# Patient Record
Sex: Female | Born: 1965
Health system: Southern US, Community
[De-identification: ages and names within clinical notes are randomized; demographics above are authoritative.]

## PROBLEM LIST (undated history)

## (undated) DIAGNOSIS — Q782 Osteopetrosis: Secondary | ICD-10-CM

## (undated) DIAGNOSIS — I10 Essential (primary) hypertension: Secondary | ICD-10-CM

## (undated) DIAGNOSIS — R011 Cardiac murmur, unspecified: Secondary | ICD-10-CM

## (undated) DIAGNOSIS — I209 Angina pectoris, unspecified: Secondary | ICD-10-CM

## (undated) DIAGNOSIS — Z8709 Personal history of other diseases of the respiratory system: Secondary | ICD-10-CM

## (undated) DIAGNOSIS — F329 Major depressive disorder, single episode, unspecified: Secondary | ICD-10-CM

## (undated) DIAGNOSIS — F419 Anxiety disorder, unspecified: Secondary | ICD-10-CM

## (undated) DIAGNOSIS — F32A Depression, unspecified: Secondary | ICD-10-CM

## (undated) DIAGNOSIS — K219 Gastro-esophageal reflux disease without esophagitis: Secondary | ICD-10-CM

## (undated) DIAGNOSIS — R911 Solitary pulmonary nodule: Secondary | ICD-10-CM

## (undated) HISTORY — DX: Depression, unspecified: F32.A

## (undated) HISTORY — DX: Osteopetrosis: Q78.2

## (undated) HISTORY — DX: Anxiety disorder, unspecified: F41.9

## (undated) HISTORY — DX: Solitary pulmonary nodule: R91.1

## (undated) HISTORY — DX: Major depressive disorder, single episode, unspecified: F32.9

---

## 1999-04-30 ENCOUNTER — Emergency Department (HOSPITAL_COMMUNITY): Admission: EM | Admit: 1999-04-30 | Discharge: 1999-04-30 | Payer: Self-pay | Admitting: Emergency Medicine

## 2000-01-11 ENCOUNTER — Emergency Department (HOSPITAL_COMMUNITY): Admission: EM | Admit: 2000-01-11 | Discharge: 2000-01-12 | Payer: Self-pay | Admitting: Emergency Medicine

## 2000-01-16 ENCOUNTER — Encounter: Payer: Self-pay | Admitting: Family Medicine

## 2000-01-16 ENCOUNTER — Encounter: Admission: RE | Admit: 2000-01-16 | Discharge: 2000-01-16 | Payer: Self-pay | Admitting: Family Medicine

## 2005-11-26 HISTORY — PX: ABDOMINAL HYSTERECTOMY: SHX81

## 2006-11-06 ENCOUNTER — Ambulatory Visit (HOSPITAL_COMMUNITY): Admission: RE | Admit: 2006-11-06 | Discharge: 2006-11-07 | Payer: Self-pay | Admitting: Obstetrics and Gynecology

## 2006-11-06 ENCOUNTER — Encounter (INDEPENDENT_AMBULATORY_CARE_PROVIDER_SITE_OTHER): Payer: Self-pay | Admitting: *Deleted

## 2009-01-09 ENCOUNTER — Emergency Department (HOSPITAL_BASED_OUTPATIENT_CLINIC_OR_DEPARTMENT_OTHER): Admission: EM | Admit: 2009-01-09 | Discharge: 2009-01-10 | Payer: Self-pay | Admitting: Emergency Medicine

## 2009-01-10 ENCOUNTER — Ambulatory Visit: Payer: Self-pay | Admitting: Diagnostic Radiology

## 2009-01-19 ENCOUNTER — Ambulatory Visit: Payer: Self-pay | Admitting: Diagnostic Radiology

## 2009-01-19 ENCOUNTER — Ambulatory Visit (HOSPITAL_BASED_OUTPATIENT_CLINIC_OR_DEPARTMENT_OTHER): Admission: RE | Admit: 2009-01-19 | Discharge: 2009-01-19 | Payer: Self-pay | Admitting: Family Medicine

## 2009-11-26 HISTORY — PX: LAPAROSCOPIC GASTRIC BANDING: SHX1100

## 2011-03-13 LAB — CBC
HCT: 39.8 % (ref 36.0–46.0)
Hemoglobin: 13.3 g/dL (ref 12.0–15.0)
MCHC: 33.3 g/dL (ref 30.0–36.0)
MCV: 87.1 fL (ref 78.0–100.0)
Platelets: 296 10*3/uL (ref 150–400)
RBC: 4.57 MIL/uL (ref 3.87–5.11)
RDW: 12.2 % (ref 11.5–15.5)
WBC: 5.3 10*3/uL (ref 4.0–10.5)

## 2011-03-13 LAB — BASIC METABOLIC PANEL
BUN: 12 mg/dL (ref 6–23)
CO2: 29 mEq/L (ref 19–32)
Calcium: 9 mg/dL (ref 8.4–10.5)
Chloride: 104 mEq/L (ref 96–112)
Creatinine, Ser: 0.6 mg/dL (ref 0.4–1.2)
GFR calc Af Amer: >60 mL/min (ref >60)
GFR calc non Af Amer: >60 mL/min (ref >60)
Glucose, Bld: 98 mg/dL (ref 70–99)
Potassium: 4 mEq/L (ref 3.5–5.1)
Sodium: 140 mEq/L (ref 135–145)

## 2011-03-13 LAB — DIFFERENTIAL
Basophils Absolute: 0.1 K/uL (ref 0.0–0.1)
Basophils Relative: 1 % (ref 0–1)
Eosinophils Absolute: 0.1 10*3/uL (ref 0.0–0.7)
Eosinophils Relative: 3 % (ref 0–5)
Lymphocytes Relative: 32 % (ref 12–46)
Lymphs Abs: 1.7 10*3/uL (ref 0.7–4.0)
Monocytes Absolute: 0.8 K/uL (ref 0.1–1.0)
Monocytes Relative: 15 % — ABNORMAL HIGH (ref 3–12)
Neutro Abs: 2.6 K/uL (ref 1.7–7.7)
Neutrophils Relative %: 49 % (ref 43–77)

## 2011-03-13 LAB — POCT CARDIAC MARKERS
CKMB, poc: 2.1 ng/mL (ref 1.0–8.0)
Myoglobin, poc: 56.2 ng/mL (ref 12–200)
Troponin i, poc: <0.05 ng/mL (ref 0.00–0.09)

## 2011-03-13 LAB — D-DIMER, QUANTITATIVE: D-Dimer, Quant: 0.29 ug{FEU}/mL (ref 0.00–0.48)

## 2011-04-13 NOTE — Op Note (Signed)
NAME:  Debra Carpenter, Debra Carpenter NO.:  [id number]   MEDICAL RECORD NO.:  [id number]          PATIENT TYPE:  AMB   LOCATION:  SDC                           FACILITY:  [facility]   PHYSICIAN:  Lenoard Aden, M.D.DATE OF BIRTH:  05-06-1966   DATE OF PROCEDURE:  11/06/2006  DATE OF DISCHARGE:                               OPERATIVE REPORT   PREOPERATIVE DIAGNOSIS:  Dysmenorrhea and menorrhagia, symptomatic  uterine fibroids.   POSTOPERATIVE DIAGNOSIS:  Dysmenorrhea and menorrhagia, symptomatic  uterine fibroids, enterocele, pelvic endometriosis.   OPERATION PERFORMED:  Diagnostic laparoscopy, ablation of cul-de-sac  endometriosis, laparoscopically assisted vaginal hysterectomy, McCall  culdoplasty.   SURGEON:  Lenoard Aden, M.D.   ANESTHESIA:  General by Raul Del, M.D.   ASSESSMENT:  Debra Carpenter. [name], M.D.   ESTIMATED BLOOD LOSS:  300 mL.   COMPLICATIONS:  None.   SPECIMENS:  Uterus and cervix to pathology.   DESCRIPTION OF PROCEDURE:  After being apprised of risks of anesthesia  to include bleeding, injury to abdominal organs, need for repair,  delayed versus immediate complications to include bowel or bladder  injury, inability to perform single procedure laparoscopically with  possible need for abdominal hysterectomy, she was brought to the  operating room where she was administered general anesthetic without  complications.  Feet were placed in the yellow fin stirrups without  difficulty.  Prepped and draped in the usual sterile fashion.  The RUMI  retractor was placed without difficulty and the occlusion balloon is  inflated.  At this time infraumbilical incision made with a scalpel.  Veress needle placed.  Opening pressure __________ 4 L CO2 insufflated  without difficulty.  Trocar placed atraumatically.  Atraumatic trocar  entry.  Visualized normal liver, gallbladder, normal appendiceal area.  Fibroid laden uterus noted.  Normal tubes and normal  ovaries.  At this  time two 5 mm ports were made in the midaxillary line suprapubically,  the right and left mid lower quadrant areas under direct visualization.  At this time the Gyrus was entered and the tubo-ovarian round ligaments  were bilaterally ligated using the Gyrus.  The bladder flap was  developed using sharp dissection and with the Gyrus, the posterior cul-  de-sac is laden with some pelvic endometriosis which was ablated using  bipolar cautery.  At this time the uterine vessels were bilaterally  skeletonized and ligated using the Gyrus and the anterior colpotomy  incision was made using a Gyrus spatula without difficulty in the  midline.  Posterior colpotomy incision was made as well.  Attention was  turned to the vaginal portion of the procedure where a weighted speculum  was placed.  Anterior and posterior lip of the cervix was grasped using  a Jacobs tenaculum.  The posterior colpotomy was extended and  uterosacral ligaments bilaterally grasped and suture ligated, transfixed  to the vaginal cuff.  The Ligasure was used to ligate the cardinal and  broad ligament complexes.  The specimen was then removed and weight was  greater than 250 gm.  At this time irrigation was accomplished and the  enterocele was  identified.  Internal and external McCall's culdoplasty  suture was placed and tied.  Uterosacral ligaments were plicated in the  midline.  Vaginal cuff was closed side-to-side using 0 Vicryl pop-off  sutures.  Good hemostasis was noted.  Attention was turned to the  abdominal portion of the procedure whereby visualization reveals well  approximated vaginal cuff.  No evidence of any bleeding.  The ureters  were seen to be peristalsing bilaterally.  Two normal ovaries were  identified.  At this time the procedure was terminated.  CO2 was  released.  All instruments were removed under direct visualization.  Incisions were closed using 0 Vicryl and Dermabond.  The patient   tolerated the procedure well and was transferred to recovery in good  condition.      Lenoard Aden, M.D.  Electronically Signed     RJT/MEDQ  D:  11/06/2006  T:  11/06/2006  Job:  [id number]

## 2011-04-13 NOTE — H&P (Signed)
NAME:  Debra Carpenter, Debra Carpenter NO.:  [id number]   MEDICAL RECORD NO.:  [id number]          PATIENT TYPE:  AMB   LOCATION:  SDC                           [facility]ILITY[facility]   PHYSICIAN:  Lenoard Aden, M.D.DATE OF BIRTH:  04-23-1966   DATE OF ADMISSION:  11/06/2006  DATE OF DISCHARGE:                              HISTORY & PHYSICAL   CHIEF COMPLAINT:  Dysmenorrhea/menorrhagia refractory to medication.   She is a 45 year old African American female G3, P3 who presents with  symptomatic fibroids, menorrhagia and dysmenorrhea for definitive  therapy.  She has no known drug allergies.  Nonsmoker, nondrinker.  Denies domestic physical violence.  Family history of diabetes, heart  disease, hypertension, ovarian cancer and colon cancer.  She denies  domestic physical violence.   PHYSICAL EXAM:  She is a well-developed, well-nourished [place] American  female in no acute distress.  HEENT:  Normal.  LUNGS:  Clear.  HEART:  Regular rate and rhythm.  ABDOMEN:  Soft and nontender.  PELVIC EXAM:  Reveals a bulky midposition uterus.  No adnexal masses.  EXTREMITIES:  No cords.  NEUROLOGICAL:  Nonfocal.   IMPRESSION:  Symptomatic uterine fibroids with menorrhagia,  dysmenorrhea.  To proceed with definitive therapy.   PLAN:  Proceed with LAVH versus TAH.  Ovarian conservation discussed.  Risks of anesthesia, infection, bleeding, injury to abdominal organs,  need for repair noted.  Delayed versus immediate complications to  include bowel and bladder injury discussed.  The patient acknowledges  and wishes to proceed.      Lenoard Aden, M.D.  Electronically Signed     RJT/MEDQ  D:  11/05/2006  T:  11/05/2006  Job:  [id number]   cc:   Ma Hillock

## 2011-07-28 DIAGNOSIS — I209 Angina pectoris, unspecified: Secondary | ICD-10-CM

## 2011-07-28 HISTORY — DX: Angina pectoris, unspecified: I20.9

## 2011-08-14 ENCOUNTER — Emergency Department (INDEPENDENT_AMBULATORY_CARE_PROVIDER_SITE_OTHER): Payer: BC Managed Care – PPO

## 2011-08-14 ENCOUNTER — Inpatient Hospital Stay (HOSPITAL_COMMUNITY)
Admission: AD | Admit: 2011-08-14 | Discharge: 2011-08-16 | DRG: 102 | Disposition: A | Discharge status: Home / Self Care | Payer: BC Managed Care – PPO | Source: Other Acute Inpatient Hospital | Attending: Family Medicine | Admitting: Family Medicine

## 2011-08-14 ENCOUNTER — Emergency Department (HOSPITAL_BASED_OUTPATIENT_CLINIC_OR_DEPARTMENT_OTHER)
Admission: EM | Admit: 2011-08-14 | Discharge: 2011-08-14 | Disposition: A | Discharge status: Other Acute Inpatient Hospital | Payer: BC Managed Care – PPO | Source: Home / Self Care | Attending: Emergency Medicine | Admitting: Emergency Medicine

## 2011-08-14 ENCOUNTER — Other Ambulatory Visit: Payer: Self-pay

## 2011-08-14 ENCOUNTER — Encounter: Payer: Self-pay | Admitting: *Deleted

## 2011-08-14 DIAGNOSIS — K219 Gastro-esophageal reflux disease without esophagitis: Secondary | ICD-10-CM | POA: Insufficient documentation

## 2011-08-14 DIAGNOSIS — R791 Abnormal coagulation profile: Secondary | ICD-10-CM

## 2011-08-14 DIAGNOSIS — R079 Chest pain, unspecified: Secondary | ICD-10-CM

## 2011-08-14 DIAGNOSIS — R0789 Other chest pain: Secondary | ICD-10-CM | POA: Diagnosis present

## 2011-08-14 DIAGNOSIS — M19019 Primary osteoarthritis, unspecified shoulder: Secondary | ICD-10-CM | POA: Diagnosis present

## 2011-08-14 DIAGNOSIS — M25819 Other specified joint disorders, unspecified shoulder: Secondary | ICD-10-CM | POA: Diagnosis present

## 2011-08-14 DIAGNOSIS — R599 Enlarged lymph nodes, unspecified: Secondary | ICD-10-CM | POA: Insufficient documentation

## 2011-08-14 DIAGNOSIS — E669 Obesity, unspecified: Secondary | ICD-10-CM | POA: Diagnosis present

## 2011-08-14 DIAGNOSIS — Z9884 Bariatric surgery status: Secondary | ICD-10-CM

## 2011-08-14 DIAGNOSIS — I498 Other specified cardiac arrhythmias: Secondary | ICD-10-CM | POA: Diagnosis present

## 2011-08-14 DIAGNOSIS — R030 Elevated blood-pressure reading, without diagnosis of hypertension: Secondary | ICD-10-CM | POA: Diagnosis present

## 2011-08-14 DIAGNOSIS — M67919 Unspecified disorder of synovium and tendon, unspecified shoulder: Secondary | ICD-10-CM | POA: Diagnosis present

## 2011-08-14 DIAGNOSIS — M719 Bursopathy, unspecified: Secondary | ICD-10-CM | POA: Diagnosis present

## 2011-08-14 DIAGNOSIS — R591 Generalized enlarged lymph nodes: Secondary | ICD-10-CM

## 2011-08-14 DIAGNOSIS — R0602 Shortness of breath: Secondary | ICD-10-CM

## 2011-08-14 DIAGNOSIS — Z79899 Other long term (current) drug therapy: Secondary | ICD-10-CM

## 2011-08-14 DIAGNOSIS — M940 Chondrocostal junction syndrome [Tietze]: Principal | ICD-10-CM | POA: Diagnosis present

## 2011-08-14 DIAGNOSIS — M899 Disorder of bone, unspecified: Secondary | ICD-10-CM | POA: Insufficient documentation

## 2011-08-14 HISTORY — DX: Gastro-esophageal reflux disease without esophagitis: K21.9

## 2011-08-14 HISTORY — DX: Essential (primary) hypertension: I10

## 2011-08-14 LAB — CARDIAC PANEL(CRET KIN+CKTOT+MB+TROPI)
CK, MB: 3 ng/mL (ref 0.3–4.0)
CK, MB: 2.3 ng/mL (ref 0.3–4.0)
Relative Index: 2.4 (ref 0.0–2.5)
Relative Index: 2.1 (ref 0.0–2.5)
Total CK: 117 U/L (ref 7–177)
Total CK: 111 U/L (ref 7–177)
Troponin I: <0.3 ng/mL (ref <0.30)

## 2011-08-14 LAB — BASIC METABOLIC PANEL
BUN: 14 mg/dL (ref 6–23)
CO2: 27 mEq/L (ref 19–32)
Chloride: 102 mEq/L (ref 96–112)
Creatinine, Ser: 0.5 mg/dL (ref 0.50–1.10)
Glucose, Bld: 104 mg/dL — ABNORMAL HIGH (ref 70–99)
Potassium: 3.8 mEq/L (ref 3.5–5.1)

## 2011-08-14 LAB — CBC
HCT: 37.4 % (ref 36.0–46.0)
Hemoglobin: 12.8 g/dL (ref 12.0–15.0)
MCV: 84.8 fL (ref 78.0–100.0)
WBC: 5.9 10*3/uL (ref 4.0–10.5)

## 2011-08-14 LAB — D-DIMER, QUANTITATIVE: D-Dimer, Quant: 0.89 ug/mL-FEU — ABNORMAL HIGH (ref 0.00–0.48)

## 2011-08-14 LAB — CEA: CEA: 0.5 ng/mL (ref 0.0–5.0)

## 2011-08-14 LAB — HEMOGLOBIN A1C: Mean Plasma Glucose: 114 mg/dL (ref <117)

## 2011-08-14 MED ORDER — NITROGLYCERIN 2 % TD OINT
0.5000 [in_us] | TOPICAL_OINTMENT | Freq: Once | TRANSDERMAL | Status: AC
  Administered 2011-08-14: 0.5 [in_us] via TOPICAL
  Filled 2011-08-14: qty 1

## 2011-08-14 MED ORDER — KETOROLAC TROMETHAMINE 30 MG/ML IJ SOLN
30.0000 mg | Freq: Once | INTRAMUSCULAR | Status: AC
  Administered 2011-08-14: 30 mg via INTRAVENOUS
  Filled 2011-08-14: qty 1

## 2011-08-14 MED ORDER — SODIUM CHLORIDE 0.9 % IV BOLUS (SEPSIS)
500.0000 mL | Freq: Once | INTRAVENOUS | Status: AC
  Administered 2011-08-14: 500 mL via INTRAVENOUS

## 2011-08-14 MED ORDER — NITROGLYCERIN 0.4 MG SL SUBL
0.4000 mg | SUBLINGUAL_TABLET | SUBLINGUAL | Status: DC | PRN
  Administered 2011-08-14 (×2): 0.4 mg via SUBLINGUAL
  Filled 2011-08-14: qty 25

## 2011-08-14 MED ORDER — GI COCKTAIL ~~LOC~~
30.0000 mL | Freq: Once | ORAL | Status: AC
  Administered 2011-08-14: 30 mL via ORAL
  Filled 2011-08-14: qty 30

## 2011-08-14 MED ORDER — IOHEXOL 350 MG/ML SOLN
80.0000 mL | Freq: Once | INTRAVENOUS | Status: AC | PRN
  Administered 2011-08-14: 80 mL via INTRAVENOUS

## 2011-08-14 MED ORDER — ASPIRIN 81 MG PO CHEW
324.0000 mg | CHEWABLE_TABLET | Freq: Once | ORAL | Status: AC
  Administered 2011-08-14: 324 mg via ORAL
  Filled 2011-08-14: qty 4

## 2011-08-14 MED FILL — Nitroglycerin SL Tab 0.4 MG: SUBLINGUAL | Qty: 25 | Status: AC

## 2011-08-14 NOTE — ED Provider Notes (Addendum)
History     CSN: [id number] Arrival date & time: 08/14/2011  2:48 AM   Chief Complaint  Patient presents with  . Chest Pain     (Include location/radiation/quality/duration/timing/severity/associated sxs/prior treatment) Patient is a 45 y.o. Carpenter presenting with chest pain. The history is provided by the patient. No language interpreter was used.  Chest Pain The chest pain began 3 - 5 hours ago. Duration of episode(s) is 3 hours. Chest pain occurs constantly. The chest pain is improving. Associated with: none, patient was watching TV when it started. At its most intense, the pain is at 10/10. The pain is currently at 8/10. The severity of the pain is severe. The quality of the pain is described as tightness. The pain radiates to the left neck and right neck. Exacerbated by: nothing. Primary symptoms include shortness of breath, cough, nausea and dizziness. Pertinent negatives for primary symptoms include no fever, no fatigue, no syncope, no wheezing, no palpitations, no abdominal pain and no altered mental status.  Dizziness also occurs with nausea and diaphoresis. Dizziness does not occur with weakness.   Associated symptoms include diaphoresis.  Pertinent negatives for associated symptoms include no claudication, no lower extremity edema, no near-syncope, no numbness, no orthopnea, no paroxysmal nocturnal dyspnea and no weakness. She tried nothing for the symptoms. Risk factors include obesity and post-menopausal (drove to [facility] for a 10 k this past weekend).  Pertinent negatives for past medical history include no aneurysm, no anxiety/panic attacks, no aortic aneurysm, no aortic dissection, no arrhythmia, no bicuspid aortic valve, no CAD, no cancer, no congenital heart disease, no connective tissue disease, no COPD, no CHF, no diabetes, no DVT, no hyperhomocysteinemia, no hyperlipidemia, no hypertension, no Kawasaki disease, no Marfan's syndrome, no MI, no mitral valve prolapse, no  pacemaker, no PE, no PVD, no recent injury, no rheumatic fever, no seizures, no sickle cell disease, no sleep apnea, no spontaneous pneumothorax, no stimulant use, no strokes, no thyroid problem, no TIA, Turner syndrome and no valve disorder.  Her family medical history is significant for CAD in family.  Pertinent negatives for family medical history include: family history of aortic dissection, no connective tissue disease in family, no early MI in family, no PE in family and no PVD in family.  Procedure history is negative for cardiac catheterization, echocardiogram, persantine thallium, stress echo, stress thallium and exercise treadmill test.      Past Medical History  Diagnosis Date  . GERD (gastroesophageal reflux disease)   . Hypertension      Past Surgical History  Procedure Date  . Laparoscopic gastric banding   . Abdominal hysterectomy     No family history on file.  History  Substance Use Topics  . Smoking status: Never Smoker   . Smokeless tobacco: Not on file  . Alcohol Use: No    OB History    Grav Para Term Preterm Abortions TAB SAB Ect Mult Living                  Review of Systems  Constitutional: Positive for diaphoresis. Negative for fever and fatigue.  HENT: Negative for facial swelling.   Eyes: Negative for discharge.  Respiratory: Positive for cough, chest tightness and shortness of breath. Negative for choking, wheezing and stridor.   Cardiovascular: Positive for chest pain. Negative for palpitations, orthopnea, claudication, syncope and near-syncope.  Gastrointestinal: Positive for nausea. Negative for abdominal pain.  Genitourinary: Negative for difficulty urinating.  Musculoskeletal: Negative for arthralgias.  Skin: Negative.  Neurological: Positive for dizziness. Negative for seizures, weakness and numbness.  Hematological: Negative for adenopathy.  Psychiatric/Behavioral: Negative for agitation and altered mental status.    Allergies    Review of patient's allergies indicates no known allergies.  Home Medications  No current outpatient prescriptions on file.  Physical Exam    BP 155/107  Pulse 58  Temp(Src) 97.9 F (36.6 C) (Oral)  Resp 18  Wt 265 lb (120.203 kg)  SpO2 100%  Physical Exam  Constitutional: She is oriented to person, place, and time. She appears well-developed and well-nourished. No distress.  HENT:  Head: Normocephalic and atraumatic.  Eyes: EOM are normal. Pupils are equal, round, and reactive to light. Right eye exhibits no discharge. Left eye exhibits no discharge.  Neck: Normal range of motion. Neck supple. No JVD present.  Cardiovascular: Normal rate and regular rhythm.  Exam reveals no gallop and no friction rub.   No murmur heard. Pulmonary/Chest: Effort normal and breath sounds normal. No stridor. No respiratory distress.  Abdominal: Soft. Bowel sounds are normal. There is no tenderness. There is no rebound and no guarding.  Musculoskeletal: Normal range of motion. She exhibits no edema and no tenderness.  Neurological: She is alert and oriented to person, place, and time.  Skin: Skin is warm and dry. She is not diaphoretic.  Psychiatric: She has a normal mood and affect.    ED Course  Procedures  Results for orders placed during the hospital encounter of 08/14/11  CBC      Component Value Range   WBC 5.9  4.0 - 10.5 (K/uL)   RBC 4.41  3.87 - 5.11 (MIL/uL)   Hemoglobin 12.8  12.0 - 15.0 (g/dL)   HCT 16.1  09.6 - 04.5 (%)   MCV Debra.8  78.0 - 100.0 (fL)   MCH 29.0  26.0 - 34.0 (pg)   MCHC 34.2  30.0 - 36.0 (g/dL)   RDW 40.9  81.1 - 91.4 (%)   Platelets 275  150 - 400 (K/uL)  BASIC METABOLIC PANEL      Component Value Range   Sodium 137  135 - 145 (mEq/L)   Potassium 3.8  3.5 - 5.1 (mEq/L)   Chloride 102  96 - 112 (mEq/L)   CO2 27  19 - 32 (mEq/L)   Glucose, Bld 104 (*) 70 - 99 (mg/dL)   BUN 14  6 - 23 (mg/dL)   Creatinine, Ser 7.82  0.50 - 1.10 (mg/dL)   Calcium 8.8  8.4  - 10.5 (mg/dL)   GFR calc non Af Amer >60  >60 (mL/min)   GFR calc Af Amer >60  >60 (mL/min)  CARDIAC PANEL(CRET KIN+CKTOT+MB+TROPI)      Component Value Range   Total CK 143  7 - 177 (U/L)   CK, MB 3.4  0.3 - 4.0 (ng/mL)   Troponin I <0.30  <0.30 (ng/mL)   Relative Index 2.4  0.0 - 2.5   D-DIMER, QUANTITATIVE      Component Value Range   D-Dimer, Quant 0.89 (*) 0.00 - 0.48 (ug/mL-FEU)   No results found.   No diagnosis found.   MDM  Date: 08/14/2011  Rate:58  Rhythm: sinus bradycardia  QRS Axis: normal  Intervals: normal  ST/T Wave abnormalities: normal  Conduction Disutrbances:none  Narrative Interpretation: sinus brady  Old EKG Reviewed: unchanged     Patient and family informed of CT chest finding of lymph nodes and sclerotic lesion in the C7 vertebrae and need for PET scan per radiology.  Patient and family verbalize understanding and agree to have follow up  Gisela Lea K Mitsue Peery-Rasch, MD 08/14/11 0509  Case d/w Dr. Angus Palms, CT results reviewed patient has lymphadenopathy and sclerotic C7 lesion and will need follow up PET scan. Expressed understanding of same  Kayle Correa K Shay Bartoli-Rasch, MD 08/14/11 [phone]

## 2011-08-14 NOTE — ED Notes (Signed)
[facility] arrived for transport. Consent signed.

## 2011-08-14 NOTE — ED Notes (Signed)
IV charted "removed" for documentation purposes only. SL remains intact to LAC.

## 2011-08-14 NOTE — ED Notes (Signed)
Report given to Peace RN on 4700.

## 2011-08-14 NOTE — ED Notes (Signed)
C/o midsternal chest tightness that began while sitting down at the computer. Associated with SOB/nausea/dizziness/diaphoresis. The pain the radiated up into her throat. Denies CP at this time but states the tightness is still in her throat. Denies any recent injury but states she did run a 10k 2days ago.

## 2011-08-14 NOTE — ED Notes (Signed)
Pt has hx of GERD. States she ate a meal that was fatty and fried

## 2011-08-15 ENCOUNTER — Inpatient Hospital Stay (HOSPITAL_COMMUNITY): Payer: BC Managed Care – PPO

## 2011-08-15 ENCOUNTER — Telehealth: Payer: Self-pay | Admitting: Cardiology

## 2011-08-15 DIAGNOSIS — I517 Cardiomegaly: Secondary | ICD-10-CM

## 2011-08-15 LAB — BASIC METABOLIC PANEL
BUN: 7 mg/dL (ref 6–23)
CO2: 27 mEq/L (ref 19–32)
GFR calc non Af Amer: >60 mL/min (ref >60)
Glucose, Bld: 97 mg/dL (ref 70–99)
Potassium: 3.4 mEq/L — ABNORMAL LOW (ref 3.5–5.1)
Sodium: 137 mEq/L (ref 135–145)

## 2011-08-15 LAB — CBC
HCT: 35 % — ABNORMAL LOW (ref 36.0–46.0)
MCHC: 34.3 g/dL (ref 30.0–36.0)
Platelets: 239 10*3/uL (ref 150–400)
RDW: 12.8 % (ref 11.5–15.5)

## 2011-08-15 LAB — LIPID PANEL
HDL: 57 mg/dL (ref >39)
LDL Cholesterol: 81 mg/dL (ref 0–99)
Triglycerides: 63 mg/dL (ref <150)
VLDL: 13 mg/dL (ref 0–40)

## 2011-08-15 LAB — DRUGS OF ABUSE SCREEN W/O ALC, ROUTINE URINE
Amphetamine Screen, Ur: NEGATIVE
Opiate Screen, Urine: NEGATIVE

## 2011-08-15 LAB — CARDIAC PANEL(CRET KIN+CKTOT+MB+TROPI): Total CK: 88 U/L (ref 7–177)

## 2011-08-15 NOTE — Telephone Encounter (Signed)
Spoke w/pt. States she was admitted by hospital doctor and a cardiologist is not seeing her. Wanted to know if Dr. Swaziland was aware that she was in hospital. Advised that Dr. [place]and has never seen her because she was a patient of Dr. Reyes Ivan in 2010. Also advised her that if the admitting physician feels that a cardiologist is needed will call [facility] since we are part of them. She verbalizes understanding.

## 2011-08-15 NOTE — Telephone Encounter (Signed)
Pt's sister states pt was seen at [facility] in [facility] and then transferred to [facility].  Pt's sister is concerned that the nurses/physicians are not consulting with Dr. Sw[place]and and don't know what's going on.  The sister is with pt at the hospital and has questions.  Pt's sister states they keep giving pt Vicodin.  Please call back to field medical questions if possible in room # [phone]  or at cell # [phone]   Chart in box.

## 2011-08-17 ENCOUNTER — Ambulatory Visit: Payer: BC Managed Care – PPO | Admitting: Family

## 2011-08-22 ENCOUNTER — Encounter: Payer: Self-pay | Admitting: Family

## 2011-08-22 ENCOUNTER — Ambulatory Visit (HOSPITAL_BASED_OUTPATIENT_CLINIC_OR_DEPARTMENT_OTHER)
Admission: RE | Admit: 2011-08-22 | Discharge: 2011-08-22 | Disposition: A | Discharge status: Home / Self Care | Payer: BC Managed Care – PPO | Source: Ambulatory Visit | Attending: Family | Admitting: Family

## 2011-08-22 ENCOUNTER — Ambulatory Visit (INDEPENDENT_AMBULATORY_CARE_PROVIDER_SITE_OTHER): Payer: BC Managed Care – PPO | Admitting: Family

## 2011-08-22 DIAGNOSIS — Z0000 Encounter for general adult medical examination without abnormal findings: Secondary | ICD-10-CM

## 2011-08-22 DIAGNOSIS — F329 Major depressive disorder, single episode, unspecified: Secondary | ICD-10-CM

## 2011-08-22 DIAGNOSIS — Q782 Osteopetrosis: Secondary | ICD-10-CM

## 2011-08-22 DIAGNOSIS — F32A Depression, unspecified: Secondary | ICD-10-CM | POA: Insufficient documentation

## 2011-08-22 DIAGNOSIS — Z1231 Encounter for screening mammogram for malignant neoplasm of breast: Secondary | ICD-10-CM | POA: Insufficient documentation

## 2011-08-22 DIAGNOSIS — R0789 Other chest pain: Secondary | ICD-10-CM

## 2011-08-22 HISTORY — DX: Osteopetrosis: Q78.2

## 2011-08-22 MED ORDER — CALCIUM CARBONATE-VITAMIN D 600-400 MG-UNIT PO TABS: 1.0000 | ORAL_TABLET | Freq: Two times a day (BID) | ORAL | Status: DC

## 2011-08-22 NOTE — Assessment & Plan Note (Signed)
Pt was counseled on diet, exercise and weight loss.   She is up to date on her GYN exam, will order mammogram.  Fasting lab work reviewed from the hospital and is stable.  Offered flu shot, pt declines.

## 2011-08-22 NOTE — Progress Notes (Signed)
Subjective:    Patient ID: Debra Carpenter, female    DOB: [birth year], 45 y.o.   MRN: [id number]  HPI  Debra Carpenter is a 45 yr old female new to establish care. Was hospitalized at [facility] 9/19-9/21 for atypical chest pain.  Work up included a CTA chest which was negative for PE.  She was incidentally noted to have a sclerotic lesion on C7 for which a PET scan was scheduled.    She was also noted to have supraspinatus and infraspinatus tendinopathy and was evaluated by Dr. Shon Baton while hospitalized.    Depression- She reports that this is well controlled, she has been taking for the last 2 months.   Mom is [employer].   Preventative- runs 4-5 miles several days a week. Diet "ok". Notes that she eats infrequently throughout the day.  She reports that she is up to date on her tetanus.  Declines flu shot.   Review of Systems  Constitutional: Negative for fever and unexpected weight change.  HENT: Negative for hearing loss.   Eyes: Negative for visual disturbance.  Respiratory: Negative for shortness of breath.   Cardiovascular: Negative for chest pain and palpitations.  Gastrointestinal: Negative for nausea, vomiting and diarrhea.  Genitourinary: Negative for dysuria.  Musculoskeletal: Positive for back pain. Negative for arthralgias.  Skin: Negative for rash.  Neurological: Negative for headaches.  Hematological: Negative for adenopathy.  Psychiatric/Behavioral:       See HPI   Past Medical History  Diagnosis Date  . GERD (gastroesophageal reflux disease)   . Hypertension   . Anxiety and depression     History   Social History  . Marital Status: Married    Spouse Name: N/A    Number of Children: 3  . Years of Education: N/A   Occupational History  .  [employer]   Social History Main Topics  . Smoking status: Never Smoker   . Smokeless tobacco: Not on file  . Alcohol Use: No  . Drug Use: No  . Sexually Active: Not Currently   Other Topics Concern  . 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  3 children- 33 daughter, 83 son, 4 daughter    Past Surgical History  Procedure Date  . Laparoscopic gastric banding   . Abdominal hysterectomy 2007    Family History  Problem Relation Age of Onset  . Hypertension Mother   . Hypertension Father   . Cancer Father     ? colon cancer  . Cancer Maternal Grandmother     ovarian cancer  . Hypertension Maternal Grandfather   . Hypertension Paternal Grandfather     No Known Allergies  No current outpatient prescriptions on file prior to visit.    BP 112/88  Pulse 72  Temp(Src) 97.8 F (36.6 C) (Oral)  Resp 16  Ht 5\' 9"  (1.753 m)  Wt 268 lb 0.6 oz (121.582 kg)  BMI 39.58 kg/m2       Objective:   Physical Exam  Constitutional: She is oriented to person, place, and time. She appears well-developed and well-nourished.  HENT:  Head: Normocephalic and atraumatic.  Eyes: Conjunctivae are normal. Pupils are equal, round, and reactive to light.  Neck: Normal range of motion. Neck supple. No thyromegaly present.  Cardiovascular: Normal rate and regular rhythm.   Pulmonary/Chest: Effort normal and breath sounds normal. No respiratory distress. She  has no wheezes. She has no rales. She exhibits no tenderness.  Abdominal: Soft. Bowel sounds are normal. She exhibits no distension and no mass. There is no tenderness. There is no rebound and no guarding.  Musculoskeletal: Normal range of motion. She exhibits no edema.  Neurological: She is alert and oriented to person, place, and time. She displays normal reflexes. No cranial nerve deficit. Coordination normal.  Skin: Skin is warm and dry. No rash noted. No erythema. No pallor.  Psychiatric: She has a normal mood and affect. Her behavior is normal. Judgment and thought content normal.          Assessment & Plan:

## 2011-08-22 NOTE — Patient Instructions (Addendum)
Keep your calories to 1200 a day. Continue your regular exercise. Schedule your mammogram on the first floor. Welcome to [facility]!

## 2011-08-22 NOTE — Assessment & Plan Note (Signed)
She reports that this was left anterior chest wall tenderness which has resolved with NSAIDS. Also, she denies current left shoulder pain.  Hospitalization record was reviewed and cardiac enzymes were negative.  Work up was consistent with costrochondritis.

## 2011-08-22 NOTE — Assessment & Plan Note (Signed)
This is stable, continue prozac.

## 2011-08-22 NOTE — Assessment & Plan Note (Signed)
Incidental finding on CTA chest of C7 sclerosis and mildly prominent hilar lymph nodes.  She is scheduled for a PET scan tomorrow and I have advised her to keep this appointment.

## 2011-08-23 ENCOUNTER — Encounter (HOSPITAL_COMMUNITY)
Admit: 2011-08-23 | Discharge: 2011-08-23 | Disposition: A | Discharge status: Home / Self Care | Payer: BC Managed Care – PPO | Attending: Internal Medicine | Admitting: Internal Medicine

## 2011-08-23 ENCOUNTER — Encounter (HOSPITAL_COMMUNITY): Payer: Self-pay

## 2011-08-23 DIAGNOSIS — M5030 Other cervical disc degeneration, unspecified cervical region: Secondary | ICD-10-CM | POA: Insufficient documentation

## 2011-08-23 DIAGNOSIS — J984 Other disorders of lung: Secondary | ICD-10-CM | POA: Insufficient documentation

## 2011-08-23 DIAGNOSIS — M5137 Other intervertebral disc degeneration, lumbosacral region: Secondary | ICD-10-CM | POA: Insufficient documentation

## 2011-08-23 DIAGNOSIS — N9489 Other specified conditions associated with female genital organs and menstrual cycle: Secondary | ICD-10-CM | POA: Insufficient documentation

## 2011-08-23 DIAGNOSIS — M51379 Other intervertebral disc degeneration, lumbosacral region without mention of lumbar back pain or lower extremity pain: Secondary | ICD-10-CM | POA: Insufficient documentation

## 2011-08-23 DIAGNOSIS — Z90710 Acquired absence of both cervix and uterus: Secondary | ICD-10-CM | POA: Insufficient documentation

## 2011-08-23 DIAGNOSIS — R599 Enlarged lymph nodes, unspecified: Secondary | ICD-10-CM | POA: Insufficient documentation

## 2011-08-23 LAB — GLUCOSE, CAPILLARY: Glucose-Capillary: 98 mg/dL (ref 70–99)

## 2011-08-23 MED ORDER — FLUDEOXYGLUCOSE F - 18 (FDG) INJECTION
15.6000 | Freq: Once | INTRAVENOUS | Status: AC | PRN
  Administered 2011-08-23: 15.6 via INTRAVENOUS

## 2011-08-23 NOTE — H&P (Signed)
Debra Carpenter, Debra Carpenter NO.:  [id number]  MEDICAL RECORD NO.:  [id number]  LOCATION:  4733                         FACILITY:  [facility]  PHYSICIAN:  Bernadette Hoit, MD     DATE OF BIRTH:  12/02/1965  DATE OF ADMISSION:  08/14/2011 DATE OF DISCHARGE:                             HISTORY & PHYSICAL   PRIMARY CARE PHYSICIAN:  Bernadette Hoit, MD  CHIEF COMPLAINT:  Chest pain.  HISTORY OF PRESENT ILLNESS:  This is a 45 year old with no significant past medical history who presented to the emergency department complaining of chest pain.  The pain has started the morning of admission around 1 a.m.  She described the pain as a tightness and pressure on her chair, squeezing in quality.  She was given GI cocktail that did not improved significantly her pain.  Subsequently she was given nitroglycerin x2 which relieved her chest pain.  She denies any chest pain or shortness of breath on exertion.  She had a prior episode of chest pain but not as severe that is resolved by its own.  She was sitting in her bed when she started to have the chest pain.  No fevers, chills, night sweats, weight loss.  She related that on Sunday she ran a 10k, she did not have any symptoms.  ALLERGIES:  No known drug allergies.  PAST MEDICAL HISTORY:  None.  PAST SURGICAL HISTORY:  Hysterectomy, lap band.  SOCIAL HISTORY:  Denies smoking alcohol or recreational drugs.  She is a Office manager.  She is married.  She has 3 children, 24, 37, and 92 years old.  Her mother has history of coronary artery disease and had 3 stent, first stent was when she was 73 years old.  Her father died of colon cancer.  He was diagnosed at 67 years old.  Her grandmother has history of ovarian cancer.  REVIEW OF SYSTEMS:  No cough.  No fever.  No chills.  No melena.  No hematochezia.  PHYSICAL EXAMINATION:  VITAL SIGNS:  Blood pressure 143/75, respirations 16, pulse 59, temperature 98.6, sat  98 on room air. GENERAL:  Obese in no acute distress. HEENT:  Head atraumatic, normocephalic.  Eyes anicteric.  Pupils equal and reactive to light.  Extraocular muscles intact. CARDIOVASCULAR:  S1 and S2, regular rhythm and rate.  No rubs, murmurs or gallops. LUNGS:  Bilateral good air movement.  No wheezing, crackles or rhonchi. ABDOMEN:  Bowel sounds present, soft, nontender, nondistended.  No rigidity. EXTREMITIES:  No edema. NEURO:  Nonfocal. BREAST:  No mass palpated.  ADMISSION LABORATORY FINDINGS:  Hemoglobin 12.8, white blood cell 5.9, platelets 275.  Sodium 137, potassium 3.8, chloride 102, bicarb 27, glucose 104, BUN 14, creatinine 0.50.  First set of cardiac enzymes, troponin 0.30, CK-MB 3.4.  EKG, sinus bradycardia.  CT show no pulmonary embolism or acute intrathoracic process.  She has a mildly prominent right hilar and subcarinal lymph node.  A sclerotic appearance  of C7 vertebral bodies.  This findings are nonspecific with benign and aggressive condition including the differential.  The diagnosis at this time must be excluded are metastatic disease and lymphoma.  Therefore a followup chest  CT is recommended.  ASSESSMENT AND PLAN: 1. Chest pain.  The patient was admitted to rule out acute coronary     syndrome.  We will cycle cardiac enzymes, EKG.  We will check 2-D     echo, rate is 35, fasting lipid panel, hemoglobin A1c.     Nitroglycerin p.r.n., aspirin.  I will start Protonix incase that     is GI source of her chest pain. 2. Incidental finding of CT with mildly prominent subcarinal node and     hilar lymph node and a sclerotic appearance of C7 vertebral body.     The patient will need a workup to rule out malignancy.  We will     order a CEA and an ACE level.  I will guaiac stool.  She will need     a screening appropriate for age.  She will need mammogram,     colonoscopy.  She has history of colon cancer in her family.  She     will need also a PET scan.   The patient is aware of CT results and     she understood that she needs to have a workup to rule out     malignancy. 3. Hypertension.  We will order p.r.n. hydralazine.  Monitor blood     pressure and consider medication if blood pressure continued to be     elevated.  She did not have any prior history of hypertension.     Hartley Barefoot, MD   ______________________________ Bernadette Hoit, MD    BR/MEDQ  D:  08/14/2011  T:  08/15/2011  Job:  [id number]  cc:   Bernadette Hoit, MD  Electronically Signed by Hartley Barefoot MD on 08/23/2011 08:12:18 PM

## 2011-08-24 ENCOUNTER — Telehealth: Payer: Self-pay | Admitting: Family

## 2011-08-24 ENCOUNTER — Encounter: Payer: Self-pay | Admitting: Family

## 2011-08-24 DIAGNOSIS — R911 Solitary pulmonary nodule: Secondary | ICD-10-CM

## 2011-08-24 DIAGNOSIS — Q782 Osteopetrosis: Secondary | ICD-10-CM

## 2011-08-24 HISTORY — DX: Solitary pulmonary nodule: R91.1

## 2011-08-24 NOTE — Assessment & Plan Note (Signed)
Had PET scan 9/12- neg for hypermetabolic activity.

## 2011-08-24 NOTE — Telephone Encounter (Signed)
Reviewed PET scan, neg for hypermetabolic activity.  Called pt, reviewed PET scan results. Discussed lung nodule. She will need follow up CT chest in 3-6 mos.

## 2011-08-27 NOTE — Discharge Summary (Signed)
Debra Carpenter, Debra Carpenter NO.:  [id number]  MEDICAL RECORD NO.:  [id number]  LOCATION:                                 FACILITY:  PHYSICIAN:  Pleas Koch, MD        DATE OF BIRTH:  1966-08-01  DATE OF ADMISSION:  08/15/2011 DATE OF DISCHARGE:  08/17/2011                              DISCHARGE SUMMARY   CURRENT FACILITY:  [facility].  DISCHARGE DIAGNOSES: 1. Noncardiogenic chest pain. 2. Likely costochondritis. 3. Rotator cuff impingement. 4. Acromioclavicular joint arthritis and bursitis. 5. Supraspinatus and infraspinatus tendinopathy, worse in the aspect     of the supraspinatus, no focal tear seen. 6. Elevated blood pressure without a diagnosis of hypertension.     Actually, the patient now has stage II hypertension. 7. CT abnormality noted. 8. Mildly prominent right hilar and subcarinal lymph nodes - scheduled     for follow-up CT scan August 23, 2011.  DISCHARGE MEDICATIONS: 1. Zofran 4 mg one tablet q.6 hourly p.r.n., 30 tablets. 2. MiraLax 17 grams daily. 3. Robaxin 500 mg one tablet t.i.d. 4. Percocet one tablet q.6 hourly, quantity sufficient for one tablet     q.6 hourly p.r.n. for pain 5/325, 30 tablets prescribed. 5. Aleve has been discontinued from [facility]. 6. Ibuprofen 600 mg one tablet t.i.d., 24 tablets prescribed. 7. Zyrtec 5 mg one tablet daily.  PERTINENT IMAGING STUDIES: 1. Chest x-ray, two-view, August 14, 2011 showed no acute     cardiopulmonary process. 2. CT angiogram of chest showed;     a.     No acute pulmonary embolism or acute intrathoracic process.     b.     Mildly prominent right hilar subcarinal lymph nodes with      sclerotic appearance of C7.     c.     These findings are nonspecific with benign aggressive      conditions include in differential, think they must be excluded      are metastatic disease and lymphoma.  Therefore, PET scan has been      scheduled. 3. Left shoulder x-ray, August 15, 2011  showed negative. 4. MRI shoulder showed;     a.     Supraspinatus and infraspinatus tendinopathy, changes      appeared to be worsened, anterior aspect of supraspinatus, but no      focal tear seen.     b.     Glenohumeral degenerative disease.  Paralabral cyst along      the posterior marginal glenoid indicative of labral tear, although      no tears seen on the study.     c.     Acromioclavicular osteoarthritis creating some mass effect      on supraspinatus.  PERTINENT CONSULTS:  Dr. Venita Lick.  Briefly, this is a very pleasant 45 year old female, presented to emergency room August 15, 2011 with chest pain, starting around 1 a.m.  She described as tightness in the chest, squeezing quality.  She was given GI cocktail which did not help.  Took two nitroglycerin which helped.  She has had prior episodes, but these resolved on their own. She was sitting in  bed and started to have the chest pain.  She ran 10K on Sunday and experienced no other issues subsequent to that other than this new onset chest pain.  VITALS ON ADMISSION:  Blood pressure 143/75, respirations 16, pulse 59, temperature 98.6.  PERTINENT ADMISSION LABS:  Hemoglobin 12.8, white cell count 5.9, platelets 275.  Cardiac enzymes were negative.  EKG showed sinus bradycardia.  HOSPITAL COURSE:  According to issue: 1. Chest pain.  It was thought that the pain was eventually     noncardiogenic.  On exam on day #2 of her admission, she had some     tender left AC joint tenderness and tenderness in the left     bicipital groove and it was not possible to actually do the Neer's     or Hawkins test on her, however, Dr. Gary Fleet PA did not see the     patient and I did speak personally with Dr. Shon Baton from orthopedics     regarding the same.  The patient should follow up with Dr. Penni Bombard     with [facility] in 7-10 days for further issues.  The     patient may benefit from physical therapy, occupational  therapy for     rehab exercises. 2. CT abnormality.  The patient was found to have a CT abnormality     with possible reactive lymph nodes versus malignancy and she has     had a workup in the hospital inclusive of a negative CEA and ACE     level.  She will get the PET scan done and probably proceed from     there to determine what further course would be.  She is aware that     this might in fact the surrogate for cancer and we will follow     along and see how she does. 3. Elevated blood pressure without a diagnosis of hypertension.  The     patient had blood pressures in the high 130s to mid 140s systolic     over 90s.  She may benefit from dietary modification and weight     loss as well as low-salt diet and I have encouraged her to contact     her primary care physician Dr. Peggyann Juba for further workup which     is not urgent at this time. 4. Bradycardia.  This patient was asymptomatic.  She had TSH which     essentially ruled this out as TSH was normal.  The patient was seen on day of discharge, was doing very well.  Her pain in the shoulder had decreased from 10/10 to 7/10.  She is tolerating a full diet, had no nausea, no vomiting, no other issues.  Temperature 97.6, pulse 49, respirations 18, blood pressure 130-143 over 87-89, sats 97% on room air.  I have discussed fully with the patient the plan of care and she understands the need for followup.  It was pleasure taking care of this patient.          ______________________________ Pleas Koch, MD     JS/MEDQ  D:  08/16/2011  T:  08/16/2011  Job:  [id number]  cc:   Sandford Craze, NP Alvy Beal, MD Dr. Penni Bombard  Electronically Signed by Pleas Koch MD on 08/27/2011 03:46:35 PM

## 2011-08-28 ENCOUNTER — Ambulatory Visit: Payer: BC Managed Care – PPO | Admitting: Family

## 2011-08-29 NOTE — H&P (Signed)
  NAMEKENNY, STERN NO.:  [id number]  MEDICAL RECORD NO.:  [id number]  LOCATION:  4741                         FACILITY:  [facility]  PHYSICIAN:  Alvy Beal, MD    DATE OF BIRTH:  08-13-1966  DATE OF ADMISSION:  08/14/2011 DATE OF DISCHARGE:                             HISTORY & PHYSICAL   PRIMARY CARE PHYSICIAN:  Bernadette Hoit, MD  CHIEF COMPLAINT:  Left shoulder pain.  HISTORY OF PRESENT ILLNESS:  This is a 45 year old African American female otherwise healthy who presented to the emergency department with chest pain on August 14, 2011.  She reports running a 10K this past Sunday.  After appropriate workup, a GI cocktail was administered which failed to alleviate her pain.  Subsequently, she was given nitro type 2 which relieved the pain.  During the hospital stay, she complained of left shoulder pain.  Dr. Shon Baton, orthopedic surgeon on call was consulted to evaluate left shoulder pain.  MRI and x-rays of the left shoulder were ordered.  The patient also states that she has anterior left-sided neck and throat pain.  She denies difficulty swallowing.  ALLERGIES:  No known drug allergies.  PAST MEDICAL HISTORY:  Unremarkable.  SURGICAL HISTORY:  Status post hysterectomy, status post lap band.  SOCIAL HISTORY:  Denies alcohol, denies tobacco, denies illicit drug use.  REVIEW OF SYSTEMS:  No chest pain, no shortness of breath, no cough, no fever, no chills.  MUSCULOSKELETAL:  Tenderness with palpation over the left AC joint.  Positive pain with passive range of motion of the left shoulder.  Tenderness to palpation over the sternoclavicular joint on the left.  No pain with cervical range of motion.  Neurovascular status intact in bilateral upper extremities.  Upper extremity pulses are equal and symmetric.  IMAGING:  MRI of the left shoulder demonstrates: 1. Supraspinatus and infraspinatus tendinopathy.  Changes appear worse     in the anterior  aspect of the supraspinatus with no focal tears or     pain. 2. Glenohumeral degenerative disease.  Paralabral cyst along the     posterior margin of the glenoid is indicative of a labral tear     although no tear is seen on this study. 3. Acromioclavicular osteoarthritis creates some mass effect on the     supraspinatus.  X-RAYS:  X-rays taken today at the left shoulder by report are negative.  ASSESSMENT AND PLAN: 1. Left shoulder pain.  Follow up with Dr. Melrose Nakayama at [facility] for outpatient management of the     left shoulder in 7-10 days after discharge. 2. Left-sided neck pain, recommend ENT evaluation.  Also recommend     neck x-rays.  We will be happy to reevaluate the patient once these     are done.    ______________________________ Norval Gable, PA   ______________________________ Alvy Beal, MD    DK/MEDQ  D:  08/15/2011  T:  08/16/2011  Job:  [id number]  Electronically Signed by Norval Gable PA on 08/22/2011 07:31:19 AM Electronically Signed by Venita Lick MD on 08/29/2011 08:18:13 AM

## 2011-09-13 ENCOUNTER — Telehealth: Payer: Self-pay | Admitting: *Deleted

## 2011-09-13 NOTE — Telephone Encounter (Signed)
Records received from Dr Riley Nearing and forwarded to Provider for review.

## 2011-09-18 ENCOUNTER — Encounter: Payer: Self-pay | Admitting: Family

## 2011-10-08 ENCOUNTER — Other Ambulatory Visit: Payer: Self-pay | Admitting: Internal Medicine

## 2011-11-26 ENCOUNTER — Other Ambulatory Visit (HOSPITAL_BASED_OUTPATIENT_CLINIC_OR_DEPARTMENT_OTHER): Payer: BC Managed Care – PPO

## 2011-11-26 ENCOUNTER — Telehealth: Payer: Self-pay | Admitting: Family

## 2011-11-26 DIAGNOSIS — R911 Solitary pulmonary nodule: Secondary | ICD-10-CM

## 2011-11-26 NOTE — Telephone Encounter (Signed)
Spoke with pt. She is agreeable to proceed with follow up CT.  Myriam Jacobson, could you please arrange CT? thanks

## 2011-12-21 ENCOUNTER — Telehealth: Payer: Self-pay | Admitting: Family

## 2011-12-21 NOTE — Telephone Encounter (Signed)
Patient is scheduled for CT  Chest   Jan  28th    Seeing you for result  Tuesday  Jan  29  @ 1:15pm

## 2011-12-24 ENCOUNTER — Ambulatory Visit (HOSPITAL_BASED_OUTPATIENT_CLINIC_OR_DEPARTMENT_OTHER)
Admission: RE | Admit: 2011-12-24 | Discharge: 2011-12-24 | Disposition: A | Discharge status: Home / Self Care | Payer: BC Managed Care – PPO | Source: Ambulatory Visit | Attending: Family | Admitting: Family

## 2011-12-24 DIAGNOSIS — R911 Solitary pulmonary nodule: Secondary | ICD-10-CM

## 2011-12-24 DIAGNOSIS — I517 Cardiomegaly: Secondary | ICD-10-CM

## 2011-12-24 DIAGNOSIS — J984 Other disorders of lung: Secondary | ICD-10-CM | POA: Insufficient documentation

## 2011-12-25 ENCOUNTER — Ambulatory Visit (INDEPENDENT_AMBULATORY_CARE_PROVIDER_SITE_OTHER): Payer: BC Managed Care – PPO | Admitting: Family

## 2011-12-25 ENCOUNTER — Encounter: Payer: Self-pay | Admitting: Family

## 2011-12-25 VITALS — BP 140/88 | HR 52 | Temp 98.2°F | Resp 16 | Ht 69.0 in | Wt 268.0 lb

## 2011-12-25 DIAGNOSIS — J309 Allergic rhinitis, unspecified: Secondary | ICD-10-CM | POA: Insufficient documentation

## 2011-12-25 DIAGNOSIS — B9689 Other specified bacterial agents as the cause of diseases classified elsewhere: Secondary | ICD-10-CM

## 2011-12-25 DIAGNOSIS — J984 Other disorders of lung: Secondary | ICD-10-CM

## 2011-12-25 DIAGNOSIS — N760 Acute vaginitis: Secondary | ICD-10-CM

## 2011-12-25 DIAGNOSIS — R911 Solitary pulmonary nodule: Secondary | ICD-10-CM

## 2011-12-25 DIAGNOSIS — A499 Bacterial infection, unspecified: Secondary | ICD-10-CM

## 2011-12-25 MED ORDER — CETIRIZINE HCL 10 MG PO TABS: 10.0000 mg | ORAL_TABLET | Freq: Every day | ORAL | Status: DC

## 2011-12-25 NOTE — Assessment & Plan Note (Signed)
She is on Tindamax per GYN.

## 2011-12-25 NOTE — Assessment & Plan Note (Addendum)
Stable. Pt is requesting a prescription for her zyrtec so that she can use her benefit card. [facility]

## 2011-12-25 NOTE — Progress Notes (Signed)
Subjective:    Patient ID: Debra Carpenter, female    DOB: [birth year], 46 y.o.   MRN: [id number]  HPI  Ms.  Adrian Blackwater is a 46 yr old female who presents today for follow up of her CT.  1) Lung nodule- initially seen on PET scan in September 2012.  She had a follow up CT which showed mild enlargement of this ground glass opacity.   2) Sinus congestion-  She has been using zyrtec, but notes that if she misses a dose she breaks out in hives,   3) Bacterial Vaginosis- Reports that she saw Dr. Billy Coast and started a prescription for tindamax.      Review of Systems See HPI  Past Medical History  Diagnosis Date  . GERD (gastroesophageal reflux disease)   . Hypertension   . Anxiety and depression   . Lung nodule 08/24/2011  . Bony sclerosis 08/22/2011    History   Social History  . Marital Status: Married    Spouse Name: N/A    Number of Children: 3  . Years of Education: N/A   Occupational History  .  [employer]   Social History Main Topics  . Smoking status: Never Smoker   . Smokeless tobacco: Not on file  . Alcohol Use: No  . Drug Use: No  . Sexually Active: Not Currently   Other Topics Concern  . 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  3 children- 72 daughter, 92 son, 35 daughter    Past Surgical History  Procedure Date  . Laparoscopic gastric banding   . Abdominal hysterectomy 2007    Family History  Problem Relation Age of Onset  . Hypertension Mother   . Hypertension Father   . Cancer Father     ? colon cancer  . Cancer Maternal Grandmother     ovarian cancer  . Hypertension Maternal Grandfather   . Hypertension Paternal Grandfather     No Known Allergies  Current Outpatient Prescriptions on File Prior to Visit  Medication Sig Dispense Refill  . Calcium Carbonate-Vitamin D (CALTRATE 600+D) 600-400 MG-UNIT per tablet Take 1  tablet by mouth 2 (two) times daily.      Marland Kitchen FLUoxetine (PROZAC) 20 MG capsule Take 30 mg by mouth Daily.        BP 140/88  Pulse 52  Temp(Src) 98.2 F (36.8 C) (Oral)  Resp 16  Ht 5\' 9"  (1.753 m)  Wt 268 lb 0.6 oz (121.582 kg)  BMI 39.58 kg/m2  SpO2 99%       Objective:   Physical Exam  Constitutional: She is oriented to person, place, and time. She appears well-developed and well-nourished. No distress.  HENT:  Head: Normocephalic and atraumatic.  Right Ear: Tympanic membrane and ear canal normal.  Left Ear: Tympanic membrane normal. Decreased hearing is noted.  Mouth/Throat: No posterior oropharyngeal edema or posterior oropharyngeal erythema.  Cardiovascular: Normal rate and regular rhythm.   No murmur heard. Pulmonary/Chest: Effort normal and breath sounds normal. No respiratory distress. She has no wheezes. She has no rales. She exhibits no tenderness.  Musculoskeletal: She exhibits no edema.  Neurological: She is alert and oriented to person, place, and time. No cranial nerve deficit.  Skin: Skin is warm and dry. No rash noted. No erythema. No pallor.  Psychiatric: She has a normal mood and affect. Her behavior is normal. Judgment  and thought content normal.          Assessment & Plan:

## 2011-12-25 NOTE — Assessment & Plan Note (Signed)
Slightly enlarged on CT. Will refer to pulmonary for further evaluation.

## 2011-12-25 NOTE — Patient Instructions (Signed)
Please follow up in 6 months, sooner if needed.  

## 2012-01-03 ENCOUNTER — Ambulatory Visit (INDEPENDENT_AMBULATORY_CARE_PROVIDER_SITE_OTHER): Payer: BC Managed Care – PPO | Admitting: Critical Care Medicine

## 2012-01-03 ENCOUNTER — Encounter: Payer: Self-pay | Admitting: Critical Care Medicine

## 2012-01-03 VITALS — BP 130/76 | HR 65 | Temp 98.2°F | Ht 68.0 in | Wt 269.0 lb

## 2012-01-03 DIAGNOSIS — R911 Solitary pulmonary nodule: Secondary | ICD-10-CM

## 2012-01-03 NOTE — Patient Instructions (Signed)
Repeat CT Chest in 6months, I will call with results No other changes for now

## 2012-01-03 NOTE — Assessment & Plan Note (Signed)
Small .7mm ground glass nodule in RLL without attendant lymphadenopathy.  Doubt is malignant. Neg PET scan. No active pulmonary complaints Plan Repeat CT Chest in 6 months. No need for any biopsy at this time No change in medications at this time

## 2012-01-03 NOTE — Progress Notes (Signed)
Subjective:    Patient ID: Debra Carpenter, female    DOB: 1966/05/25, 46 y.o.   MRN: [id number]  HPI  46 y.o.AAF  RLL ground glass nodule  This pt had a sudden onset of chest pain ,  Came to ED CXR ? Abn>>CT scan showed the nodule in RLL. F/u scan 12/12: sl larger.  Never smoker.  Pain went away. Admitted to hosp and w/u for heart was normal.  No cough.  No dyspnea.  No wheezing.  No edema in feet. No exposure hx.  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    No TB exposure.   Past Medical History  Diagnosis Date  . GERD (gastroesophageal reflux disease)   . Hypertension   . Anxiety and depression   . Lung nodule 08/24/2011  . Bony sclerosis 08/22/2011     Family History  Problem Relation Age of Onset  . Hypertension Mother   . Hypertension Father   . Cancer Father     ? colon cancer  . Cancer Maternal Grandmother     ovarian cancer  . Hypertension Maternal Grandfather   . Hypertension Paternal Grandfather      History   Social History  . Marital Status: Married    Spouse Name: N/A    Number of Children: 3  . Years of Education: N/A   Occupational History  .  [employer]   Social History Main Topics  . Smoking status: Never Smoker   . Smokeless tobacco: Never Used  . Alcohol Use: No  . Drug Use: No  . Sexually Active: Not Currently   Other Topics Concern  . 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  3 children- 19 daughter, 12 son, 49 daughter     No Known Allergies   Outpatient Prescriptions Prior to Visit  Medication Sig Dispense Refill  . Calcium Carbonate-Vitamin D (CALTRATE 600+D) 600-400 MG-UNIT per tablet Take 1 tablet by mouth 2 (two) times daily.      . cetirizine (ZYRTEC) 10 MG tablet Take 1 tablet (10 mg total) by mouth daily.  30 tablet  11  . FLUoxetine (PROZAC) 20 MG capsule Take 30 mg by mouth Daily.      Marland Kitchen tinidazole (TINDAMAX) 500 MG tablet  Take 1,000 mg by mouth daily with breakfast.          Review of Systems  Constitutional: Negative for fever, chills, diaphoresis, activity change, appetite change, fatigue and unexpected weight change.  HENT: Positive for sinus pressure. Negative for hearing loss, ear pain, nosebleeds, congestion, sore throat, facial swelling, rhinorrhea, sneezing, mouth sores, trouble swallowing, neck pain, neck stiffness, dental problem, voice change, postnasal drip, tinnitus and ear discharge.   Eyes: Negative for photophobia, discharge, itching and visual disturbance.  Respiratory: Negative for apnea, cough, choking, chest tightness, shortness of breath, wheezing and stridor.   Cardiovascular: Negative for chest pain, palpitations and leg swelling.  Gastrointestinal: Negative for nausea, vomiting, abdominal pain, constipation, blood in stool and abdominal distention.  Genitourinary: Negative for dysuria, urgency, frequency, hematuria, flank pain, decreased urine volume and difficulty urinating.  Musculoskeletal: Negative for myalgias, back pain, joint swelling, arthralgias and gait problem.  Skin: Negative for color change, pallor and rash.  Neurological: Positive for headaches. Negative for dizziness, tremors, seizures, syncope, speech difficulty, weakness, light-headedness and numbness.  Hematological: Negative for adenopathy. Bruises/bleeds easily.  Psychiatric/Behavioral: Negative for confusion, sleep disturbance  and agitation. The patient is not nervous/anxious.        Objective:   Physical Exam Filed Vitals:   01/03/12 1540  BP: 130/76  Pulse: 65  Temp: 98.2 F (36.8 C)  TempSrc: Oral  Height: 5\' 8"  (1.727 m)  Weight: 269 lb (122.018 kg)  SpO2: 99%    Gen: Pleasant, well-nourished, in no distress,  normal affect  ENT: No lesions,  mouth clear,  oropharynx clear, no postnasal drip  Neck: No JVD, no TMG, no carotid bruits  Lungs: No use of accessory muscles, no dullness to percussion,  clear without rales or rhonchi  Cardiovascular: RRR, heart sounds normal, no murmur or gallops, no peripheral edema  Abdomen: soft and NT, no HSM,  BS normal  Musculoskeletal: No deformities, no cyanosis or clubbing  Neuro: alert, non focal  Skin: Warm, no lesions or rashes     CT Chest 9/12: 7mm ground glass nodule RLL  PET Scan 9/12 1. Examination is negative for hypermetabolic adenopathy. 2. 7 mm ground-glass nodule in the right lower lobe. follow-up CT at 3-6 months.  CT Chest 12/24/11: No real change in RLL ground glass nodule No LAN     Assessment & Plan:   Lung nodule Small .7mm ground glass nodule in RLL without attendant lymphadenopathy.  Doubt is malignant. Neg PET scan. No active pulmonary complaints Plan Repeat CT Chest in 6 months. No need for any biopsy at this time No change in medications at this time    Updated Medication List Outpatient Encounter Prescriptions as of 01/03/2012  Medication Sig Dispense Refill  . Calcium Carbonate-Vitamin D (CALTRATE 600+D) 600-400 MG-UNIT per tablet Take 1 tablet by mouth 2 (two) times daily.      . cetirizine (ZYRTEC) 10 MG tablet Take 1 tablet (10 mg total) by mouth daily.  30 tablet  11  . FLUoxetine (PROZAC) 20 MG capsule Take 30 mg by mouth Daily.      Marland Kitchen DISCONTD: tinidazole (TINDAMAX) 500 MG tablet Take 1,000 mg by mouth daily with breakfast.

## 2012-01-09 ENCOUNTER — Institutional Professional Consult (permissible substitution): Payer: BC Managed Care – PPO | Admitting: Critical Care Medicine

## 2012-02-27 ENCOUNTER — Telehealth: Payer: Self-pay | Admitting: *Deleted

## 2012-02-27 NOTE — Telephone Encounter (Signed)
Patient has scheduled follow up with Dr Rodena Medin for 02/28/2012 @ 8:15 am. Patient is wanting to know if there is something that she could take over the counter for acid reflux. She state she has felt like she has had a knot in her throat since  yesterday evening. She stated  last night she has had shortness of breath and gagging sensation, burning sensation in her left arm, and today she states around 11 am she had cramping in her back. She states that she has never experienced these symptoms before. She states she can eat and drink with no problem.   She was informed recommendation of over the counter could not be advised due to her current symptoms.Patient was advised due to her back, and arm pain with shortness of breath she should be evaluated in the emergency room. Patient has verbalized understanding.

## 2012-02-28 ENCOUNTER — Ambulatory Visit: Payer: BC Managed Care – PPO | Admitting: Internal Medicine

## 2012-02-28 DIAGNOSIS — Z0289 Encounter for other administrative examinations: Secondary | ICD-10-CM

## 2012-03-10 ENCOUNTER — Telehealth: Payer: Self-pay | Admitting: Family

## 2012-03-10 MED ORDER — CETIRIZINE HCL 10 MG PO TABS: 10.0000 mg | ORAL_TABLET | Freq: Every day | ORAL | Status: DC

## 2012-03-10 MED ORDER — FLUOXETINE HCL 20 MG PO TABS: 30.0000 mg | ORAL_TABLET | Freq: Every day | ORAL | Status: DC

## 2012-03-10 NOTE — Telephone Encounter (Signed)
Patient states that she was seen about a month ago and was told that two [facility] were [facility] [address] The pharmacy told her that they never received those refills.  Zyrtec Fluoxetine

## 2012-03-10 NOTE — Telephone Encounter (Signed)
Spoke with pt, she states she never filled the written rx of zyrtec and thought we were going to call the refills in at her last appt in January. Advised pt I will send them in today. Pt was recommended f/u in July and has not scheduled appt yet, she states she will call us back to schedule appt.

## 2012-05-23 ENCOUNTER — Other Ambulatory Visit (HOSPITAL_BASED_OUTPATIENT_CLINIC_OR_DEPARTMENT_OTHER): Payer: BC Managed Care – PPO

## 2012-06-23 ENCOUNTER — Ambulatory Visit (HOSPITAL_BASED_OUTPATIENT_CLINIC_OR_DEPARTMENT_OTHER)
Admission: RE | Admit: 2012-06-23 | Discharge: 2012-06-23 | Disposition: A | Discharge status: Home / Self Care | Payer: BC Managed Care – PPO | Source: Ambulatory Visit | Attending: Critical Care Medicine | Admitting: Critical Care Medicine

## 2012-06-23 ENCOUNTER — Encounter: Payer: Self-pay | Admitting: Critical Care Medicine

## 2012-06-23 DIAGNOSIS — R911 Solitary pulmonary nodule: Secondary | ICD-10-CM

## 2012-06-23 DIAGNOSIS — Z09 Encounter for follow-up examination after completed treatment for conditions other than malignant neoplasm: Secondary | ICD-10-CM | POA: Insufficient documentation

## 2012-06-23 DIAGNOSIS — J984 Other disorders of lung: Secondary | ICD-10-CM | POA: Insufficient documentation

## 2012-06-23 DIAGNOSIS — M899 Disorder of bone, unspecified: Secondary | ICD-10-CM | POA: Insufficient documentation

## 2012-06-24 NOTE — Progress Notes (Signed)
Quick Note:  Called, spoke with pt. She did receive Dr. Lynelle Doctor msg. Advised CT shows no change in nodule. She will need one more CT Scan of chest without contrast in one year per Dr. Delford Field. She verbalized understanding of these results and recs and voiced no further questions/concerns at this time.   Note: Reminder is in Dr. Lynelle Doctor reminder list to f/u on in 1 yr. ______

## 2013-01-14 ENCOUNTER — Encounter (INDEPENDENT_AMBULATORY_CARE_PROVIDER_SITE_OTHER): Payer: Self-pay | Admitting: General Surgery

## 2013-01-14 ENCOUNTER — Ambulatory Visit (INDEPENDENT_AMBULATORY_CARE_PROVIDER_SITE_OTHER): Payer: BC Managed Care – PPO | Admitting: General Surgery

## 2013-01-14 DIAGNOSIS — I10 Essential (primary) hypertension: Secondary | ICD-10-CM

## 2013-01-14 NOTE — Progress Notes (Signed)
Patient ID: Debra Carpenter, female   DOB: [birth year], 47 y.o.   MRN: [id number]  Chief Complaint  Patient presents with  . Weight Loss Surgery    HPI Debra Carpenter is a 47 y.o. female. This patientpresents for evaluation for possible revisional weight loss surgery. She had the placement of a realize band in 2011 in [place] by Dr. Adolphus Carpenter. She says that she initially lost about 25 pounds but has since regained the weight and has been on additional pounds. She says that she feels no restrictions and to need a full meal without any limitation. She denies any heartburn or cough or vomiting. Her last adjustment was in September of this last year. She says that she doesn't require fluoroscopy for her band adjustment. She says that she runs 3 times per week about 3-4 miles per day of and is actually scheduled to run a half marathon in a few weeks. However despite her working out, she does not seem to be able to lose weight. She says that she has never been too tight and has never had an upper GI following her surgery. She is interested in conversion to a sleeve gastrectomy. HPI  Past Medical History  Diagnosis Date  . GERD (gastroesophageal reflux disease)   . Hypertension   . Anxiety and depression   . Lung nodule 08/24/2011  . Bony sclerosis 08/22/2011    Past Surgical History  Procedure Laterality Date  . Laparoscopic gastric banding    . Abdominal hysterectomy  2007    Family History  Problem Relation Age of Onset  . Hypertension Mother   . Hypertension Father   . Cancer Father     ? colon cancer  . Cancer Maternal Grandmother     ovarian cancer  . Hypertension Maternal Grandfather   . Hypertension Paternal Grandfather     Social History History  Substance Use Topics  . Smoking status: Never Smoker   . Smokeless tobacco: Never Used  . Alcohol Use: No    No Known Allergies  Current Outpatient Prescriptions  Medication Sig Dispense Refill  . cetirizine (ZYRTEC) 10 MG  tablet Take 1 tablet (10 mg total) by mouth daily.  30 tablet  3  . FLUoxetine (PROZAC) 20 MG tablet Take 1.5 tablets (30 mg total) by mouth daily.  45 tablet  3  . losartan (COZAAR) 50 MG tablet       . Calcium Carbonate-Vitamin D (CALTRATE 600+D) 600-400 MG-UNIT per tablet Take 1 tablet by mouth 2 (two) times daily.       No current facility-administered medications for this visit.    Review of Systems Review of Systems All other review of systems negative or noncontributory except as stated in the HPI  Pulse 68, temperature 98.9 F (37.2 C), temperature source Temporal, resp. rate 16, height 5\' 9"  (1.753 m), weight 288 lb 12.8 oz (130.999 kg).  Physical Exam Physical Exam Physical Exam  Nursing note and vitals reviewed. Constitutional: She is oriented to person, place, and time. She appears well-developed and well-nourished. No distress.  HENT:  Head: Normocephalic and atraumatic.  Mouth/Throat: No oropharyngeal exudate.  Eyes: Conjunctivae and EOM are normal. Pupils are equal, round, and reactive to light. Right eye exhibits no discharge. Left eye exhibits no discharge. No scleral icterus.  Neck: Normal range of motion. Neck supple. No tracheal deviation present.  Cardiovascular: Normal rate, regular rhythm, normal heart sounds and intact distal pulses.   Pulmonary/Chest: Effort normal and breath sounds normal. No  stridor. No respiratory distress. She has no wheezes.  Abdominal: Soft. Bowel sounds are normal. She exhibits no distension and no mass. There is mild tenderness around her port in the RUQ. There is no rebound and no guarding. Incisions otherwise well healed. Musculoskeletal: Normal range of motion. She exhibits no edema and no tenderness.  Neurological: She is alert and oriented to person, place, and time.  Skin: Skin is warm and dry. No rash noted. She is not diaphoretic. No erythema. No pallor.  Psychiatric: She has a normal mood and affect. Her behavior is normal.  Judgment and thought content normal.    Data Reviewed  outside records   Assessment    Morbid obesity status post lap band placement We had a long discussion about all of these alternatives for continued maintenance of her band versus conversion to sleeve gastrectomy or Roux-en-Y gastric bypass. I explained that this is certainly a much riskier surgery of in the revisional phase.  I am not certain that she has really fully maximize her band and so I have recommended that we get a baseline upper GI to evaluate for any possible band complications and then try to manage her band appropriately.  If we have fully maximize her band, and she still cannot lose the weight, then I would consider conversion to an alternative procedure.    Plan    We will get an upper GI for baseline studies and I will see her back after this to start her band adjustments. She is tricky because of her port is not early palpable except in the standing position, and we may need to perform fluoroscopic adjustments.       Lodema Pilot Debra Carpenter 01/14/2013, 12:35 PM

## 2013-01-23 ENCOUNTER — Ambulatory Visit (HOSPITAL_COMMUNITY)
Admission: RE | Admit: 2013-01-23 | Discharge: 2013-01-23 | Disposition: A | Discharge status: Home / Self Care | Payer: BC Managed Care – PPO | Source: Ambulatory Visit | Attending: General Surgery | Admitting: General Surgery

## 2013-01-23 DIAGNOSIS — K219 Gastro-esophageal reflux disease without esophagitis: Secondary | ICD-10-CM | POA: Insufficient documentation

## 2013-01-23 DIAGNOSIS — Z9884 Bariatric surgery status: Secondary | ICD-10-CM | POA: Insufficient documentation

## 2013-01-23 DIAGNOSIS — I10 Essential (primary) hypertension: Secondary | ICD-10-CM

## 2013-01-23 DIAGNOSIS — F411 Generalized anxiety disorder: Secondary | ICD-10-CM | POA: Insufficient documentation

## 2013-01-28 ENCOUNTER — Ambulatory Visit (INDEPENDENT_AMBULATORY_CARE_PROVIDER_SITE_OTHER): Payer: BC Managed Care – PPO | Admitting: General Surgery

## 2013-01-28 ENCOUNTER — Encounter (INDEPENDENT_AMBULATORY_CARE_PROVIDER_SITE_OTHER): Payer: Self-pay | Admitting: General Surgery

## 2013-01-28 NOTE — Progress Notes (Signed)
Subjective:     Patient ID: Debra Carpenter, female   DOB: 12-Oct-1966, 47 y.o.   MRN: [id number]  HPI This patient follows up today status post upper GI to evaluate her lap band positioning in the esophagus. She had been placed in 2011 and [facility] but is really never lost any weight since her procedure. She came to me initially desiring conversion to sleeve gastrectomy but we decided to check an upper GI to evaluate the band prior to manipulation. Her upper GI shows a dilated esophagus with a small amount reflux but no obvious distal traction and the bed does not appear to be too tight. There doesn't appear to be any esophageal dysmotility. She has some occasional reflux and some infrequent vomiting. She has been running in preparation for her half marathon but still has not been able to lose any weight.  Review of Systems     Objective:   Physical Exam     Assessment:     Obesity status post lap band placement Esophageal dilation This is somewhat difficult problem. She appears to have some esophageal dilation but her band does not appear to be too tight on her upper GI or clinically. I plan to continue to manipulate and fill her bed but I'm concerned that if I continued to tighten the band that her esophagus we'll continue to dilate and start to cause some dysmotility. We again discussed the possibility of conversion to a sleeve gastrectomy which the patient initially desired. We will discuss this with some of my partners end of, but the planned as to whether we will continue to adjust her band or go ahead and schedule her for conversion to sleeve gastrectomy     Plan:     She will call back later this week to discuss any final disposition

## 2013-02-09 ENCOUNTER — Telehealth (INDEPENDENT_AMBULATORY_CARE_PROVIDER_SITE_OTHER): Payer: Self-pay | Admitting: General Surgery

## 2013-02-09 NOTE — Telephone Encounter (Signed)
I called the patient to discuss further recommendations for the management of her band.  I left a message to call me back.

## 2013-02-10 ENCOUNTER — Telehealth (INDEPENDENT_AMBULATORY_CARE_PROVIDER_SITE_OTHER): Payer: Self-pay | Admitting: General Surgery

## 2013-02-10 NOTE — Telephone Encounter (Signed)
I called the patient and recommended that we let out the fluid from her band. We will then reimage her with UGI about 4 weeks to see if improvement in esophageal dilation.  Recommendations will be based on that whether to continue adjustments, obtain manometry, or remove band and convert to sleeve as she desires. We will set her up for lap band adjustment under fluoro.

## 2013-02-11 ENCOUNTER — Other Ambulatory Visit (INDEPENDENT_AMBULATORY_CARE_PROVIDER_SITE_OTHER): Payer: Self-pay

## 2013-02-11 ENCOUNTER — Telehealth (INDEPENDENT_AMBULATORY_CARE_PROVIDER_SITE_OTHER): Payer: Self-pay

## 2013-02-11 DIAGNOSIS — K2289 Other specified disease of esophagus: Secondary | ICD-10-CM

## 2013-02-11 DIAGNOSIS — Z9884 Bariatric surgery status: Secondary | ICD-10-CM

## 2013-02-11 NOTE — Telephone Encounter (Signed)
Patient notified of appointment on Thursday 02/12/13 @ 7:00 am @ [facility] Fluoro room.

## 2013-02-12 ENCOUNTER — Ambulatory Visit (INDEPENDENT_AMBULATORY_CARE_PROVIDER_SITE_OTHER): Payer: BC Managed Care – PPO | Admitting: General Surgery

## 2013-02-12 ENCOUNTER — Encounter (INDEPENDENT_AMBULATORY_CARE_PROVIDER_SITE_OTHER): Payer: Self-pay

## 2013-02-12 ENCOUNTER — Ambulatory Visit (HOSPITAL_COMMUNITY)
Admission: RE | Admit: 2013-02-12 | Discharge: 2013-02-12 | Disposition: A | Discharge status: Home / Self Care | Payer: BC Managed Care – PPO | Source: Ambulatory Visit | Attending: General Surgery | Admitting: General Surgery

## 2013-02-12 ENCOUNTER — Encounter (INDEPENDENT_AMBULATORY_CARE_PROVIDER_SITE_OTHER): Payer: Self-pay | Admitting: General Surgery

## 2013-02-12 ENCOUNTER — Other Ambulatory Visit (INDEPENDENT_AMBULATORY_CARE_PROVIDER_SITE_OTHER): Payer: Self-pay

## 2013-02-12 DIAGNOSIS — Z9884 Bariatric surgery status: Secondary | ICD-10-CM | POA: Insufficient documentation

## 2013-02-12 DIAGNOSIS — K219 Gastro-esophageal reflux disease without esophagitis: Secondary | ICD-10-CM | POA: Insufficient documentation

## 2013-02-12 DIAGNOSIS — Z4651 Encounter for fitting and adjustment of gastric lap band: Secondary | ICD-10-CM

## 2013-02-12 DIAGNOSIS — K2289 Other specified disease of esophagus: Secondary | ICD-10-CM

## 2013-02-12 DIAGNOSIS — R1110 Vomiting, unspecified: Secondary | ICD-10-CM | POA: Insufficient documentation

## 2013-02-12 NOTE — Progress Notes (Signed)
Subjective:     Patient ID: Debra Carpenter, female   DOB: Apr 01, 1966, 47 y.o.   MRN: [id number]  HPI Here for lap band adjustment given her esophageal dilation  Review of Systems     Objective:   Physical Exam I aspirated 5.23ml of clear fluid under fluorscopic guidance.  The port was deep and difficult to access.    Assessment:     Esophageal dilation Fluid removed from her band today.      Plan:     Will deflate band and repeat UGI in 1 month.  If esophagus is unchanged then we will plan for manometry.  If the esophagus normalizes, then we will either plan for conversion to sleeve as she desires or consider moving the port to more shallow position and start adjusting from scratch.  She will follow up in 1 month after her UGI.

## 2013-02-13 ENCOUNTER — Telehealth (INDEPENDENT_AMBULATORY_CARE_PROVIDER_SITE_OTHER): Payer: Self-pay | Admitting: General Surgery

## 2013-02-13 NOTE — Telephone Encounter (Signed)
Spoke with patient she is aware of appt on 02/23/13 at 945 NPO midnight

## 2013-02-13 NOTE — Telephone Encounter (Signed)
Left a message for a patient to call office for appt date at [address]  02/23/13 @945  NPO Midnight

## 2013-02-16 ENCOUNTER — Telehealth: Payer: Self-pay | Admitting: Family

## 2013-02-16 DIAGNOSIS — R911 Solitary pulmonary nodule: Secondary | ICD-10-CM

## 2013-02-16 NOTE — Telephone Encounter (Signed)
Pls call pt and let her know that my records show that she is due for follow up CT chest to monitor lung nodule.  Pended below.

## 2013-02-17 NOTE — Telephone Encounter (Signed)
Notified pt and she states that she has been having a lot of upper GI testing lately and wants to hold off for a while. Pt declines CT at this time.

## 2013-02-17 NOTE — Telephone Encounter (Signed)
Noted  

## 2013-02-23 ENCOUNTER — Ambulatory Visit (INDEPENDENT_AMBULATORY_CARE_PROVIDER_SITE_OTHER): Payer: BC Managed Care – PPO | Admitting: Podiatry

## 2013-02-23 ENCOUNTER — Encounter: Payer: Self-pay | Admitting: Podiatry

## 2013-02-23 ENCOUNTER — Ambulatory Visit
Admission: RE | Admit: 2013-02-23 | Discharge: 2013-02-23 | Disposition: A | Discharge status: Home / Self Care | Payer: BC Managed Care – PPO | Source: Ambulatory Visit | Attending: General Surgery | Admitting: General Surgery

## 2013-02-23 ENCOUNTER — Telehealth (INDEPENDENT_AMBULATORY_CARE_PROVIDER_SITE_OTHER): Payer: Self-pay | Admitting: General Surgery

## 2013-02-23 VITALS — BP 152/89 | HR 66 | Ht 69.0 in | Wt 293.0 lb

## 2013-02-23 DIAGNOSIS — M216X9 Other acquired deformities of unspecified foot: Secondary | ICD-10-CM

## 2013-02-23 DIAGNOSIS — M79672 Pain in left foot: Secondary | ICD-10-CM

## 2013-02-23 DIAGNOSIS — K2289 Other specified disease of esophagus: Secondary | ICD-10-CM

## 2013-02-23 DIAGNOSIS — Z9884 Bariatric surgery status: Secondary | ICD-10-CM

## 2013-02-23 DIAGNOSIS — K228 Other specified diseases of esophagus: Secondary | ICD-10-CM

## 2013-02-23 DIAGNOSIS — M722 Plantar fascial fibromatosis: Secondary | ICD-10-CM

## 2013-02-23 DIAGNOSIS — M79609 Pain in unspecified limb: Secondary | ICD-10-CM

## 2013-02-23 MED ORDER — DEXAMETHASONE SODIUM PHOS SUBCONJUNCTIVAL INJECTION 4 MG/ML: 4.0000 mg | SUBCONJUNCTIVAL | Status: DC

## 2013-02-23 MED ORDER — TRIAMCINOLONE ACETONIDE INTRAVITREAL INJECTION 4 MG/0.1 ML: 4.0000 mg | INTRAOCULAR | Status: DC

## 2013-02-23 NOTE — Telephone Encounter (Signed)
Pt called to ask if Dr. Biagio Quint had seen the UGI results and could we ask him to call her to discuss.  Pt has appt on 03/19/13 with him, but she will be out of town that week.  Did not yet cancel that appt, pending discussion with Dr. Biagio Quint and his clinical assistant.  She can be reached on her cell phone:  [phone].

## 2013-02-23 NOTE — Patient Instructions (Addendum)
Seen for pain on left heel. Also need to have Orthotics prepared as per previous discussion. Casted for Orthotics. Left heel injection with Cortisone done. Rx. Physical Therapy to stretch tight Achilles Tendon bilateral. Return when Orthotics are ready.

## 2013-02-23 NOTE — Progress Notes (Signed)
SUBJECTIVE: 47 y.o. year old female presents complaining of pain on left heel. She was here for the same problem a year ago and was treated with Cortisone injection. Stated that injection helped her for over a year. She was also ready to have Orthotics prepared.  Patient continues with her running exercise. Soon she will be running 1/2 Marathon at [facility] in May. Patient points plantar medial and posterior area of left heel being area of the pain. Pain is associated with her running activity. She does desk work.    REVIEW OF SYSTEMS: Pertinent items are noted in HPI.  OBJECTIVE: DERMATOLOGIC EXAMINATION: Skin Integrity: no secondary findings  VASCULAR EXAMINATION OF LOWER LIMBS: Pedal pulses: All pedal pulses are palpable with normal pulsation.  Capillary Filling times within 3 seconds in all digits.  Edema: negative noted in the lower limbs bilateral.  Ischemic changes negative on bilateral. Temperature gradient from tibial crest to dorsum of foot is within normal bilateral.  NEUROLOGIC EXAMINATION OF THE LOWER LIMBS: All epicritic and tactile sensations grossly intact.   MUSCULOSKELETAL EXAMINATION: Inducible pain at plantar medial heel with light pressure left without erythema or visible edema Lt.  Positive for normal DP and PT pulses, no trophic changes or ulcerative lesions and normal sensory exam  BIOMECHANICAL EXAMINATION:  Limited ankle dorsiflexion with knee extended or flexed bilateral. Hypermobile first ray bilateral. Positive Bunion deformity bilateral.   ASSESSMENT: 1. Plantar fasciitis left, recurrent. 2. Ankle Equinus bilateral. 3. Hypermobile first ray bilateral with bunion deformity. 4. Faulty Biomechanics and compensatory hyperpronation of STJ left.   PLAN: 1. Injection left heel with 4mg  Triamcinolone and 4mg  Dexamethasone mixed with 1cc 0.5% Marcaine plain. 2. Casted for Orthotics bilateral. 3. Advised to initiate Physical therapy to stretch Achilles  tendon.

## 2013-02-25 ENCOUNTER — Telehealth (INDEPENDENT_AMBULATORY_CARE_PROVIDER_SITE_OTHER): Payer: Self-pay | Admitting: *Deleted

## 2013-02-25 NOTE — Telephone Encounter (Signed)
Patient called to speak with Biagio Quint MD regarding the results of her UGI and to find out the plan forward.  Patient aware Biagio Quint MD unavailable until next week.

## 2013-03-02 ENCOUNTER — Telehealth (INDEPENDENT_AMBULATORY_CARE_PROVIDER_SITE_OTHER): Payer: Self-pay

## 2013-03-02 NOTE — Telephone Encounter (Signed)
The pt called to request UGI results and to know the next step.

## 2013-03-04 ENCOUNTER — Ambulatory Visit: Payer: BC Managed Care – PPO | Attending: Podiatry | Admitting: Physical Therapy

## 2013-03-04 DIAGNOSIS — M25673 Stiffness of unspecified ankle, not elsewhere classified: Secondary | ICD-10-CM | POA: Insufficient documentation

## 2013-03-04 DIAGNOSIS — M6281 Muscle weakness (generalized): Secondary | ICD-10-CM | POA: Insufficient documentation

## 2013-03-04 DIAGNOSIS — IMO0001 Reserved for inherently not codable concepts without codable children: Secondary | ICD-10-CM | POA: Insufficient documentation

## 2013-03-04 DIAGNOSIS — M25579 Pain in unspecified ankle and joints of unspecified foot: Secondary | ICD-10-CM | POA: Insufficient documentation

## 2013-03-04 DIAGNOSIS — M25676 Stiffness of unspecified foot, not elsewhere classified: Secondary | ICD-10-CM | POA: Insufficient documentation

## 2013-03-06 ENCOUNTER — Ambulatory Visit: Payer: BC Managed Care – PPO | Admitting: Rehabilitation

## 2013-03-09 ENCOUNTER — Ambulatory Visit: Payer: BC Managed Care – PPO | Admitting: Physical Therapy

## 2013-03-09 ENCOUNTER — Telehealth (INDEPENDENT_AMBULATORY_CARE_PROVIDER_SITE_OTHER): Payer: Self-pay

## 2013-03-09 NOTE — Telephone Encounter (Signed)
Patient reports no improvement since fluid has been removed from her lap band.  Patient would like to know what's the next step for her.  Please call Ms. Debra Carpenter @ [phone] to discuss fin further detail.

## 2013-03-10 ENCOUNTER — Telehealth (INDEPENDENT_AMBULATORY_CARE_PROVIDER_SITE_OTHER): Payer: Self-pay

## 2013-03-10 NOTE — Telephone Encounter (Signed)
Called patient with follow up appointment scheduled for Thursday 03/19/13 @ 9:00 am w/Dr. Biagio Quint.

## 2013-03-12 ENCOUNTER — Ambulatory Visit: Payer: BC Managed Care – PPO | Admitting: Rehabilitation

## 2013-03-17 ENCOUNTER — Ambulatory Visit: Payer: BC Managed Care – PPO | Admitting: Rehabilitation

## 2013-03-19 ENCOUNTER — Ambulatory Visit (INDEPENDENT_AMBULATORY_CARE_PROVIDER_SITE_OTHER): Payer: BC Managed Care – PPO | Admitting: General Surgery

## 2013-03-19 ENCOUNTER — Encounter (INDEPENDENT_AMBULATORY_CARE_PROVIDER_SITE_OTHER): Payer: Self-pay | Admitting: General Surgery

## 2013-03-19 DIAGNOSIS — Z6841 Body Mass Index (BMI) 40.0 and over, adult: Secondary | ICD-10-CM

## 2013-03-19 NOTE — Progress Notes (Signed)
Subjective:     Patient ID: Debra Carpenter, female   DOB: 07/15/1966, 47 y.o.   MRN: [id number]  HPI This patient follows up to discuss possible removal of lap bandand conversion to a sleeve gastrectomy. We have removed all the fluid from her band and she says she actually has lost a little bit of weight since then but remains at a BMI of 42. She is running almost daily and is scheduled to run a half marathon this weekend and it sounds as though she is eating right and exercising without any significant weight loss. She is interested in removal of her band and conversion to a sleeve gastrectomy, however, since I have removed the fluid from her band she says that she has developed some reflux where she has some feeling of saliva and choking when lying down at night. Prior to her lap band, she had similar symptoms and was treated with Nexium with good relief. She has not been taking any antireflux medications currently for this and states that her symptoms are fairly infrequent but she has noticed symptoms. She is not interested in gastric bypass due to the thought of rearranging her intestines.  Review of Systems     Objective:   Physical Exam Nad, nontoxic     Assessment:     Obesity  with a long discussion with her regarding the options for #1 trying to make her current LAP-BAND work for her to achieve weight loss by moving her port more superficial and gradually filling her. #2 removal of her LAP-BAND and two-stage procedure with subsequent either sleeve gastrectomy or Roux-en-Y gastric bypass depending on how significant her reflux is. #3  A 1 stage procedure of pin removal and conversion to sleeve or gastric bypass. I explained the pros and cons of each option and explained the increased risk with conversion in either one or 2 stage procedure but I did recommend that if she does want to have a revisional procedures that we do this in 2 stages.  This would also allow [place] to see how significant her  refluxes without the band and placed and that her reflux is significant then I explained that sleeve gastrectomy would not really been a great option for her.  She really is not interested in a gastric bypass so she is going to take some time to think about her options and will let me know what she would like to do.    Plan:     I will await to hear from her with regard to her interest in been removed or continuing with making the band work

## 2013-03-20 ENCOUNTER — Telehealth (INDEPENDENT_AMBULATORY_CARE_PROVIDER_SITE_OTHER): Payer: Self-pay | Admitting: General Surgery

## 2013-03-20 NOTE — Telephone Encounter (Signed)
I called her back to discuss the options again and she would like to go ahead and schedule removal with either 1 or 2 stage conversion to sleeve.  I again expressed my concern for her reflux and worsening reflux and it would be nice to have some time between removal to see if reflux will be a problem for her since the only option for treatment after would be conversion to RYGB.  She is okay with either option and we will check on insurance coverage for this.

## 2013-03-26 ENCOUNTER — Encounter: Payer: BC Managed Care – PPO | Attending: General Surgery | Admitting: *Deleted

## 2013-03-26 ENCOUNTER — Ambulatory Visit: Payer: BC Managed Care – PPO | Attending: Podiatry | Admitting: Physical Therapy

## 2013-03-26 DIAGNOSIS — E669 Obesity, unspecified: Secondary | ICD-10-CM | POA: Insufficient documentation

## 2013-03-26 DIAGNOSIS — M25673 Stiffness of unspecified ankle, not elsewhere classified: Secondary | ICD-10-CM | POA: Insufficient documentation

## 2013-03-26 DIAGNOSIS — M25676 Stiffness of unspecified foot, not elsewhere classified: Secondary | ICD-10-CM | POA: Insufficient documentation

## 2013-03-26 DIAGNOSIS — IMO0001 Reserved for inherently not codable concepts without codable children: Secondary | ICD-10-CM | POA: Insufficient documentation

## 2013-03-26 DIAGNOSIS — Z713 Dietary counseling and surveillance: Secondary | ICD-10-CM | POA: Insufficient documentation

## 2013-03-26 DIAGNOSIS — M25579 Pain in unspecified ankle and joints of unspecified foot: Secondary | ICD-10-CM | POA: Insufficient documentation

## 2013-03-26 DIAGNOSIS — Z01818 Encounter for other preprocedural examination: Secondary | ICD-10-CM | POA: Insufficient documentation

## 2013-03-26 DIAGNOSIS — M6281 Muscle weakness (generalized): Secondary | ICD-10-CM | POA: Insufficient documentation

## 2013-04-01 ENCOUNTER — Other Ambulatory Visit (INDEPENDENT_AMBULATORY_CARE_PROVIDER_SITE_OTHER): Payer: Self-pay | Admitting: General Surgery

## 2013-04-10 ENCOUNTER — Telehealth (INDEPENDENT_AMBULATORY_CARE_PROVIDER_SITE_OTHER): Payer: Self-pay

## 2013-04-10 NOTE — Telephone Encounter (Signed)
Pt called to inquire how long she would be out of work after her upcoming surgery.  Was told anywhere from 3-4 weeks.

## 2013-04-12 NOTE — Patient Instructions (Signed)
Follow:   Pre-Op Diet per MD 2 weeks prior to surgery  Phase 2- Liquids (clear/full) 2 weeks after surgery  Vitamin/Mineral/Calcium guidelines for purchasing bariatric supplements  Exercise guidelines pre and post-op per MD  Follow-up at [facility] in 2 weeks post-op for diet advancement. Contact Noelle Sease as needed with questions/concerns. 

## 2013-04-12 NOTE — Progress Notes (Signed)
Bariatric Class:  Appt start time: 1730 end time:  1830.  Pre-Operative Nutrition Class  Patient was seen on 03/26/12 for Pre-Operative Bariatric Surgery Education at [facility].   Surgery date: 05/05/13 Surgery type: LAGB to Sleeve Surgeon: Biagio Quint Start weight: 287.2 (@ surgeon 03/19/13)   Weight today: 291.0 lbs Weight change: 3.8 lbs Total weight lost: 0  TANITA  BODY COMP RESULTS  03/26/13   BMI (kg/m^2) 43.0   Fat Mass (lbs) 162.0   Fat Free Mass (lbs) 129.0   Total Body Water (lbs) 94.5   Samples given per MNT protocol: Bariatric Advantage Multivitamin Lot # [id number] Exp: 10/15  Bariatric Advantage Calcium Citrate Lot # [id number] Exp: 10/15  Celebrate Vitamins Multivitamin Lot # [id number] Exp: 07/15  Premier Protein Shake Lot # [id number] Exp: 12/08/13  Corliss Marcus Protein Powder Lot # [id number] Exp: 06/15  The following the learning objective met by the patient during this course:  Identifies Pre-Op Dietary Goals and will begin 2 weeks pre-operatively  Identifies appropriate sources of fluids and proteins   States protein recommendations and appropriate sources pre and post-operatively  Identifies Post-Operative Dietary Goals and will follow for 2 weeks post-operatively  Identifies appropriate multivitamin and calcium sources  Describes the need for physical activity post-operatively and will follow MD recommendations  States when to call healthcare provider regarding medication questions or post-operative complications  Handouts given during class include:  Pre-Op Bariatric Surgery Diet Handout  Protein Shake Handout  Post-Op Bariatric Surgery Nutrition Handout  BELT Program Information Flyer  Support Group Information Flyer  [facility] Bariatric Supplements Price List  Follow-Up Plan: Patient will follow-up at [facility] 2 weeks post operatively for diet advancement per MD.

## 2013-04-17 ENCOUNTER — Ambulatory Visit: Payer: BC Managed Care – PPO | Admitting: Podiatry

## 2013-04-21 ENCOUNTER — Encounter (HOSPITAL_COMMUNITY): Payer: Self-pay | Admitting: Pharmacy Technician

## 2013-04-23 ENCOUNTER — Telehealth (INDEPENDENT_AMBULATORY_CARE_PROVIDER_SITE_OTHER): Payer: Self-pay | Admitting: General Surgery

## 2013-04-23 NOTE — Telephone Encounter (Signed)
She called asking if I thought that changing the Realize band to a Lap Band would be beneficial.  I explained that there is no data saying that one is superior to the other and so if she has failed with one, I would not recommend changing out for another unless there was an actual problem with the band itself.  I would recommend conversion to a sleeve or RYGB.

## 2013-04-24 ENCOUNTER — Ambulatory Visit (INDEPENDENT_AMBULATORY_CARE_PROVIDER_SITE_OTHER): Payer: BC Managed Care – PPO | Admitting: General Surgery

## 2013-04-24 ENCOUNTER — Encounter (INDEPENDENT_AMBULATORY_CARE_PROVIDER_SITE_OTHER): Payer: Self-pay | Admitting: General Surgery

## 2013-04-24 DIAGNOSIS — Z6841 Body Mass Index (BMI) 40.0 and over, adult: Secondary | ICD-10-CM

## 2013-04-24 NOTE — Progress Notes (Signed)
Patient ID: Debra Carpenter, female   DOB: 12/16/1965, 46 y.o.   MRN: [id number]  Chief Complaint  Patient presents with  . Bariatric Pre-op    lap band removal and gastric sleeve placement    HPI Debra Carpenter is a 46 y.o. female.  This patient is known to me for prior evaluation for possible sleeve gastrectomy after removal of an adjustable gastric band. She had a realize band placed in [place] and really has not been able to make this work. We have tried to adjust her in the been unsuccessful with this. She has an esophageal dilation as well which has made this difficult to adjust. She remains interested in vertical sleeve gastrectomy and says that since our last visit she has not had any reflux symptoms. HPI  Past Medical History  Diagnosis Date  . GERD (gastroesophageal reflux disease)   . Hypertension   . Anxiety and depression   . Lung nodule 08/24/2011  . Bony sclerosis 08/22/2011    Past Surgical History  Procedure Laterality Date  . Laparoscopic gastric banding    . Abdominal hysterectomy  2007    Family History  Problem Relation Age of Onset  . Hypertension Mother   . Hypertension Father   . Cancer Father     ? colon cancer  . Cancer Maternal Grandmother     ovarian cancer  . Hypertension Maternal Grandfather   . Hypertension Paternal Grandfather     Social History History  Substance Use Topics  . Smoking status: Never Smoker   . Smokeless tobacco: Never Used  . Alcohol Use: No    No Known Allergies  Current Outpatient Prescriptions  Medication Sig Dispense Refill  . buPROPion (WELLBUTRIN XL) 150 MG 24 hr tablet Take 150 mg by mouth every morning.       . cetirizine (ZYRTEC) 10 MG tablet Take 10 mg by mouth daily.      . FLUoxetine (PROZAC) 10 MG capsule Take 10 mg by mouth every morning.      . metoprolol succinate (TOPROL-XL) 25 MG 24 hr tablet Take 25 mg by mouth every morning.        No current facility-administered medications for this visit.     Review of Systems Review of Systems All other review of systems negative or noncontributory except as stated in the HPI  Blood pressure 126/92, pulse 55, temperature 96 F (35.6 C), temperature source Temporal, resp. rate 16, height 5' 9" (1.753 m), weight 288 lb 12.8 oz (130.999 kg), SpO2 98.00%.  Physical Exam Physical Exam Physical Exam  Nursing note and vitals reviewed. Constitutional: She is oriented to person, place, and time. She appears well-developed and well-nourished. No distress.  HENT:  Head: Normocephalic and atraumatic.  Mouth/Throat: No oropharyngeal exudate.  Eyes: Conjunctivae and EOM are normal. Pupils are equal, round, and reactive to light. Right eye exhibits no discharge. Left eye exhibits no discharge. No scleral icterus.  Neck: Normal range of motion. Neck supple. No tracheal deviation present.  Cardiovascular: Normal rate, regular rhythm, normal heart sounds and intact distal pulses.   Pulmonary/Chest: Effort normal and breath sounds normal. No stridor. No respiratory distress. She has no wheezes.  Abdominal: Soft. Bowel sounds are normal. She exhibits no distension and no mass. There is no tenderness. There is no rebound and no guarding.  Musculoskeletal: Normal range of motion. She exhibits no edema and no tenderness.  Neurological: She is alert and oriented to person, place, and time.  Skin: Skin   is warm and dry. No rash noted. She is not diaphoretic. No erythema. No pallor.  Psychiatric: She has a normal mood and affect. Her behavior is normal. Judgment and thought content normal.    Data Reviewed   Assessment    Morbid obesity She remains interested in removal of her adjustable gastric band and conversion to sleeve gastrectomy. We again discussed the possible scenarios of simply removing her band of versus band removal and conversion to sleeve gastrectomy. She understands that this will be intraoperative decision. I again explained that it is higher  risk to perform this in one stage procedure and she expressed understanding of this and still would like to proceed with a one stage procedure if possible despite additional risk. She also understands there risk of possibly creating reflux and worsening reflux necessitating lifelong medication or possible conversion to gastric bypass. We discussed the other risks of the procedure as well and she desires to proceed with removal of her adjustable gastric band with possible conversion to sleeve gastrectomy     Plan     we'll proceed with the surgery as planned on June 10        Mason Dibiasio DAVID 04/24/2013, 9:30 AM    

## 2013-04-28 ENCOUNTER — Other Ambulatory Visit (HOSPITAL_COMMUNITY): Payer: Self-pay | Admitting: General Surgery

## 2013-04-28 NOTE — Patient Instructions (Addendum)
Debra Carpenter  04/28/2013   Your procedure is scheduled on: 05-05-2013  Report to [facility] at 845 AM.  Call this number if you have problems the morning of surgery [phone]   Remember:   Do not eat food:After Midnight.  Sunday NIGHT.   DRINK LIQUIDS ALL DAY Monday AS DIRECTED BY DR Biagio Quint.  NOTHING BY MOUTH AFTER MIDNIGHT Monday NIGHT     Take these medicines the morning of surgery with A SIP OF WATER:  METOPROLOL,  ZYRTEC(allergy med)                                SEE [facility] PREPARING FOR SURGERY SHEET   Do not wear jewelry, make-up or nail polish.  Do not wear lotions, powders, or perfumes. You may wear deodorant.   Men may shave face and neck.  Do not bring valuables to the hospital.  Contacts, dentures or bridgework may not be worn into surgery.  Leave suitcase in the car. After surgery it may be brought to your room.  For patients admitted to the hospital, checkout time is 11:00 AM the day of discharge.   Patients discharged the day of surgery will not be allowed to drive home.  Name and phone number of your driver:daughter  Special Instructions: N/A   Please read over the following fact sheets that you were given: MRSA Information.  Call Theodis Aguas RN pre op nurse if needed [phone]    FAILURE TO FOLLOW THESE INSTRUCTIONS MAY RESULT IN THE CANCELLATION OF YOUR SURGERY. PATIENT SIGNATURE___________________________________________

## 2013-04-28 NOTE — Progress Notes (Signed)
Chest ct 06-24-2012 epic 01-23-2013 ekg epic lov pulmonary dr Delford Field 01-03-2012 epic

## 2013-04-29 ENCOUNTER — Encounter (HOSPITAL_COMMUNITY): Payer: Self-pay

## 2013-04-29 ENCOUNTER — Encounter (HOSPITAL_COMMUNITY)
Admission: RE | Admit: 2013-04-29 | Discharge: 2013-04-29 | Disposition: A | Discharge status: Home / Self Care | Payer: BC Managed Care – PPO | Source: Ambulatory Visit | Attending: General Surgery | Admitting: General Surgery

## 2013-04-29 DIAGNOSIS — Z01812 Encounter for preprocedural laboratory examination: Secondary | ICD-10-CM | POA: Insufficient documentation

## 2013-04-29 HISTORY — DX: Angina pectoris, unspecified: I20.9

## 2013-04-29 HISTORY — DX: Cardiac murmur, unspecified: R01.1

## 2013-04-29 LAB — COMPREHENSIVE METABOLIC PANEL
Albumin: 3.3 g/dL — ABNORMAL LOW (ref 3.5–5.2)
Alkaline Phosphatase: 119 U/L — ABNORMAL HIGH (ref 39–117)
BUN: 11 mg/dL (ref 6–23)
Calcium: 9.2 mg/dL (ref 8.4–10.5)
Creatinine, Ser: 0.53 mg/dL (ref 0.50–1.10)
GFR calc Af Amer: >90 mL/min (ref >90)
Potassium: 3.8 mEq/L (ref 3.5–5.1)
Total Protein: 7.8 g/dL (ref 6.0–8.3)

## 2013-04-29 LAB — CBC WITH DIFFERENTIAL/PLATELET
Basophils Relative: 1 % (ref 0–1)
Eosinophils Absolute: 0.2 10*3/uL (ref 0.0–0.7)
Eosinophils Relative: 3 % (ref 0–5)
Hemoglobin: 13.6 g/dL (ref 12.0–15.0)
MCH: 29.1 pg (ref 26.0–34.0)
MCHC: 34.3 g/dL (ref 30.0–36.0)
MCV: 84.8 fL (ref 78.0–100.0)
Monocytes Relative: 13 % — ABNORMAL HIGH (ref 3–12)
Neutrophils Relative %: 54 % (ref 43–77)

## 2013-04-29 LAB — SURGICAL PCR SCREEN
MRSA, PCR: NEGATIVE
Staphylococcus aureus: NEGATIVE

## 2013-04-29 NOTE — Progress Notes (Signed)
eccho 9/12 epic

## 2013-04-29 NOTE — Progress Notes (Signed)
PST VISIT-    States has run out of METOPROLOL- last dose was 04/22/13.  Instructed to contact PCP and inform them she has been off x 1 week and for refill if they want her to continue.  Informed her she needs to call TODAY- as is extremely important medication- verbalized understanding

## 2013-04-30 ENCOUNTER — Encounter: Payer: BC Managed Care – PPO | Attending: General Surgery

## 2013-04-30 DIAGNOSIS — E669 Obesity, unspecified: Secondary | ICD-10-CM | POA: Insufficient documentation

## 2013-04-30 DIAGNOSIS — Z713 Dietary counseling and surveillance: Secondary | ICD-10-CM | POA: Insufficient documentation

## 2013-04-30 DIAGNOSIS — Z01818 Encounter for other preprocedural examination: Secondary | ICD-10-CM | POA: Insufficient documentation

## 2013-05-05 ENCOUNTER — Encounter (HOSPITAL_COMMUNITY)
Admission: RE | Disposition: A | Discharge status: Home / Self Care | Payer: Self-pay | Source: Ambulatory Visit | Attending: General Surgery

## 2013-05-05 ENCOUNTER — Encounter (HOSPITAL_COMMUNITY): Payer: Self-pay | Admitting: Certified Registered Nurse Anesthetist

## 2013-05-05 ENCOUNTER — Observation Stay (HOSPITAL_COMMUNITY)
Admission: RE | Admit: 2013-05-05 | Discharge: 2013-05-06 | Disposition: A | Discharge status: Home / Self Care | Payer: BC Managed Care – PPO | Source: Ambulatory Visit | Attending: General Surgery | Admitting: General Surgery

## 2013-05-05 ENCOUNTER — Inpatient Hospital Stay (HOSPITAL_COMMUNITY): Payer: BC Managed Care – PPO | Admitting: Certified Registered Nurse Anesthetist

## 2013-05-05 ENCOUNTER — Encounter (HOSPITAL_COMMUNITY): Payer: Self-pay | Admitting: *Deleted

## 2013-05-05 DIAGNOSIS — I10 Essential (primary) hypertension: Secondary | ICD-10-CM | POA: Insufficient documentation

## 2013-05-05 DIAGNOSIS — Z4651 Encounter for fitting and adjustment of gastric lap band: Principal | ICD-10-CM | POA: Insufficient documentation

## 2013-05-05 DIAGNOSIS — K228 Other specified diseases of esophagus: Secondary | ICD-10-CM | POA: Insufficient documentation

## 2013-05-05 DIAGNOSIS — Z79899 Other long term (current) drug therapy: Secondary | ICD-10-CM | POA: Insufficient documentation

## 2013-05-05 DIAGNOSIS — K2289 Other specified disease of esophagus: Secondary | ICD-10-CM | POA: Insufficient documentation

## 2013-05-05 DIAGNOSIS — K9509 Other complications of gastric band procedure: Secondary | ICD-10-CM

## 2013-05-05 DIAGNOSIS — K219 Gastro-esophageal reflux disease without esophagitis: Secondary | ICD-10-CM | POA: Insufficient documentation

## 2013-05-05 HISTORY — PX: LAPAROSCOPIC REPAIR AND REMOVAL OF GASTRIC BAND: SHX5919

## 2013-05-05 SURGERY — LAPAROSCOPIC REPAIR AND REMOVAL OF GASTRIC BAND
Anesthesia: General | Site: Abdomen | Wound class: Clean

## 2013-05-05 MED ORDER — KCL IN DEXTROSE-NACL 20-5-0.45 MEQ/L-%-% IV SOLN
INTRAVENOUS | Status: DC
  Administered 2013-05-05 – 2013-05-06 (×2): via INTRAVENOUS
  Filled 2013-05-05 (×5): qty 1000

## 2013-05-05 MED ORDER — HEPARIN SODIUM (PORCINE) 5000 UNIT/ML IJ SOLN
5000.0000 [IU] | Freq: Once | INTRAMUSCULAR | Status: AC
  Administered 2013-05-05: 5000 [IU] via SUBCUTANEOUS
  Filled 2013-05-05: qty 1

## 2013-05-05 MED ORDER — ONDANSETRON HCL 4 MG PO TABS: 4.0000 mg | ORAL_TABLET | Freq: Four times a day (QID) | ORAL | Status: DC | PRN

## 2013-05-05 MED ORDER — BUPROPION HCL ER (XL) 150 MG PO TB24
150.0000 mg | ORAL_TABLET | Freq: Every morning | ORAL | Status: DC
  Filled 2013-05-05: qty 1

## 2013-05-05 MED ORDER — PROPOFOL 10 MG/ML IV BOLUS
INTRAVENOUS | Status: DC | PRN
  Administered 2013-05-05: 200 mg via INTRAVENOUS

## 2013-05-05 MED ORDER — MIDAZOLAM HCL 5 MG/5ML IJ SOLN
INTRAMUSCULAR | Status: DC | PRN
  Administered 2013-05-05: 2 mg via INTRAVENOUS

## 2013-05-05 MED ORDER — LACTATED RINGERS IV SOLN: INTRAVENOUS | Status: DC

## 2013-05-05 MED ORDER — NEOSTIGMINE METHYLSULFATE 1 MG/ML IJ SOLN
INTRAMUSCULAR | Status: DC | PRN
  Administered 2013-05-05: 4 mg via INTRAVENOUS

## 2013-05-05 MED ORDER — ENOXAPARIN SODIUM 40 MG/0.4ML ~~LOC~~ SOLN
40.0000 mg | SUBCUTANEOUS | Status: DC
  Administered 2013-05-06: 40 mg via SUBCUTANEOUS
  Filled 2013-05-05 (×2): qty 0.4

## 2013-05-05 MED ORDER — ACETAMINOPHEN 10 MG/ML IV SOLN
INTRAVENOUS | Status: DC | PRN
  Administered 2013-05-05: 1000 mg via INTRAVENOUS

## 2013-05-05 MED ORDER — GLYCOPYRROLATE 0.2 MG/ML IJ SOLN
INTRAMUSCULAR | Status: DC | PRN
  Administered 2013-05-05: 0.6 mg via INTRAVENOUS

## 2013-05-05 MED ORDER — HYDROMORPHONE HCL PF 1 MG/ML IJ SOLN
INTRAMUSCULAR | Status: AC
  Filled 2013-05-05: qty 1

## 2013-05-05 MED ORDER — BUPIVACAINE HCL (PF) 0.25 % IJ SOLN
INTRAMUSCULAR | Status: AC
  Filled 2013-05-05: qty 30

## 2013-05-05 MED ORDER — HYDROMORPHONE HCL PF 1 MG/ML IJ SOLN
0.2500 mg | INTRAMUSCULAR | Status: DC | PRN
  Administered 2013-05-05 (×3): 0.25 mg via INTRAVENOUS

## 2013-05-05 MED ORDER — BUPIVACAINE HCL 0.25 % IJ SOLN
INTRAMUSCULAR | Status: DC | PRN
  Administered 2013-05-05: 20 mL

## 2013-05-05 MED ORDER — FLUOXETINE HCL 10 MG PO CAPS
10.0000 mg | ORAL_CAPSULE | Freq: Every morning | ORAL | Status: DC
  Filled 2013-05-05: qty 1

## 2013-05-05 MED ORDER — LACTATED RINGERS IR SOLN
Status: DC | PRN
  Administered 2013-05-05: 1000 mL

## 2013-05-05 MED ORDER — LACTATED RINGERS IV SOLN
INTRAVENOUS | Status: DC
  Administered 2013-05-05: 1000 mL via INTRAVENOUS
  Administered 2013-05-05: 12:00:00 via INTRAVENOUS

## 2013-05-05 MED ORDER — ONDANSETRON HCL 4 MG/2ML IJ SOLN: 4.0000 mg | Freq: Four times a day (QID) | INTRAMUSCULAR | Status: DC | PRN

## 2013-05-05 MED ORDER — HYDROCODONE-ACETAMINOPHEN 5-325 MG PO TABS
1.0000 | ORAL_TABLET | ORAL | Status: DC | PRN
  Administered 2013-05-06 (×2): 2 via ORAL
  Filled 2013-05-05 (×2): qty 2

## 2013-05-05 MED ORDER — LIDOCAINE HCL (CARDIAC) 20 MG/ML IV SOLN
INTRAVENOUS | Status: DC | PRN
  Administered 2013-05-05: 80 mg via INTRAVENOUS

## 2013-05-05 MED ORDER — MENTHOL 3 MG MT LOZG
1.0000 | LOZENGE | OROMUCOSAL | Status: DC | PRN
  Administered 2013-05-05: 3 mg via ORAL
  Filled 2013-05-05 (×3): qty 9

## 2013-05-05 MED ORDER — METOPROLOL SUCCINATE ER 25 MG PO TB24
25.0000 mg | ORAL_TABLET | Freq: Every morning | ORAL | Status: DC
  Administered 2013-05-06: 25 mg via ORAL
  Filled 2013-05-05: qty 1

## 2013-05-05 MED ORDER — LIDOCAINE-EPINEPHRINE 1 %-1:100000 IJ SOLN
INTRAMUSCULAR | Status: DC | PRN
  Administered 2013-05-05: 20 mL

## 2013-05-05 MED ORDER — SUFENTANIL CITRATE 50 MCG/ML IV SOLN
INTRAVENOUS | Status: DC | PRN
  Administered 2013-05-05 (×2): 5 ug via INTRAVENOUS
  Administered 2013-05-05: 10 ug via INTRAVENOUS
  Administered 2013-05-05: 20 ug via INTRAVENOUS
  Administered 2013-05-05: 10 ug via INTRAVENOUS

## 2013-05-05 MED ORDER — ONDANSETRON HCL 4 MG/2ML IJ SOLN
INTRAMUSCULAR | Status: DC | PRN
  Administered 2013-05-05: 4 mg via INTRAVENOUS

## 2013-05-05 MED ORDER — DEXAMETHASONE SODIUM PHOSPHATE 10 MG/ML IJ SOLN
INTRAMUSCULAR | Status: DC | PRN
  Administered 2013-05-05: 10 mg via INTRAVENOUS

## 2013-05-05 MED ORDER — ACETAMINOPHEN 10 MG/ML IV SOLN
INTRAVENOUS | Status: AC
  Filled 2013-05-05: qty 100

## 2013-05-05 MED ORDER — ERTAPENEM SODIUM 1 G IJ SOLR
1.0000 g | INTRAMUSCULAR | Status: AC
  Administered 2013-05-05: 1 g via INTRAVENOUS
  Filled 2013-05-05: qty 1

## 2013-05-05 MED ORDER — MORPHINE SULFATE 2 MG/ML IJ SOLN
1.0000 mg | INTRAMUSCULAR | Status: DC | PRN
  Administered 2013-05-05: 2 mg via INTRAVENOUS
  Administered 2013-05-05 – 2013-05-06 (×3): 4 mg via INTRAVENOUS
  Filled 2013-05-05: qty 2
  Filled 2013-05-05: qty 1
  Filled 2013-05-05 (×2): qty 2

## 2013-05-05 MED ORDER — ATROPINE SULFATE 0.4 MG/ML IJ SOLN
INTRAMUSCULAR | Status: DC | PRN
  Administered 2013-05-05: 0.4 mg via INTRAVENOUS

## 2013-05-05 MED ORDER — SUCCINYLCHOLINE CHLORIDE 20 MG/ML IJ SOLN
INTRAMUSCULAR | Status: DC | PRN
  Administered 2013-05-05: 100 mg via INTRAVENOUS

## 2013-05-05 MED ORDER — CISATRACURIUM BESYLATE (PF) 10 MG/5ML IV SOLN
INTRAVENOUS | Status: DC | PRN
  Administered 2013-05-05 (×3): 2 mg via INTRAVENOUS
  Administered 2013-05-05: 6 mg via INTRAVENOUS

## 2013-05-05 MED ORDER — LIDOCAINE-EPINEPHRINE 1 %-1:100000 IJ SOLN
INTRAMUSCULAR | Status: AC
  Filled 2013-05-05: qty 1

## 2013-05-05 SURGICAL SUPPLY — 50 items
APPLICATOR COTTON TIP 6IN STRL (MISCELLANEOUS) IMPLANT
APPLIER CLIP ROT 10 11.4 M/L (STAPLE)
CABLE HIGH FREQUENCY MONO STRZ (ELECTRODE) IMPLANT
CANISTER SUCTION 2500CC (MISCELLANEOUS) ×3 IMPLANT
CHLORAPREP W/TINT 26ML (MISCELLANEOUS) ×6 IMPLANT
CLIP APPLIE ROT 10 11.4 M/L (STAPLE) IMPLANT
CLOTH BEACON ORANGE TIMEOUT ST (SAFETY) ×3 IMPLANT
DERMABOND ADVANCED (GAUZE/BANDAGES/DRESSINGS)
DERMABOND ADVANCED .7 DNX12 (GAUZE/BANDAGES/DRESSINGS) IMPLANT
DEVICE SUTURE ENDOST 10MM (ENDOMECHANICALS) IMPLANT
DRAIN CHANNEL 19F RND (DRAIN) ×3 IMPLANT
DRAPE LAPAROSCOPIC ABDOMINAL (DRAPES) ×3 IMPLANT
DRAPE UTILITY 15X26 (DRAPE) ×6 IMPLANT
ELECT REM PT RETURN 9FT ADLT (ELECTROSURGICAL) ×3
ELECTRODE REM PT RTRN 9FT ADLT (ELECTROSURGICAL) ×2 IMPLANT
EVACUATOR DRAINAGE 10X20 100CC (DRAIN) ×2 IMPLANT
EVACUATOR SILICONE 100CC (DRAIN) ×4 IMPLANT
GLOVE SURG SS PI 7.5 STRL IVOR (GLOVE) ×6 IMPLANT
GOWN STRL NON-REIN LRG LVL3 (GOWN DISPOSABLE) ×3 IMPLANT
GOWN STRL REIN XL XLG (GOWN DISPOSABLE) ×9 IMPLANT
HANDLE STAPLE EGIA 4 XL (STAPLE) IMPLANT
HOVERMATT SINGLE USE (MISCELLANEOUS) ×3 IMPLANT
KIT BASIN OR (CUSTOM PROCEDURE TRAY) ×3 IMPLANT
MARKER SKIN DUAL TIP RULER LAB (MISCELLANEOUS) ×3 IMPLANT
NEEDLE SPNL 22GX3.5 QUINCKE BK (NEEDLE) ×3 IMPLANT
NS IRRIG 1000ML POUR BTL (IV SOLUTION) ×3 IMPLANT
PENCIL BUTTON HOLSTER BLD 10FT (ELECTRODE) ×3 IMPLANT
POUCH SPECIMEN RETRIEVAL 10MM (ENDOMECHANICALS) IMPLANT
RELOAD EGIA 60 MED/THCK PURPLE (STAPLE) IMPLANT
RELOAD TRI 2.0 60 XTHK VAS SUL (STAPLE) IMPLANT
SCISSORS LAP 5X35 DISP (ENDOMECHANICALS) IMPLANT
SEALANT SURGICAL APPL DUAL CAN (MISCELLANEOUS) ×3 IMPLANT
SET IRRIG TUBING LAPAROSCOPIC (IRRIGATION / IRRIGATOR) ×3 IMPLANT
SHEARS CURVED HARMONIC AC 45CM (MISCELLANEOUS) ×3 IMPLANT
SLEEVE ENDOPATH XCEL 5M (ENDOMECHANICALS) ×9 IMPLANT
SOLUTION ANTI FOG 6CC (MISCELLANEOUS) ×3 IMPLANT
SPONGE GAUZE 4X4 12PLY (GAUZE/BANDAGES/DRESSINGS) IMPLANT
SPONGE LAP 18X18 X RAY DECT (DISPOSABLE) ×3 IMPLANT
SUT ETHILON 2 0 PS N (SUTURE) ×3 IMPLANT
SUT MNCRL AB 4-0 PS2 18 (SUTURE) ×6 IMPLANT
SUT PDS AB 0 CT1 36 (SUTURE) ×3 IMPLANT
SUT VICRYL 0 UR6 27IN ABS (SUTURE) ×3 IMPLANT
SYR 50ML LL SCALE MARK (SYRINGE) ×3 IMPLANT
TRAY FOLEY CATH 14FRSI W/METER (CATHETERS) ×3 IMPLANT
TRAY LAP CHOLE (CUSTOM PROCEDURE TRAY) ×3 IMPLANT
TROCAR BLADELESS 15MM (ENDOMECHANICALS) ×3 IMPLANT
TROCAR BLADELESS OPT 5 100 (ENDOMECHANICALS) ×3 IMPLANT
TUBING CONNECTING 10 (TUBING) ×3 IMPLANT
TUBING ENDO SMARTCAP (MISCELLANEOUS) ×3 IMPLANT
TUBING FILTER THERMOFLATOR (ELECTROSURGICAL) ×3 IMPLANT

## 2013-05-05 NOTE — Op Note (Signed)
PROCEDURE NOTE  Procedure: Intraoperative upper endoscopy  Description: Intraoperative upper endoscopy is performed at the completion of gastric band removal by Dr. Darylene Price. The video endoscope was introduced into the upper esophagus and then passed under direct vision to the EG junction. The esophagus appeared normal. The stomach was entered and there was no evidence of stricture or restriction at the site of the previous band. The stomach was distended and the mucosa appeared normal. The scope was not advanced beyond the pylorus for the purposes of this study. Retroflexed view showed normal fundus and no evidence of any further plication. The scope was then carefully withdrawn through the previous band site and again noted no evidence of persistent restriction for plication. The stomach was then desufflated and the scope withdrawn.  Mariella Saa MD, FACS  05/05/2013, 1:57 PM

## 2013-05-05 NOTE — Interval H&P Note (Signed)
History and Physical Interval Note:  05/05/2013 10:49 AM  Debra Carpenter  has presented today for surgery, with the diagnosis of morbid obesity  The various methods of treatment have been discussed with the patient and family. After consideration of risks, benefits and other options for treatment, the patient has consented to  Procedure(s) with comments: LAPAROSCOPIC REMOVAL OF ADJUSTABLE GASTRIC BAND (N/A) - laparoscopic removal of adjustable gastric band with possible sleeve gastrectomy POSSIBLE SLEEVE GASTRECTOMY  (N/A) as a surgical intervention .  The patient's history has been reviewed, patient examined, no change in status, stable for surgery.  I have reviewed the patient's chart and labs.  Questions were answered to the patient's satisfaction.  Pt seen and evaluated in the preop area.  She denies any further reflux.  She remains interested in removal of her band and conversion to sleeve gastrectomy.  I again explained to her the increased risk of complications such as leaks associated with revisional surgery and immediate conversion to sleeve gastrectomy  And she expressed understanding of this.  I explained that we may or may not perform the sleeve gastrectomy today and I am leaning toward not doing it and she knows this.  We discussed the risks of the procedure.  The risks of infection, bleeding, pain, scarring, weight regain, too little or too much weight loss, vitamin deficiencies and need for lifelong vitamin supplementation, hair loss, need for protein supplementation, leaks, stricture, reflux, food intolerance, need for reoperation and conversion to roux Y gastric bypass, need for open surgery, injury to spleen or surrounding structures, DVT's, PE, and death again discussed with the patient and the patient expressed understanding and desires to proceed with removal of adjustable gastric banding and laparoscopic vertical sleeve gastrectomy, possible open, intraoperative endoscopy.  Lodema Pilot DAVID

## 2013-05-05 NOTE — Brief Op Note (Signed)
05/05/2013  2:03 PM  PATIENT:  Sheran Spine  47 y.o. female  PRE-OPERATIVE DIAGNOSIS:  morbid obesity  POST-OPERATIVE DIAGNOSIS:  MORBID OBESITY  PROCEDURE:  Procedure(s) with comments: LAPAROSCOPIC REMOVAL OF ADJUSTABLE GASTRIC BAND AND PORT, ENDOSCOPY  (N/A) - laparoscopic removal of adjustable gastric band AND PORT   SURGEON:  Surgeon(s) and Role:    * Lodema Pilot, DO - Primary    * Mariella Saa, MD - Assisting  PHYSICIAN ASSISTANT:   ASSISTANTS: Hoxworth   ANESTHESIA:   general  EBL:  Total I/O In: 1750 [I.V.:1750] Out: 275 [Urine:225; Blood:50]  BLOOD ADMINISTERED:none  DRAINS: none   LOCAL MEDICATIONS USED:  MARCAINE    and LIDOCAINE   SPECIMEN:  Source of Specimen:  gastric band (not sent to pathology)  DISPOSITION OF SPECIMEN:  N/A  COUNTS:  YES  TOURNIQUET:  * No tourniquets in log *  DICTATION: .Other Dictation: Dictation Number [id number]  PLAN OF CARE: Admit for overnight observation  PATIENT DISPOSITION:  PACU - hemodynamically stable.   Delay start of Pharmacological VTE agent (>24hrs) due to surgical blood loss or risk of bleeding: no

## 2013-05-05 NOTE — Transfer of Care (Signed)
Immediate Anesthesia Transfer of Care Note  Patient: Debra Carpenter  Procedure(s) Performed: Procedure(s) with comments: LAPAROSCOPIC REMOVAL OF ADJUSTABLE GASTRIC BAND AND PORT, ENDOSCOPY  (N/A) - laparoscopic removal of adjustable gastric band AND PORT   Patient Location: PACU  Anesthesia Type:General  Level of Consciousness: awake, alert  and oriented  Airway & Oxygen Therapy: Patient Spontanous Breathing and Patient connected to face mask oxygen  Post-op Assessment: Report given to PACU RN and Post -op Vital signs reviewed and stable  Post vital signs: Reviewed and stable  Complications: No apparent anesthesia complications

## 2013-05-05 NOTE — Progress Notes (Signed)
Doing okay.  Most pain at the port removal site but appropriate.  Vitals stable

## 2013-05-05 NOTE — Anesthesia Preprocedure Evaluation (Signed)
Anesthesia Evaluation  Patient identified by MRN, date of birth, ID band Patient awake    Reviewed: Allergy & Precautions, H&P , NPO status , Patient's Chart, lab work & pertinent test results  Airway Mallampati: II TM Distance: >3 FB Neck ROM: full    Dental no notable dental hx. (+) Teeth Intact and Dental Advisory Given   Pulmonary neg pulmonary ROS,  breath sounds clear to auscultation  Pulmonary exam normal       Cardiovascular Exercise Tolerance: Good hypertension, + angina Rhythm:regular Rate:Normal  Atypical CP   Neuro/Psych negative neurological ROS  negative psych ROS   GI/Hepatic negative GI ROS, Neg liver ROS, GERD-  Medicated and Controlled,  Endo/Other  negative endocrine ROS  Renal/GU negative Renal ROS  negative genitourinary   Musculoskeletal   Abdominal   Peds  Hematology negative hematology ROS (+)   Anesthesia Other Findings   Reproductive/Obstetrics negative OB ROS                           Anesthesia Physical Anesthesia Plan  ASA: II  Anesthesia Plan: General   Post-op Pain Management:    Induction: Intravenous  Airway Management Planned: Oral ETT  Additional Equipment:   Intra-op Plan:   Post-operative Plan: Extubation in OR  Informed Consent: I have reviewed the patients History and Physical, chart, labs and discussed the procedure including the risks, benefits and alternatives for the proposed anesthesia with the patient or authorized representative who has indicated his/her understanding and acceptance.   Dental Advisory Given  Plan Discussed with: CRNA and Surgeon  Anesthesia Plan Comments:         Anesthesia Quick Evaluation

## 2013-05-05 NOTE — Anesthesia Postprocedure Evaluation (Signed)
  Anesthesia Post-op Note  Patient: Debra Carpenter  Procedure(s) Performed: Procedure(s) (LRB): LAPAROSCOPIC REMOVAL OF ADJUSTABLE GASTRIC BAND AND PORT, ENDOSCOPY  (N/A)  Patient Location: PACU  Anesthesia Type: General  Level of Consciousness: awake and alert   Airway and Oxygen Therapy: Patient Spontanous Breathing  Post-op Pain: mild  Post-op Assessment: Post-op Vital signs reviewed, Patient's Cardiovascular Status Stable, Respiratory Function Stable, Patent Airway and No signs of Nausea or vomiting  Last Vitals:  Filed Vitals:   05/05/13 1545  BP: 139/82  Pulse: 51  Temp: 36.5 C  Resp: 18    Post-op Vital Signs: stable   Complications: No apparent anesthesia complications

## 2013-05-05 NOTE — Anesthesia Procedure Notes (Addendum)
Procedure Name: Intubation Performed by: Florene Route Pre-anesthesia Checklist: Patient identified, Emergency Drugs available, Suction available, Patient being monitored and Timeout performed Patient Re-evaluated:Patient Re-evaluated prior to inductionOxygen Delivery Method: Circle system utilized Preoxygenation: Pre-oxygenation with 100% oxygen Intubation Type: IV induction, Rapid sequence and Cricoid Pressure applied Laryngoscope Size: Miller and 2 Grade View: Grade I Tube type: Oral Tube size: 7.5 mm Number of attempts: 1 Airway Equipment and Method: Stylet Placement Confirmation: ETT inserted through vocal cords under direct vision,  positive ETCO2 and breath sounds checked- equal and bilateral Secured at: 22 cm Tube secured with: Tape Dental Injury: Teeth and Oropharynx as per pre-operative assessment  Comments: Induction and Intubation performed by [facility]

## 2013-05-05 NOTE — H&P (View-Only) (Signed)
Patient ID: Debra Carpenter, female   DOB: [birth year], 47 y.o.   MRN: [id number]  Chief Complaint  Patient presents with  . Bariatric Pre-op    lap band removal and gastric sleeve placement    HPI Debra Carpenter is a 47 y.o. female.  This patient is known to me for prior evaluation for possible sleeve gastrectomy after removal of an adjustable gastric band. She had a realize band placed in [place] and really has not been able to make this work. We have tried to adjust her in the been unsuccessful with this. She has an esophageal dilation as well which has made this difficult to adjust. She remains interested in vertical sleeve gastrectomy and says that since our last visit she has not had any reflux symptoms. HPI  Past Medical History  Diagnosis Date  . GERD (gastroesophageal reflux disease)   . Hypertension   . Anxiety and depression   . Lung nodule 08/24/2011  . Bony sclerosis 08/22/2011    Past Surgical History  Procedure Laterality Date  . Laparoscopic gastric banding    . Abdominal hysterectomy  2007    Family History  Problem Relation Age of Onset  . Hypertension Mother   . Hypertension Father   . Cancer Father     ? colon cancer  . Cancer Maternal Grandmother     ovarian cancer  . Hypertension Maternal Grandfather   . Hypertension Paternal Grandfather     Social History History  Substance Use Topics  . Smoking status: Never Smoker   . Smokeless tobacco: Never Used  . Alcohol Use: No    No Known Allergies  Current Outpatient Prescriptions  Medication Sig Dispense Refill  . buPROPion (WELLBUTRIN XL) 150 MG 24 hr tablet Take 150 mg by mouth every morning.       . cetirizine (ZYRTEC) 10 MG tablet Take 10 mg by mouth daily.      Marland Kitchen FLUoxetine (PROZAC) 10 MG capsule Take 10 mg by mouth every morning.      . metoprolol succinate (TOPROL-XL) 25 MG 24 hr tablet Take 25 mg by mouth every morning.        No current facility-administered medications for this visit.     Review of Systems Review of Systems All other review of systems negative or noncontributory except as stated in the HPI  Blood pressure 126/92, pulse 55, temperature 96 F (35.6 C), temperature source Temporal, resp. rate 16, height 5\' 9"  (1.753 m), weight 288 lb 12.8 oz (130.999 kg), SpO2 98.00%.  Physical Exam Physical Exam Physical Exam  Nursing note and vitals reviewed. Constitutional: She is oriented to person, place, and time. She appears well-developed and well-nourished. No distress.  HENT:  Head: Normocephalic and atraumatic.  Mouth/Throat: No oropharyngeal exudate.  Eyes: Conjunctivae and EOM are normal. Pupils are equal, round, and reactive to light. Right eye exhibits no discharge. Left eye exhibits no discharge. No scleral icterus.  Neck: Normal range of motion. Neck supple. No tracheal deviation present.  Cardiovascular: Normal rate, regular rhythm, normal heart sounds and intact distal pulses.   Pulmonary/Chest: Effort normal and breath sounds normal. No stridor. No respiratory distress. She has no wheezes.  Abdominal: Soft. Bowel sounds are normal. She exhibits no distension and no mass. There is no tenderness. There is no rebound and no guarding.  Musculoskeletal: Normal range of motion. She exhibits no edema and no tenderness.  Neurological: She is alert and oriented to person, place, and time.  Skin: Skin  is warm and dry. No rash noted. She is not diaphoretic. No erythema. No pallor.  Psychiatric: She has a normal mood and affect. Her behavior is normal. Judgment and thought content normal.    Data Reviewed   Assessment    Morbid obesity She remains interested in removal of her adjustable gastric band and conversion to sleeve gastrectomy. We again discussed the possible scenarios of simply removing her band of versus band removal and conversion to sleeve gastrectomy. She understands that this will be intraoperative decision. I again explained that it is higher  risk to perform this in one stage procedure and she expressed understanding of this and still would like to proceed with a one stage procedure if possible despite additional risk. She also understands there risk of possibly creating reflux and worsening reflux necessitating lifelong medication or possible conversion to gastric bypass. We discussed the other risks of the procedure as well and she desires to proceed with removal of her adjustable gastric band with possible conversion to sleeve gastrectomy     Plan     we'll proceed with the surgery as planned on June 10        Lodema Pilot DAVID 04/24/2013, 9:30 AM

## 2013-05-06 ENCOUNTER — Encounter (HOSPITAL_COMMUNITY): Payer: Self-pay | Admitting: General Surgery

## 2013-05-06 LAB — BASIC METABOLIC PANEL
BUN: 6 mg/dL (ref 6–23)
CO2: 26 mEq/L (ref 19–32)
Calcium: 8.6 mg/dL (ref 8.4–10.5)
Chloride: 102 mEq/L (ref 96–112)
Creatinine, Ser: 0.53 mg/dL (ref 0.50–1.10)
GFR calc non Af Amer: >90 mL/min (ref >90)

## 2013-05-06 LAB — CBC
HCT: 35.4 % — ABNORMAL LOW (ref 36.0–46.0)
Hemoglobin: 12.1 g/dL (ref 12.0–15.0)
MCH: 28.5 pg (ref 26.0–34.0)
MCV: 83.5 fL (ref 78.0–100.0)
Platelets: 287 10*3/uL (ref 150–400)
RDW: 12.8 % (ref 11.5–15.5)
WBC: 8.1 10*3/uL (ref 4.0–10.5)

## 2013-05-06 MED ORDER — HYDROCODONE-ACETAMINOPHEN 5-325 MG PO TABS: 1.0000 | ORAL_TABLET | ORAL | Status: DC | PRN

## 2013-05-06 NOTE — Op Note (Signed)
NAMEMALY, LEMARR NO.:  [id number]  MEDICAL RECORD NO.:  [id number]  LOCATION:  1523                         FACILITY:  [facility]  PHYSICIAN:  Lodema Pilot, MD       DATE OF BIRTH:  27-Sep-1966  DATE OF PROCEDURE:  05/05/2013 DATE OF DISCHARGE:                              OPERATIVE REPORT   PROCEDURE:  Laparoscopic removal of adjustable gastric band, and intraoperative upper endoscopy.  PREOPERATIVE DIAGNOSIS:  Morbid obesity.  POSTOP DIAGNOSIS:  Morbid obesity.  SURGEON:  Lodema Pilot, MD  ASSISTANT:  Lorne Skeens. Hoxworth, M.D.  ANESTHESIA:  General endotracheal anesthesia with 40 mL of 1% lidocaine with epinephrine and 0.25% Marcaine in a 50:50 mixture.  FLUIDS:  1600 mL of crystalloid.  ESTIMATED BLOOD LOSS:  Minimal.  DRAINS:  None.  SPECIMENS:  Adjustable gastric band removed but not sent to Pathology.  COMPLICATIONS:  None apparent.  FINDINGS:  Inflammatory changes around the band and tubing.  The stomach and band were closely adhered to the liver and the liver capsule.  The released band was removed and the capsule was opened up and the fundoplication taken down, intraoperative endoscopy confirmed removal of the band without any further restriction on the stomach and no evidence of intraoperative leakage intraoperatively.  INDICATION FOR PROCEDURE:  Ms. Debra Carpenter is a 48 year old female, who had realized adjustable gastric band placed in [place] and has not  had any success getting this to work.  She has seen the nutritionist and exercise frequently and still cannot get any significant restriction of the band.  She actually had some esophageal dilation and fluid was let out and then she decided that she no longer wanted to have the band and look at other options such as vertical sleeve gastrectomy.  OPERATIVE DETAILS:  Ms. Debra Carpenter was seen and evaluated in the preoperative area, and risks and benefits of the procedure were  again discussed in lay terms.  Informed consent was obtained.  I again explained to her that I most likely would only remove the band.  We were also considering sleeve gastrectomy in the past, which she wanted ideally.  I explained that we would secure due to it was 1 stage operation, and would only perform this if the stomach looked very pristine after removal of the band.  She was given prophylactic antibiotics and subcutaneous heparin, taken to the operating room, placed on table in supine position.  General endotracheal anesthesia obtained and Foley catheter was placed.  Her abdomen was prepped and draped in the standard surgical fashion.  Procedure time-out was performed with all operative team members to confirm proper patient, procedure, and a 5-mm Optiview trocar was used to access the abdomen and the left upper quadrant.  Pneumoperitoneum was obtained and the laparoscope was introduced.  There was no evidence of bleeding or bowel injury upon entry.  A 5-mm right upper quadrant port was placed under direct visualization and a 5-mm right rectus port was placed as well. This allowed [place] to take down the adhesions from the omentum which were adhered to the abdominal wall in the area of the port tubing, where the port was exiting the abdomen.  These were taken down easily and another 5-mm port was placed in this area.  The 5-mm incision was made in the epigastrium and Nathanson liver retractor was used to retract the left lobe of the liver.  She had a very large and floppy liver which continued hang down in front of the camera throughout the case.  We followed the tubing towards the buccal of the band.  She had a significant amount of fat which was in the stomach in the areas of the band which was adhered to the undersurface of the left lobe of the liver.  Using sharp dissection, I took down the fat and stomach from the liver separated this and some of the liver capsule was taken off  during the dissection in order to avoid injury to the stomach and esophagus. Some Harmonic scalpel was used as well, where I could see that the stomach was well away from the heat.  After I took this down from the liver, I was able to follow the band tubing towards to the actual band along the lesser curve, side of the stomach and I was able to identify the buccal and free up the buccal.  However, I could not unbuccal the band, so buccal was cut and this opened up the band.  The band was removed from the capsule of the tunnel around the stomach and esophagus. All of the pieces of the band and the buccal were removed and under direct visualization to ensure that we had removed all of the pieces. We were then able to enter into the capsule and takedown using sharp dissection.  The fundoplication, I did not use any Harmonic scalpel for this and only sharp dissection was used.  Fundoplication was actually fairly flimsy and it was easily taken down.  There appeared to be some green Surgidac sutures securing the stomach.  There also was what appeared to be another Surgidac suture on the lesser curve of the stomach in the place that I would typically place an anti-slip stitch. When we did the upper endoscopy and insufflation this seem to be tethering the stomach and constricting the stomach a little bit.  So, I released the suture in this band as well.  The fundoplication appeared to be completely down and we laid out the stomach in its usual position. Dr. Johna Sheriff performed upper endoscopy and passed the scope down the esophagus into the stomach, insufflated the stomach and visualized the stomach from the inside.  There was no evidence of mucosal injury and no evidence of residual fundoplication endoscopically.  He insufflated the stomach and took down this was band on the lesser curve which appeared to be tethering the stomach somewhat and there was some tightness under water while he was  insufflating.  There was no evidence of any leakage or any evidence of full-thickness injury.  Again, the stomach appeared to blow up and it felt like he had completely removed the fundoplication.  He suctioned the remainder of the air from the stomach, and we contemplated performing the sleeve gastrectomy but the lesser curvature of the stomach was buried in the area of the band and the capsule was very thick and there were few serosal defects and I felt that it would be best and safest for the patient to perform this at a later date, although I know that she had her hopes that settling a 1 stage procedure.  We did discuss with her and I explained to her several times previously that  unless the stomach looked fairly pristine after taking down the fundoplication that would lead on performing a 2 stage procedure.  Then, we inspected the abdomen for any bleeding and the hemostasis was noted be adequate and suctioned the fluid from the abdomen.  We removed the liver retractor and the ports were removed under direct visualization.  Abdominal wall was noted be hemostatic.  I then opened up the previous port incision and cut down to her port and entered the capsule wound right around her ports elevating the tubing and removing the tubing from the abdomen.  I undermined the port which was very tightly adhered to the fascia, but I was able to free up all of the fastening hooks from the fascia and removed the port completely.  I did not enter through the posterior fascia, however, I did and there was no full-thickness defects.  However, I did put 0 PDS figure-of-eight suture  through what looked like the anterior fascia for added security and minimize hernia formation.  The wound was then irrigated with sterile saline solution and I reinsufflated the abdomen through the right upper quadrant port, and the closure was noted to be adequate without any evidence of bowel injury.  There was no  full-thickness injury to the abdomen.  The remainder of the abdomen appeared hemostatic without evidence of bleeding or bowel injury and the gas was removed from right upper quadrant.  Port was removed.  The wounds were injected with a total of 40 mL of 1% lidocaine with epinephrine, 0.25% Marcaine in a 50:50 mixture.  The wounds were approximated with 4-0 Monocryl subcuticular suture.  Skin was washed and dried, and Dermabond was applied.  All sponge, needle, and instrument counts were correct at the end of the case.  The patient tolerated the procedure well without apparent complications.          ______________________________ Lodema Pilot, MD     BL/MEDQ  D:  05/05/2013  T:  05/06/2013  Job:  [id number]

## 2013-05-06 NOTE — Discharge Summary (Signed)
  Physician Discharge Summary  Patient ID: Debra Carpenter MRN: [id number] DOB/AGE: 48/16/1967 48 y.o.  Admit date: 05/05/2013 Discharge date: 05/06/2013  Admission Diagnoses:obesity  Discharge Diagnoses: same Active Problems:   * No active hospital problems. *   Discharged Condition: stable  Hospital Course: to OR 05/05/13 for removal of gastric band.  Kept in hospital for observation.  No issues overnight and diet advanced.  Discharged on POD 1  Consults: None  Significant Diagnostic Studies: none  Treatments: surgery: 05/05/13 removal of gastric band  Disposition: 01-Home or Self Care  Discharge Orders   Future Appointments Provider Department Dept Phone   05/19/2013 4:00 PM [facility] Post-Op Class [facility] [phone]   05/28/2013 2:30 PM Lodema Pilot, DO [facility], [place] [phone]   Future Orders Complete By Expires     Call MD for:  difficulty breathing, headache or visual disturbances  As directed     Call MD for:  persistant nausea and vomiting  As directed     Call MD for:  redness, tenderness, or signs of infection (pain, swelling, redness, odor or green/yellow discharge around incision site)  As directed     Call MD for:  severe uncontrolled pain  As directed     Call MD for:  temperature >100.4  As directed     Discharge instructions  As directed     Comments:      Follow up with Dr. Biagio Quint in 2-3 weeks or sooner as needed May shower tomorrow. Full liquid diet x2 days, then soft diet x3 day, then gradually increase as tolerated to high protein, low fat diet.    Increase activity slowly  As directed         Medication List    TAKE these medications       buPROPion 150 MG 24 hr tablet  Commonly known as:  WELLBUTRIN XL  Take 150 mg by mouth every morning.     cetirizine 10 MG tablet  Commonly known as:  ZYRTEC  Take 10 mg by mouth daily.     FLUoxetine 10 MG capsule  Commonly known as:  PROZAC  Take  10 mg by mouth every morning.     HYDROcodone-acetaminophen 5-325 MG per tablet  Commonly known as:  NORCO/VICODIN  Take 1 tablet by mouth every 4 (four) hours as needed for pain.     metoprolol succinate 25 MG 24 hr tablet  Commonly known as:  TOPROL-XL  Take 25 mg by mouth every morning. See progress note dated 04/29/13         Signed: Lodema Pilot DAVID 05/06/2013, 8:32 AM

## 2013-05-06 NOTE — Progress Notes (Signed)
1 Day Post-Op  Subjective: No issues.  Pain controlled   Objective: Vital signs in last 24 hours: Temp:  [97.7 F (36.5 C)-98.7 F (37.1 C)] 98.6 F (37 C) (06/11 0514) Pulse Rate:  [49-67] 57 (06/11 0514) Resp:  [12-20] 18 (06/11 0514) BP: (114-153)/(52-91) 116/52 mmHg (06/11 0514) SpO2:  [96 %-100 %] 98 % (06/11 0514) Weight:  [285 lb 6 oz (129.445 kg)] 285 lb 6 oz (129.445 kg) (06/10 1444) Last BM Date: 05/05/13  Intake/Output from previous day: 06/10 0701 - 06/11 0700 In: 3160 [I.V.:3160] Out: 1475 [Urine:1425; Blood:50] Intake/Output this shift:    General appearance: alert, cooperative and no distress Resp: clear to auscultation bilaterally Cardio: normal rate, regular GI: soft, mild incisional tenderness, ND, no peritoneal signs, incisions without infection  Lab Results:   Recent Labs  05/06/13 0345  WBC 8.1  HGB 12.1  HCT 35.4*  PLT 287   BMET  Recent Labs  05/06/13 0345  NA 134*  K 3.9  CL 102  CO2 26  GLUCOSE 143*  BUN 6  CREATININE 0.53  CALCIUM 8.6   PT/INR No results found for this basename: LABPROT, INR,  in the last 72 hours ABG No results found for this basename: PHART, PCO2, PO2, HCO3,  in the last 72 hours  Studies/Results: No results found.  Anti-infectives: Anti-infectives   Start     Dose/Rate Route Frequency Ordered Stop   05/05/13 0845  ertapenem (INVANZ) 1 g in sodium chloride 0.9 % 50 mL IVPB     1 g 100 mL/hr over 30 Minutes Intravenous On call to O.R. 05/05/13 0845 05/05/13 1108      Assessment/Plan: s/p Procedure(s) with comments: LAPAROSCOPIC REMOVAL OF ADJUSTABLE GASTRIC BAND AND PORT, ENDOSCOPY  (N/A) - laparoscopic removal of adjustable gastric band AND PORT  Discharge she looks okay.  pain controlled.  postop diet discussed with the patient.  she should be okay for discharge today  LOS: 1 day    Lodema Pilot DAVID 05/06/2013

## 2013-05-06 NOTE — Care Management Note (Signed)
    Page 1 of 1   05/06/2013     10:46:58 AM   CARE MANAGEMENT NOTE 05/06/2013  Patient:  Debra Carpenter, Debra Carpenter   Account Number:  [id number]  Date Initiated:  05/06/2013  Documentation initiated by:  Lorenda Ishihara  Subjective/Objective Assessment:   47 yo female admitted s/p lap band removal. PTA lived at home with sister.     Action/Plan:   Home when stable   Anticipated DC Date:  05/06/2013   Anticipated DC Plan:  HOME/SELF CARE      DC Planning Services  CM consult      Choice offered to / List presented to:             Status of service:  Completed, signed off Medicare Important Message given?   (If response is "NO", the following Medicare IM given date fields will be blank) Date Medicare IM given:   Date Additional Medicare IM given:    Discharge Disposition:  HOME/SELF CARE  Per UR Regulation:  Reviewed for med. necessity/level of care/duration of stay  If discussed at Long Length of Stay Meetings, dates discussed:    Comments:

## 2013-05-18 ENCOUNTER — Telehealth (INDEPENDENT_AMBULATORY_CARE_PROVIDER_SITE_OTHER): Payer: Self-pay | Admitting: General Surgery

## 2013-05-18 NOTE — Telephone Encounter (Signed)
Patient called stated, she felt like something was "sticking" her from the inside out. Denies fever. BM normal. Nauseated for the first time this a.m. Lying makes this better, bending/leaning over makes it worse. She noticed her ankles/feet were starting to swell, she states she hasn't had this problem since Lap Band was placed. Patient understand message is being sent to [facility] and when I have a response I will call her back.

## 2013-05-19 ENCOUNTER — Ambulatory Visit: Payer: BC Managed Care – PPO

## 2013-05-28 ENCOUNTER — Ambulatory Visit (INDEPENDENT_AMBULATORY_CARE_PROVIDER_SITE_OTHER): Payer: BC Managed Care – PPO | Admitting: General Surgery

## 2013-05-28 ENCOUNTER — Encounter (INDEPENDENT_AMBULATORY_CARE_PROVIDER_SITE_OTHER): Payer: Self-pay | Admitting: General Surgery

## 2013-05-28 DIAGNOSIS — Z6841 Body Mass Index (BMI) 40.0 and over, adult: Secondary | ICD-10-CM

## 2013-05-28 NOTE — Progress Notes (Signed)
Subjective:     Patient ID: Debra Carpenter, female   DOB: [birth year], 47 y.o.   MRN: [id number]  HPI This patient follows up one month status post removal of adjustable gastric band. She's doing well and has no complaints. Her abdominal pain has resolved. She denies any reflux. She has gained 3 pounds. She has resumed her exercise and is scheduled to run a 5K the first week of September  Review of Systems     Objective:   Physical Exam Her abdomen is soft nontender and examined her incisions are healing nicely without any sign of infection    Assessment:     Obesity status post removal of adjustable gastric band She seems to be recovered very nicely from her procedure and has no evidence of any postoperative complications. She would like to continue with the sleeve gastrectomy to achieve weight loss. She says that she has not had any reflux symptoms. Again discussed with her the procedure and its risks including the risks of worsening reflux and she insists on proceeding with a sleeve gastrectomy. I would like to wait 3 months from band removal prior to scheduling her sleeve gastrectomy     Plan:     I recommended that she continue with attempts at weight loss and exercise and we will schedule her for sleeve gastrectomy in September

## 2013-06-04 ENCOUNTER — Other Ambulatory Visit (INDEPENDENT_AMBULATORY_CARE_PROVIDER_SITE_OTHER): Payer: Self-pay | Admitting: General Surgery

## 2013-06-10 NOTE — Patient Instructions (Addendum)
Follow:   Pre-Op Diet per MD 2 weeks prior to surgery  Phase 2- Liquids (clear/full) 2 weeks after surgery  Vitamin/Mineral/Calcium guidelines for purchasing bariatric supplements  Exercise guidelines pre and post-op per MD  Follow-up at [facility] in 2 weeks post-op for diet advancement. Contact Natalio Salois as needed with questions/concerns. 

## 2013-06-10 NOTE — Progress Notes (Addendum)
Bariatric Class:  Appt start time: 1730   End time: 1830.  Pre-Operative Nutrition Class  Patient was seen on 04/30/13 for additional Pre-Operative Bariatric Surgery [facility] at [facility].   Surgery #1: LAGB Removal (band and port) Surgery date: 05/05/13 Surgery #2: Sleeve Gastrectomy Surgery date: 07/21/13 Surgeon: Biagio Quint Start weight: 287.2 (@ [facility] - 03/19/13)   Weight today: 293.0 lbs Weight change: 0.8 lbs Total weight lost: n/a  TANITA  BODY COMP RESULTS  03/26/13 04/30/13   BMI (kg/m^2) 43.0 43.3   Fat Mass (lbs) 162.0 162.5   Fat Free Mass (lbs) 129.0 130.5   Total Body Water (lbs) 94.5 95.5   Samples given per MNT protocol: Pt received samples during initial pre-op class on 03/26/13. She is attending today for the second time.  The following the learning objective met by the patient during this course:  Identifies Pre-Op Dietary Goals and will begin 2 weeks pre-operatively  Identifies appropriate sources of fluids and proteins   States protein recommendations and appropriate sources pre and post-operatively  Identifies Post-Operative Dietary Goals and will follow for 2 weeks post-operatively  Identifies appropriate multivitamin and calcium sources  Describes the need for physical activity post-operatively and will follow MD recommendations  States when to call healthcare provider regarding medication questions or post-operative complications  Handouts given during class include:  Pre-Op Bariatric Surgery Diet Handout  Protein Shake Handout  Post-Op Bariatric Surgery Nutrition Handout  BELT Program Information Flyer  Support Group Information Flyer  [facility] Bariatric Supplements Price List  Follow-Up Plan: Patient will follow-up at [facility] 2 weeks post operatively for diet advancement per MD.

## 2013-06-16 ENCOUNTER — Telehealth (INDEPENDENT_AMBULATORY_CARE_PROVIDER_SITE_OTHER): Payer: Self-pay

## 2013-06-16 NOTE — Telephone Encounter (Signed)
Called and spoke to patient, Dr. Biagio Quint recommends patient waiting until September for her surgery but, patient has issues with her work in Sept and need's to have the surgery scheduled in August.  Due to work issues Dr. Biagio Quint is okay with patient scheduling her surgery in August.  Patient aware that I will send a message to Sherion (Bariatric Coordinator) and Gwenith Daily (Surgery Scheduler) to proceed with scheduling her surgery.

## 2013-06-16 NOTE — Telephone Encounter (Signed)
Patient calling into office to see if she can schedule her Gastric Sleeve in August vs September.  Per Dr. Delice Lesch last office note, states that he recommends waiting 3 months after having lap band removed.  Patient request that I forward this message to Dr. Biagio Quint for review.  I will call patient back after speaking with Dr. Biagio Quint.

## 2013-06-19 ENCOUNTER — Telehealth (INDEPENDENT_AMBULATORY_CARE_PROVIDER_SITE_OTHER): Payer: Self-pay | Admitting: General Surgery

## 2013-06-19 NOTE — Telephone Encounter (Signed)
Please place EPIC orders for surgery scheduled for 07/21/13  thanks

## 2013-06-24 ENCOUNTER — Telehealth: Payer: Self-pay | Admitting: *Deleted

## 2013-06-24 DIAGNOSIS — R911 Solitary pulmonary nodule: Secondary | ICD-10-CM

## 2013-06-24 NOTE — Telephone Encounter (Signed)
Message copied by Valentino Hue on Wed Jun 24, 2013  4:54 PM ------      Message from: Shan Levans E      Created: Wed Jun 24, 2013  1:45 PM       This pt needs a non contrasted CT Chest scheduled to f/u on lung nodules ------

## 2013-06-24 NOTE — Telephone Encounter (Signed)
Order placed. Pt aware [facility] will be contacting her to set this up. She verbalized understanding. PCCs, pls call pt to schedule. Thank you.

## 2013-06-25 NOTE — Telephone Encounter (Signed)
Ct is sched 06/30/13 10:30 @ [facility] pt is aware. Debra Carpenter

## 2013-06-26 ENCOUNTER — Other Ambulatory Visit (INDEPENDENT_AMBULATORY_CARE_PROVIDER_SITE_OTHER): Payer: Self-pay | Admitting: General Surgery

## 2013-06-30 ENCOUNTER — Other Ambulatory Visit: Payer: BC Managed Care – PPO

## 2013-06-30 ENCOUNTER — Ambulatory Visit (HOSPITAL_BASED_OUTPATIENT_CLINIC_OR_DEPARTMENT_OTHER): Payer: BC Managed Care – PPO

## 2013-07-02 ENCOUNTER — Other Ambulatory Visit (INDEPENDENT_AMBULATORY_CARE_PROVIDER_SITE_OTHER): Payer: Self-pay | Admitting: General Surgery

## 2013-07-07 ENCOUNTER — Encounter (HOSPITAL_COMMUNITY): Payer: Self-pay | Admitting: Pharmacy Technician

## 2013-07-15 ENCOUNTER — Other Ambulatory Visit (HOSPITAL_COMMUNITY): Payer: Self-pay | Admitting: General Surgery

## 2013-07-15 ENCOUNTER — Telehealth: Payer: Self-pay | Admitting: *Deleted

## 2013-07-15 ENCOUNTER — Ambulatory Visit (INDEPENDENT_AMBULATORY_CARE_PROVIDER_SITE_OTHER): Payer: BC Managed Care – PPO | Admitting: General Surgery

## 2013-07-15 ENCOUNTER — Encounter (INDEPENDENT_AMBULATORY_CARE_PROVIDER_SITE_OTHER): Payer: Self-pay | Admitting: General Surgery

## 2013-07-15 DIAGNOSIS — Z6841 Body Mass Index (BMI) 40.0 and over, adult: Secondary | ICD-10-CM

## 2013-07-15 NOTE — Telephone Encounter (Signed)
Pt had f/u CT Chest scheduled for 06/30/13 per phone msg from 06/24/13. CT has not been done yet. Called, spoke with pt to discuss. Pt states she had to cancel CT d/t a family emergency. I offered to reschedule this.   Pt states she is having surgery next week and is requesting to just call our office back to reschedule CT Chest after surgery.

## 2013-07-15 NOTE — Progress Notes (Signed)
Patient ID: Debra Carpenter, female   DOB: 11/06/1966, 47 y.o.   MRN: [id number]  Chief Complaint  Patient presents with  . Bariatric Pre-op    HPI Debra Carpenter is a 47 y.o. female.  This patient comes in today for her preoperative visit in preparation for vertical sleeve gastrectomy. She has a BMI of 44 and had a prior realize band placed in [place]. She never had any significant weight loss and we removed a band. She says that she has not had any reflux since her band has been removed and continues to want sleeve gastrectomy. She continues to exercise. HPI  Past Medical History  Diagnosis Date  . GERD (gastroesophageal reflux disease)   . Hypertension   . Anxiety and depression   . Lung nodule 08/24/2011  . Bony sclerosis 08/22/2011  . Heart murmur   . Anginal pain 9/12    states negative cardiology workup exception of murmur    Past Surgical History  Procedure Laterality Date  . Laparoscopic gastric banding    . Abdominal hysterectomy  2007  . Laparoscopic repair and removal of gastric band N/A 05/05/2013    Procedure: LAPAROSCOPIC REMOVAL OF ADJUSTABLE GASTRIC BAND AND PORT, ENDOSCOPY ;  Surgeon: Nikkolas Coomes, DO;  Location: [facility];  Service: General;  Laterality: N/A;  laparoscopic removal of adjustable gastric band AND PORT     Family History  Problem Relation Age of Onset  . Hypertension Mother   . Hypertension Father   . Cancer Father     ? colon cancer  . Cancer Maternal Grandmother     ovarian cancer  . Hypertension Maternal Grandfather   . Hypertension Paternal Grandfather     Social History History  Substance Use Topics  . Smoking status: Never Smoker   . Smokeless tobacco: Never Used  . Alcohol Use: No    Allergies  Allergen Reactions  . Adhesive [Tape] Hives    Current Outpatient Prescriptions  Medication Sig Dispense Refill  . cetirizine (ZYRTEC) 10 MG tablet Take 10 mg by mouth daily.      . FLUoxetine (PROZAC) 10 MG capsule Take 10 mg by mouth  every morning.      . losartan (COZAAR) 50 MG tablet Take 50 mg by mouth every morning.        No current facility-administered medications for this visit.    Review of Systems Review of Systems All other review of systems negative or noncontributory except as stated in the HPI  Blood pressure 138/80, pulse 64, temperature 97.4 F (36.3 C), temperature source Temporal, resp. rate 15, height 5' 8.5" (1.74 m), weight 291 lb 6.4 oz (132.178 kg).  Physical Exam Physical Exam Physical Exam  Nursing note and vitals reviewed. Constitutional: She is oriented to person, place, and time. She appears well-developed and well-nourished. No distress.  HENT:  Head: Normocephalic and atraumatic.  Mouth/Throat: No oropharyngeal exudate.  Eyes: Conjunctivae and EOM are normal. Pupils are equal, round, and reactive to light. Right eye exhibits no discharge. Left eye exhibits no discharge. No scleral icterus.  Neck: Normal range of motion. Neck supple. No tracheal deviation present.  Cardiovascular: Normal rate, regular rhythm, normal heart sounds and intact distal pulses.   Pulmonary/Chest: Effort normal and breath sounds normal. No stridor. No respiratory distress. She has no wheezes.  Abdominal: Soft. Bowel sounds are normal. She exhibits no distension and no mass. There is no tenderness. There is no rebound and no guarding.  Musculoskeletal: Normal range of motion. She   exhibits no edema and no tenderness.  Neurological: She is alert and oriented to person, place, and time.  Skin: Skin is warm and dry. No rash noted. She is not diaphoretic. No erythema. No pallor.  Psychiatric: She has a normal mood and affect. Her behavior is normal. Judgment and thought content normal.    Data Reviewed   Assessment    Morbid obesity We have again reviewed the surgical options for medical and surgical weight loss including sleeve gastrectomy and gastric bypass. We discussed the pros and cons of each and she  remains interested in sleeve gastrectomy. We discussed the risks of the procedure.  The risks of infection, bleeding, pain, scarring, weight regain, too little or too much weight loss, vitamin deficiencies and need for lifelong vitamin supplementation, hair loss, need for protein supplementation, leaks, stricture, reflux, food intolerance, need for reoperation and conversion to roux Y gastric bypass, need for open surgery, injury to spleen or surrounding structures, DVT's, PE, and death again discussed with the patient and the patient expressed understanding and desires to proceed with laparoscopic vertical sleeve gastrectomy, possible open, intraoperative endoscopy. I again discussed with her the possibility of worsening reflux and the need to convert this to gastric bypass and soaked discussed the much higher rate complications with revisional surgeries including leak. We also discussed the perioperative course and she desires to proceed with a sleeve gastrectomy.      Plan    We will plan for vertical sleeve gastrectomy next week       Mariadelaluz [place] DAVID 07/15/2013, 3:40 PM    

## 2013-07-15 NOTE — Patient Instructions (Addendum)
20 Debra Carpenter  07/15/2013  YOUR SURGERY IS SCHEDULED AT [facility]  ON:   Tuesday   07/21/13  REPORT TO [place] [facility] AT:  5:15 AM      PHONE # FOR SHORT STAY IS [phone]  DO NOT EAT OR DRINK ANYTHING AFTER MIDNIGHT THE NIGHT BEFORE YOUR SURGERY.  YOU MAY BRUSH YOUR TEETH, RINSE OUT YOUR MOUTH--BUT NO WATER, NO FOOD, NO CHEWING GUM, NO MINTS, NO CANDIES, NO CHEWING TOBACCO.  PLEASE TAKE THE FOLLOWING MEDICATIONS THE AM OF YOUR SURGERY WITH A FEW SIPS OF WATER:  ZYRTEC, PROZAC   DO NOT BRING VALUABLES, MONEY, CREDIT CARDS.  DO NOT WEAR JEWELRY, MAKE-UP, NAIL POLISH AND NO METAL PINS OR CLIPS IN YOUR HAIR. CONTACT LENS, DENTURES / PARTIALS, GLASSES SHOULD NOT BE WORN TO SURGERY AND IN MOST CASES-HEARING AIDS WILL NEED TO BE REMOVED.  BRING YOUR GLASSES CASE, ANY EQUIPMENT NEEDED FOR YOUR CONTACT LENS. FOR PATIENTS ADMITTED TO THE HOSPITAL--CHECK OUT TIME THE DAY OF DISCHARGE IS 11:00 AM.  ALL INPATIENT ROOMS ARE PRIVATE - WITH BATHROOM, TELEPHONE, TELEVISION AND WIFI INTERNET.                             PLEASE READ OVER ANY  FACT SHEETS THAT YOU WERE GIVEN: MRSA INFORMATION, BLOOD TRANSFUSION INFORMATION, INCENTIVE SPIROMETER INFORMATION. FAILURE TO FOLLOW THESE INSTRUCTIONS MAY RESULT IN THE CANCELLATION OF YOUR SURGERY.   PATIENT SIGNATURE_________________________________

## 2013-07-16 ENCOUNTER — Encounter (HOSPITAL_COMMUNITY): Payer: Self-pay

## 2013-07-16 ENCOUNTER — Ambulatory Visit (HOSPITAL_COMMUNITY)
Admission: RE | Admit: 2013-07-16 | Discharge: 2013-07-16 | Disposition: A | Discharge status: Home / Self Care | Payer: BC Managed Care – PPO | Source: Ambulatory Visit | Attending: General Surgery | Admitting: General Surgery

## 2013-07-16 ENCOUNTER — Encounter (HOSPITAL_COMMUNITY)
Admission: RE | Admit: 2013-07-16 | Discharge: 2013-07-16 | Disposition: A | Discharge status: Home / Self Care | Payer: BC Managed Care – PPO | Source: Ambulatory Visit | Attending: General Surgery | Admitting: General Surgery

## 2013-07-16 DIAGNOSIS — Z01812 Encounter for preprocedural laboratory examination: Secondary | ICD-10-CM | POA: Insufficient documentation

## 2013-07-16 DIAGNOSIS — Z01818 Encounter for other preprocedural examination: Secondary | ICD-10-CM | POA: Insufficient documentation

## 2013-07-16 LAB — COMPREHENSIVE METABOLIC PANEL
BUN: 9 mg/dL (ref 6–23)
CO2: 29 mEq/L (ref 19–32)
Calcium: 9.4 mg/dL (ref 8.4–10.5)
Creatinine, Ser: 0.58 mg/dL (ref 0.50–1.10)
GFR calc Af Amer: >90 mL/min (ref >90)
GFR calc non Af Amer: >90 mL/min (ref >90)
Glucose, Bld: 87 mg/dL (ref 70–99)

## 2013-07-16 LAB — CBC WITH DIFFERENTIAL/PLATELET
Eosinophils Relative: 2 % (ref 0–5)
HCT: 40.5 % (ref 36.0–46.0)
Lymphocytes Relative: 22 % (ref 12–46)
Lymphs Abs: 1.3 10*3/uL (ref 0.7–4.0)
MCV: 85.3 fL (ref 78.0–100.0)
Monocytes Absolute: 0.9 10*3/uL (ref 0.1–1.0)
Monocytes Relative: 16 % — ABNORMAL HIGH (ref 3–12)
RDW: 13 % (ref 11.5–15.5)
WBC: 5.8 10*3/uL (ref 4.0–10.5)

## 2013-07-16 NOTE — Pre-Procedure Instructions (Signed)
EKG REPORT IN EPIC FROM 01/23/13.  CXR WAS DONE TODAY - PREOP - AT [facility].

## 2013-07-20 ENCOUNTER — Telehealth (INDEPENDENT_AMBULATORY_CARE_PROVIDER_SITE_OTHER): Payer: Self-pay

## 2013-07-20 NOTE — Telephone Encounter (Signed)
Patient calling into office with questions regarding her upcoming surgery.  Patient wanted to know if she's scheduled for Open or Laparoscopic Sleeve Gastrectomy.  Patient was concerned and would like to speak with Dr. Biagio Quint directly.  Message sent to Dr. Biagio Quint to discuss in further detail.

## 2013-07-20 NOTE — Anesthesia Preprocedure Evaluation (Addendum)
Anesthesia Evaluation  Patient identified by MRN, date of birth, ID band Patient awake    Reviewed: Allergy & Precautions, H&P , NPO status , Patient's Chart, lab work & pertinent test results  Airway Mallampati: II TM Distance: >3 FB Neck ROM: full    Dental no notable dental hx. (+) Teeth Intact and Dental Advisory Given   Pulmonary neg pulmonary ROS,  breath sounds clear to auscultation  Pulmonary exam normal       Cardiovascular Exercise Tolerance: Good hypertension, Pt. on medications negative cardio ROS  Rhythm:regular Rate:Normal  Has chest pain but negative cardiac w/u   Neuro/Psych negative neurological ROS  negative psych ROS   GI/Hepatic negative GI ROS, Neg liver ROS,   Endo/Other  negative endocrine ROSMorbid obesity  Renal/GU negative Renal ROS  negative genitourinary   Musculoskeletal   Abdominal (+) + obese,   Peds  Hematology negative hematology ROS (+)   Anesthesia Other Findings   Reproductive/Obstetrics negative OB ROS                         Anesthesia Physical Anesthesia Plan  ASA: III  Anesthesia Plan: General   Post-op Pain Management:    Induction: Intravenous  Airway Management Planned: Oral ETT  Additional Equipment:   Intra-op Plan:   Post-operative Plan: Extubation in OR  Informed Consent: I have reviewed the patients History and Physical, chart, labs and discussed the procedure including the risks, benefits and alternatives for the proposed anesthesia with the patient or authorized representative who has indicated his/her understanding and acceptance.   Dental Advisory Given  Plan Discussed with: CRNA and Surgeon  Anesthesia Plan Comments:        Anesthesia Quick Evaluation

## 2013-07-21 ENCOUNTER — Encounter (HOSPITAL_COMMUNITY): Payer: Self-pay | Admitting: Anesthesiology

## 2013-07-21 ENCOUNTER — Inpatient Hospital Stay (HOSPITAL_COMMUNITY): Payer: BC Managed Care – PPO | Admitting: Anesthesiology

## 2013-07-21 ENCOUNTER — Inpatient Hospital Stay (HOSPITAL_COMMUNITY)
Admission: RE | Admit: 2013-07-21 | Discharge: 2013-07-23 | DRG: 293 | Disposition: A | Discharge status: Home / Self Care | Payer: BC Managed Care – PPO | Source: Ambulatory Visit | Attending: General Surgery | Admitting: General Surgery

## 2013-07-21 ENCOUNTER — Encounter (HOSPITAL_COMMUNITY)
Admission: RE | Disposition: A | Discharge status: Home / Self Care | Payer: Self-pay | Source: Ambulatory Visit | Attending: General Surgery

## 2013-07-21 ENCOUNTER — Encounter (HOSPITAL_COMMUNITY): Payer: Self-pay | Admitting: *Deleted

## 2013-07-21 DIAGNOSIS — Z6841 Body Mass Index (BMI) 40.0 and over, adult: Secondary | ICD-10-CM

## 2013-07-21 DIAGNOSIS — I10 Essential (primary) hypertension: Secondary | ICD-10-CM

## 2013-07-21 DIAGNOSIS — K660 Peritoneal adhesions (postprocedural) (postinfection): Secondary | ICD-10-CM | POA: Diagnosis present

## 2013-07-21 HISTORY — PX: UPPER GI ENDOSCOPY: SHX6162

## 2013-07-21 HISTORY — PX: LAPAROSCOPIC GASTRIC SLEEVE RESECTION: SHX5895

## 2013-07-21 HISTORY — PX: LAPAROSCOPIC LYSIS OF ADHESIONS: SHX5905

## 2013-07-21 SURGERY — GASTRECTOMY, SLEEVE, LAPAROSCOPIC
Anesthesia: General | Site: Esophagus | Wound class: Clean Contaminated

## 2013-07-21 MED ORDER — UNJURY CHOCOLATE CLASSIC POWDER
2.0000 [oz_av] | Freq: Four times a day (QID) | ORAL | Status: DC
  Administered 2013-07-23 (×2): 2 [oz_av] via ORAL

## 2013-07-21 MED ORDER — CISATRACURIUM BESYLATE (PF) 10 MG/5ML IV SOLN
INTRAVENOUS | Status: DC | PRN
  Administered 2013-07-21: 2 mg via INTRAVENOUS
  Administered 2013-07-21: 6 mg via INTRAVENOUS
  Administered 2013-07-21: 4 mg via INTRAVENOUS
  Administered 2013-07-21: 8 mg via INTRAVENOUS
  Administered 2013-07-21: 4 mg via INTRAVENOUS

## 2013-07-21 MED ORDER — SODIUM CHLORIDE 0.9 % IV SOLN
1.0000 g | Freq: Once | INTRAVENOUS | Status: AC
  Administered 2013-07-21: 1 g via INTRAVENOUS

## 2013-07-21 MED ORDER — LOSARTAN POTASSIUM 50 MG PO TABS
50.0000 mg | ORAL_TABLET | Freq: Every morning | ORAL | Status: DC
  Administered 2013-07-22 – 2013-07-23 (×2): 50 mg via ORAL
  Filled 2013-07-21 (×2): qty 1

## 2013-07-21 MED ORDER — UNJURY VANILLA POWDER: 2.0000 [oz_av] | Freq: Four times a day (QID) | ORAL | Status: DC

## 2013-07-21 MED ORDER — MORPHINE SULFATE 2 MG/ML IJ SOLN
2.0000 mg | INTRAMUSCULAR | Status: DC | PRN
  Administered 2013-07-21: 2 mg via INTRAVENOUS
  Administered 2013-07-21: 4 mg via INTRAVENOUS
  Administered 2013-07-22 (×3): 2 mg via INTRAVENOUS
  Filled 2013-07-21 (×2): qty 1
  Filled 2013-07-21: qty 2
  Filled 2013-07-21 (×2): qty 1

## 2013-07-21 MED ORDER — PROPOFOL 10 MG/ML IV BOLUS
INTRAVENOUS | Status: DC | PRN
  Administered 2013-07-21: 200 mg via INTRAVENOUS

## 2013-07-21 MED ORDER — UNJURY CHICKEN SOUP POWDER: 2.0000 [oz_av] | Freq: Four times a day (QID) | ORAL | Status: DC

## 2013-07-21 MED ORDER — PROMETHAZINE HCL 25 MG/ML IJ SOLN
6.2500 mg | Freq: Four times a day (QID) | INTRAMUSCULAR | Status: DC | PRN
  Administered 2013-07-21: 6.25 mg via INTRAVENOUS

## 2013-07-21 MED ORDER — FENTANYL CITRATE 0.05 MG/ML IJ SOLN
INTRAMUSCULAR | Status: DC | PRN
  Administered 2013-07-21: 50 ug via INTRAVENOUS
  Administered 2013-07-21: 100 ug via INTRAVENOUS
  Administered 2013-07-21: 50 ug via INTRAVENOUS

## 2013-07-21 MED ORDER — LACTATED RINGERS IV SOLN
INTRAVENOUS | Status: DC | PRN
  Administered 2013-07-21 (×3): via INTRAVENOUS

## 2013-07-21 MED ORDER — GLYCOPYRROLATE 0.2 MG/ML IJ SOLN
INTRAMUSCULAR | Status: DC | PRN
  Administered 2013-07-21: .8 mg via INTRAVENOUS

## 2013-07-21 MED ORDER — HYDROMORPHONE HCL PF 1 MG/ML IJ SOLN
0.2500 mg | INTRAMUSCULAR | Status: DC | PRN
Start: 2013-07-21 — End: 2013-07-21
  Administered 2013-07-21 (×2): 0.5 mg via INTRAVENOUS

## 2013-07-21 MED ORDER — LACTATED RINGERS IR SOLN
Status: DC | PRN
  Administered 2013-07-21: 2000 mL

## 2013-07-21 MED ORDER — PANTOPRAZOLE SODIUM 40 MG IV SOLR
40.0000 mg | INTRAVENOUS | Status: DC
  Administered 2013-07-21 – 2013-07-22 (×2): 40 mg via INTRAVENOUS
  Filled 2013-07-21 (×3): qty 40

## 2013-07-21 MED ORDER — MIDAZOLAM HCL 5 MG/5ML IJ SOLN
INTRAMUSCULAR | Status: DC | PRN
  Administered 2013-07-21: 2 mg via INTRAVENOUS

## 2013-07-21 MED ORDER — HEPARIN SODIUM (PORCINE) 5000 UNIT/ML IJ SOLN
5000.0000 [IU] | Freq: Once | INTRAMUSCULAR | Status: AC
  Administered 2013-07-21: 5000 [IU] via SUBCUTANEOUS
  Filled 2013-07-21: qty 1

## 2013-07-21 MED ORDER — KCL IN DEXTROSE-NACL 20-5-0.45 MEQ/L-%-% IV SOLN
INTRAVENOUS | Status: DC
  Administered 2013-07-21 – 2013-07-22 (×3): via INTRAVENOUS
  Filled 2013-07-21 (×4): qty 1000

## 2013-07-21 MED ORDER — ONDANSETRON HCL 4 MG/2ML IJ SOLN
INTRAMUSCULAR | Status: DC | PRN
  Administered 2013-07-21: 4 mg via INTRAVENOUS

## 2013-07-21 MED ORDER — NEOSTIGMINE METHYLSULFATE 1 MG/ML IJ SOLN
INTRAMUSCULAR | Status: DC | PRN
  Administered 2013-07-21: 5 mg via INTRAVENOUS

## 2013-07-21 MED ORDER — OXYCODONE HCL 5 MG/5ML PO SOLN
7.5000 mg | ORAL | Status: DC | PRN
  Administered 2013-07-23 (×2): 7.5 mg via ORAL
  Filled 2013-07-21 (×2): qty 10

## 2013-07-21 MED ORDER — DEXAMETHASONE SODIUM PHOSPHATE 10 MG/ML IJ SOLN
INTRAMUSCULAR | Status: DC | PRN
  Administered 2013-07-21: 10 mg via INTRAVENOUS

## 2013-07-21 MED ORDER — ENOXAPARIN SODIUM 40 MG/0.4ML ~~LOC~~ SOLN
40.0000 mg | Freq: Two times a day (BID) | SUBCUTANEOUS | Status: DC
  Administered 2013-07-22 – 2013-07-23 (×3): 40 mg via SUBCUTANEOUS
  Filled 2013-07-21 (×5): qty 0.4

## 2013-07-21 MED ORDER — SUCCINYLCHOLINE CHLORIDE 20 MG/ML IJ SOLN
INTRAMUSCULAR | Status: DC | PRN
  Administered 2013-07-21: 100 mg via INTRAVENOUS

## 2013-07-21 MED ORDER — BUPIVACAINE HCL 0.25 % IJ SOLN
INTRAMUSCULAR | Status: DC | PRN
  Administered 2013-07-21: 30 mL

## 2013-07-21 MED ORDER — PROMETHAZINE HCL 25 MG/ML IJ SOLN: 12.5000 mg | INTRAMUSCULAR | Status: DC | PRN

## 2013-07-21 MED ORDER — ONDANSETRON HCL 4 MG/2ML IJ SOLN
4.0000 mg | INTRAMUSCULAR | Status: DC | PRN
  Administered 2013-07-21 (×2): 4 mg via INTRAVENOUS
  Filled 2013-07-21 (×2): qty 2

## 2013-07-21 MED ORDER — LACTATED RINGERS IV SOLN: INTRAVENOUS | Status: DC

## 2013-07-21 MED ORDER — LIDOCAINE-EPINEPHRINE 1 %-1:100000 IJ SOLN
INTRAMUSCULAR | Status: DC | PRN
  Administered 2013-07-21: 30 mL

## 2013-07-21 MED ORDER — LABETALOL HCL 5 MG/ML IV SOLN
10.0000 mg | INTRAVENOUS | Status: DC | PRN
  Administered 2013-07-21 – 2013-07-22 (×3): 10 mg via INTRAVENOUS
  Filled 2013-07-21 (×2): qty 4

## 2013-07-21 SURGICAL SUPPLY — 51 items
APPLICATOR COTTON TIP 6IN STRL (MISCELLANEOUS) IMPLANT
APPLIER CLIP ROT 10 11.4 M/L (STAPLE)
CABLE HIGH FREQUENCY MONO STRZ (ELECTRODE) ×4 IMPLANT
CANISTER SUCTION 2500CC (MISCELLANEOUS) ×8 IMPLANT
CHLORAPREP W/TINT 26ML (MISCELLANEOUS) ×8 IMPLANT
CLIP APPLIE ROT 10 11.4 M/L (STAPLE) IMPLANT
CLOTH BEACON ORANGE TIMEOUT ST (SAFETY) ×4 IMPLANT
DERMABOND ADVANCED (GAUZE/BANDAGES/DRESSINGS) ×1
DERMABOND ADVANCED .7 DNX12 (GAUZE/BANDAGES/DRESSINGS) ×3 IMPLANT
DEVICE SUTURE ENDOST 10MM (ENDOMECHANICALS) IMPLANT
DRAIN CHANNEL 19F RND (DRAIN) ×4 IMPLANT
DRAPE LAPAROSCOPIC ABDOMINAL (DRAPES) ×4 IMPLANT
DRAPE UTILITY XL STRL (DRAPES) ×8 IMPLANT
ELECT REM PT RETURN 9FT ADLT (ELECTROSURGICAL) ×4
ELECTRODE REM PT RTRN 9FT ADLT (ELECTROSURGICAL) ×3 IMPLANT
EVACUATOR SILICONE 100CC (DRAIN) ×4 IMPLANT
GLOVE SURG SS PI 7.5 STRL IVOR (GLOVE) ×8 IMPLANT
GOWN STRL NON-REIN LRG LVL3 (GOWN DISPOSABLE) IMPLANT
GOWN STRL REIN XL XLG (GOWN DISPOSABLE) ×20 IMPLANT
HANDLE STAPLE EGIA 4 XL (STAPLE) ×4 IMPLANT
HOVERMATT SINGLE USE (MISCELLANEOUS) ×4 IMPLANT
KIT BASIN OR (CUSTOM PROCEDURE TRAY) ×4 IMPLANT
MARKER SKIN DUAL TIP RULER LAB (MISCELLANEOUS) ×4 IMPLANT
NEEDLE SPNL 22GX3.5 QUINCKE BK (NEEDLE) ×4 IMPLANT
NS IRRIG 1000ML POUR BTL (IV SOLUTION) ×4 IMPLANT
PENCIL BUTTON HOLSTER BLD 10FT (ELECTRODE) ×4 IMPLANT
POUCH SPECIMEN RETRIEVAL 10MM (ENDOMECHANICALS) IMPLANT
RELOAD EGIA 60 MED/THCK PURPLE (STAPLE) ×16 IMPLANT
RELOAD TRI 2.0 60 XTHK VAS SUL (STAPLE) ×8 IMPLANT
SCISSORS LAP 5X35 DISP (ENDOMECHANICALS) ×4 IMPLANT
SEALANT SURGICAL APPL DUAL CAN (MISCELLANEOUS) ×4 IMPLANT
SET IRRIG TUBING LAPAROSCOPIC (IRRIGATION / IRRIGATOR) ×4 IMPLANT
SHEARS CURVED HARMONIC AC 45CM (MISCELLANEOUS) ×4 IMPLANT
SLEEVE SURGEON STRL (DRAPES) ×4 IMPLANT
SLEEVE XCEL OPT CAN 5 100 (ENDOMECHANICALS) ×8 IMPLANT
SOLUTION ANTI FOG 6CC (MISCELLANEOUS) ×4 IMPLANT
SOLUTION ELECTROLUBE (MISCELLANEOUS) ×4 IMPLANT
SPONGE GAUZE 4X4 12PLY (GAUZE/BANDAGES/DRESSINGS) IMPLANT
SPONGE LAP 18X18 X RAY DECT (DISPOSABLE) ×4 IMPLANT
SUT ETHILON 2 0 PS N (SUTURE) ×4 IMPLANT
SUT MNCRL AB 4-0 PS2 18 (SUTURE) ×4 IMPLANT
SUT VICRYL 0 UR6 27IN ABS (SUTURE) IMPLANT
SYR 50ML LL SCALE MARK (SYRINGE) ×4 IMPLANT
TRAY FOLEY CATH 14FRSI W/METER (CATHETERS) ×4 IMPLANT
TRAY LAP CHOLE (CUSTOM PROCEDURE TRAY) ×4 IMPLANT
TROCAR BLADELESS 15MM (ENDOMECHANICALS) ×4 IMPLANT
TROCAR BLADELESS OPT 5 100 (ENDOMECHANICALS) ×4 IMPLANT
TROCAR XCEL 12X100 BLDLESS (ENDOMECHANICALS) ×4 IMPLANT
TUBING CONNECTING 10 (TUBING) ×4 IMPLANT
TUBING ENDO SMARTCAP (MISCELLANEOUS) ×4 IMPLANT
TUBING FILTER THERMOFLATOR (ELECTROSURGICAL) ×4 IMPLANT

## 2013-07-21 NOTE — Anesthesia Postprocedure Evaluation (Signed)
  Anesthesia Post-op Note  Patient: Debra Carpenter  Procedure(s) Performed: Procedure(s) (LRB): LAPAROSCOPIC GASTRIC SLEEVE RESECTION, LYSIS OF ADHESIONS, EGD (N/A) UPPER GI ENDOSCOPY LAPAROSCOPIC LYSIS OF ADHESIONS  Patient Location: PACU  Anesthesia Type: General  Level of Consciousness: awake and alert   Airway and Oxygen Therapy: Patient Spontanous Breathing  Post-op Pain: mild  Post-op Assessment: Post-op Vital signs reviewed, Patient's Cardiovascular Status Stable, Respiratory Function Stable, Patent Airway and No signs of Nausea or vomiting  Last Vitals:  Filed Vitals:   07/21/13 1102  BP: 153/96  Pulse: 74  Temp: 36.9 C  Resp: 12    Post-op Vital Signs: stable   Complications: No apparent anesthesia complications

## 2013-07-21 NOTE — H&P (View-Only) (Signed)
Patient ID: Debra Carpenter, female   DOB: 01/14/1966, 47 y.o.   MRN: [id number]  Chief Complaint  Patient presents with  . Bariatric Pre-op    HPI Debra Carpenter is a 47 y.o. female.  This patient comes in today for her preoperative visit in preparation for vertical sleeve gastrectomy. She has a BMI of 44 and had a prior realize band placed in [place]. She never had any significant weight loss and we removed a band. She says that she has not had any reflux since her band has been removed and continues to want sleeve gastrectomy. She continues to exercise. HPI  Past Medical History  Diagnosis Date  . GERD (gastroesophageal reflux disease)   . Hypertension   . Anxiety and depression   . Lung nodule 08/24/2011  . Bony sclerosis 08/22/2011  . Heart murmur   . Anginal pain 9/12    states negative cardiology workup exception of murmur    Past Surgical History  Procedure Laterality Date  . Laparoscopic gastric banding    . Abdominal hysterectomy  2007  . Laparoscopic repair and removal of gastric band N/A 05/05/2013    Procedure: LAPAROSCOPIC REMOVAL OF ADJUSTABLE GASTRIC BAND AND PORT, ENDOSCOPY ;  Surgeon: Debra Pilot, DO;  Location: [facility];  Service: General;  Laterality: N/A;  laparoscopic removal of adjustable gastric band AND PORT     Family History  Problem Relation Age of Onset  . Hypertension Mother   . Hypertension Father   . Cancer Father     ? colon cancer  . Cancer Maternal Grandmother     ovarian cancer  . Hypertension Maternal Grandfather   . Hypertension Paternal Grandfather     Social History History  Substance Use Topics  . Smoking status: Never Smoker   . Smokeless tobacco: Never Used  . Alcohol Use: No    Allergies  Allergen Reactions  . Adhesive [Tape] Hives    Current Outpatient Prescriptions  Medication Sig Dispense Refill  . cetirizine (ZYRTEC) 10 MG tablet Take 10 mg by mouth daily.      Marland Kitchen FLUoxetine (PROZAC) 10 MG capsule Take 10 mg by mouth  every morning.      Marland Kitchen losartan (COZAAR) 50 MG tablet Take 50 mg by mouth every morning.        No current facility-administered medications for this visit.    Review of Systems Review of Systems All other review of systems negative or noncontributory except as stated in the HPI  Blood pressure 138/80, pulse 64, temperature 97.4 F (36.3 C), temperature source Temporal, resp. rate 15, height 5' 8.5" (1.74 m), weight 291 lb 6.4 oz (132.178 kg).  Physical Exam Physical Exam Physical Exam  Nursing note and vitals reviewed. Constitutional: She is oriented to person, place, and time. She appears well-developed and well-nourished. No distress.  HENT:  Head: Normocephalic and atraumatic.  Mouth/Throat: No oropharyngeal exudate.  Eyes: Conjunctivae and EOM are normal. Pupils are equal, round, and reactive to light. Right eye exhibits no discharge. Left eye exhibits no discharge. No scleral icterus.  Neck: Normal range of motion. Neck supple. No tracheal deviation present.  Cardiovascular: Normal rate, regular rhythm, normal heart sounds and intact distal pulses.   Pulmonary/Chest: Effort normal and breath sounds normal. No stridor. No respiratory distress. She has no wheezes.  Abdominal: Soft. Bowel sounds are normal. She exhibits no distension and no mass. There is no tenderness. There is no rebound and no guarding.  Musculoskeletal: Normal range of motion. She  exhibits no edema and no tenderness.  Neurological: She is alert and oriented to person, place, and time.  Skin: Skin is warm and dry. No rash noted. She is not diaphoretic. No erythema. No pallor.  Psychiatric: She has a normal mood and affect. Her behavior is normal. Judgment and thought content normal.    Data Reviewed   Assessment    Morbid obesity We have again reviewed the surgical options for medical and surgical weight loss including sleeve gastrectomy and gastric bypass. We discussed the pros and cons of each and she  remains interested in sleeve gastrectomy. We discussed the risks of the procedure.  The risks of infection, bleeding, pain, scarring, weight regain, too little or too much weight loss, vitamin deficiencies and need for lifelong vitamin supplementation, hair loss, need for protein supplementation, leaks, stricture, reflux, food intolerance, need for reoperation and conversion to roux Y gastric bypass, need for open surgery, injury to spleen or surrounding structures, DVT's, PE, and death again discussed with the patient and the patient expressed understanding and desires to proceed with laparoscopic vertical sleeve gastrectomy, possible open, intraoperative endoscopy. I again discussed with her the possibility of worsening reflux and the need to convert this to gastric bypass and soaked discussed the much higher rate complications with revisional surgeries including leak. We also discussed the perioperative course and she desires to proceed with a sleeve gastrectomy.      Plan    We will plan for vertical sleeve gastrectomy next week       Debra Carpenter DAVID 07/15/2013, 3:40 PM

## 2013-07-21 NOTE — Preoperative (Signed)
Beta Blockers   Reason not to administer Beta Blockers:Not Applicable 

## 2013-07-21 NOTE — Interval H&P Note (Signed)
History and Physical Interval Note:  07/21/2013 7:13 AM  Debra Carpenter  has presented today for surgery, with the diagnosis of MORBID OBESITY   The various methods of treatment have been discussed with the patient and family. After consideration of risks, benefits and other options for treatment, the patient has consented to  Procedure(s): LAPAROSCOPIC GASTRIC SLEEVE RESECTION egd (N/A) as a surgical intervention .  The patient's history has been reviewed, patient examined, no change in status, stable for surgery.  I have reviewed the patient's chart and labs.  Questions were answered to the patient's satisfaction.  I have seen and evaluated this patient.  Risks of the procedure again discussed with her.  The risks of infection, bleeding, pain, scarring, weight regain, too little or too much weight loss, vitamin deficiencies and need for lifelong vitamin supplementation, hair loss, need for protein supplementation, leaks, stricture, reflux, food intolerance, need for reoperation and conversion to roux Y gastric bypass, need for open surgery, injury to spleen or surrounding structures, DVT's, PE, and death again discussed with the patient and the patient expressed understanding and desires to proceed with laparoscopic vertical sleeve gastrectomy, possible open, intraoperative endoscopy.  I again discussed with her the increased risks of the procedure since this is a revisional procedure and the risks of reflux which could require conversion to RYGB.  I also explained the higher risk for open procedure since this is a revisional operation.    Lodema Pilot DAVID

## 2013-07-21 NOTE — Transfer of Care (Signed)
Immediate Anesthesia Transfer of Care Note  Patient: Debra Carpenter  Procedure(s) Performed: Procedure(s): LAPAROSCOPIC GASTRIC SLEEVE RESECTION, LYSIS OF ADHESIONS, EGD (N/A) UPPER GI ENDOSCOPY LAPAROSCOPIC LYSIS OF ADHESIONS  Patient Location: PACU  Anesthesia Type:General  Level of Consciousness: awake, sedated and patient cooperative  Airway & Oxygen Therapy: Patient Spontanous Breathing and Patient connected to face mask oxygen  Post-op Assessment: Report given to PACU RN and Post -op Vital signs reviewed and stable  Post vital signs: Reviewed and stable  Complications: No apparent anesthesia complications

## 2013-07-22 ENCOUNTER — Inpatient Hospital Stay (HOSPITAL_COMMUNITY): Payer: BC Managed Care – PPO

## 2013-07-22 ENCOUNTER — Encounter (HOSPITAL_COMMUNITY): Payer: Self-pay | Admitting: General Surgery

## 2013-07-22 LAB — CBC WITH DIFFERENTIAL/PLATELET
Basophils Relative: 0 % (ref 0–1)
Hemoglobin: 13.2 g/dL (ref 12.0–15.0)
Lymphs Abs: 1.2 10*3/uL (ref 0.7–4.0)
MCHC: 34.5 g/dL (ref 30.0–36.0)
Monocytes Relative: 13 % — ABNORMAL HIGH (ref 3–12)
Neutro Abs: 7 10*3/uL (ref 1.7–7.7)
Neutrophils Relative %: 75 % (ref 43–77)
Platelets: 305 10*3/uL (ref 150–400)
RBC: 4.55 MIL/uL (ref 3.87–5.11)

## 2013-07-22 LAB — COMPREHENSIVE METABOLIC PANEL
ALT: 68 U/L — ABNORMAL HIGH (ref 0–35)
Albumin: 3.2 g/dL — ABNORMAL LOW (ref 3.5–5.2)
Alkaline Phosphatase: 98 U/L (ref 39–117)
BUN: 4 mg/dL — ABNORMAL LOW (ref 6–23)
Chloride: 98 mEq/L (ref 96–112)
Potassium: 4.1 mEq/L (ref 3.5–5.1)
Sodium: 133 mEq/L — ABNORMAL LOW (ref 135–145)
Total Bilirubin: 0.2 mg/dL — ABNORMAL LOW (ref 0.3–1.2)

## 2013-07-22 MED ORDER — KETOROLAC TROMETHAMINE 30 MG/ML IJ SOLN
30.0000 mg | Freq: Four times a day (QID) | INTRAMUSCULAR | Status: DC | PRN
  Administered 2013-07-22 – 2013-07-23 (×4): 30 mg via INTRAVENOUS
  Filled 2013-07-22 (×4): qty 1

## 2013-07-22 MED ORDER — SODIUM CHLORIDE 0.9 % IV SOLN
INTRAVENOUS | Status: DC
  Administered 2013-07-22: 125 mL via INTRAVENOUS
  Administered 2013-07-22: 08:00:00 via INTRAVENOUS
  Administered 2013-07-23: 125 mL via INTRAVENOUS

## 2013-07-22 MED ORDER — IOHEXOL 300 MG/ML  SOLN
50.0000 mL | Freq: Once | INTRAMUSCULAR | Status: AC | PRN
  Administered 2013-07-22: 20 mL via ORAL

## 2013-07-22 NOTE — Op Note (Signed)
Debra Carpenter, Debra Carpenter NO.:  [id number]  MEDICAL RECORD NO.:  [id number]  LOCATION:  1531                         FACILITY:  [facility]  PHYSICIAN:  Lodema Pilot, MD       DATE OF BIRTH:  Dec 10, 1965  DATE OF PROCEDURE:  07/21/2013 DATE OF DISCHARGE:                              OPERATIVE REPORT   PREOP DIAGNOSIS:  Obesity.  POSTOP DIAGNOSIS:  Obesity.  PROCEDURE:  Laparoscopic lysis of adhesions with conversion of lap band to vertical sleeve gastrectomy with intraoperative upper endoscopy.  SURGEON:  Lodema Pilot, MD  ASSISTANT:  Dr. Daphine Deutscher.  ANESTHESIA:  General endotracheal tube anesthesia  with 40 mL of 1% lidocaine with epinephrine and 0.25% Marcaine in a 50:50 mixture.  FLUIDS:  20 mL of crystalloid.  ESTIMATED BLOOD LOSS:  Minimal.  DRAINS:  A 19-French Blake drain placed along the staple line.  SPECIMENS:  Greater curvature of the stomach sent to Pathology for permanent sectioning.  COMPLICATIONS:  None apparent.  FINDINGS:  Adhesions of the stomach to the liver and diaphragm with creation of vertical sleeve gastrectomy over a 38-French bougie. Intraoperatively test was negative.  INDICATION FOR PROCEDURE:  Debra Carpenter is a 47 year old female, who had a previous Realize band placed in [facility], and had never had any significant weight loss with attempted management of her adjustable gastric band.  We removed this about 2 months ago, and as part of a planned 2 stage procedure converting this to a sleeve gastrectomy.  OPERATIVE DETAILS:  Debra Carpenter was seen and evaluated in the preoperative area, and risks and benefits of procedure were discussed in lay terms.  Informed consent was obtained.  She was given subcutaneous heparin and prophylactic antibiotics, and taken to the operating room, placed on table in supine position.  General endotracheal tube anesthesia was obtained.  Her abdomen was prepped and draped in the standard surgical fashion  and Foley catheter was placed.  Procedure time- out was performed with all operative team members to confirm proper patient and procedure, and 5-mm Optiview trocar was used to access the peritoneum in the left upper quadrant.  Pneumoperitoneum was obtained. Laparoscope was introduced and there was no evidence of bleeding or bowel injury upon entry.  I placed two 5-mm right upper quadrant ports at this site of previous port placement, and she had some adhesions of the omentum to the abdominal wall, where her previous port tubing had entered the abdominal wall.  This was taken down with sharp dissection. We placed a 12-mm port under this area, the previous camera port where the band was extracted and a Nathanson liver retractor was passed through the epigastric to retract the left lobe of the liver.  She had a large left lobe with a floppy left lateral segment.  Liver retractor was passed and she was noted to have significant amount of adhesion from her stomach where the band was wrapped around the upper part of the stomach to the undersurface of the liver with some blunt dissection, and both the sharp dissection, I took down the adhesions from the liver capsule and which was detached actually fairly tedious dissection, even though that I felt  that I had unfolded all of the previous fundoplication from her band, she had decent amount of adhesions to the undersurface of the liver.  She was able to free the liver from the stomach and identified and the right and left crus of the diaphragm, and after I felt as though I had separated the stomach from the diaphragm and the liver, and that the fundoplication was completely unwrapped.  I decided to start mobilizing the greater curvature of the stomach in preparation for sleeve.  The stomach appeared healthy enough and there was no obvious injury to the stomach, taking down the fundoplication or during the adhesiolysis, and primary emphasis during  the adhesiolysis was on preserving the blood flow of the lesser curve of the stomach, identified the pylorus and measured out 5 cm from the pylorus, and began to divide the short gastric vessels along the greater curve of the stomach.  This was carried up around toward the spleen and immobilized it.  Stomach os at the left off the left crus of the diaphragm.  After I felt that the stomach was completely immobilized and again at the fundoplication had been completely taken down, I started all the tubes were removed the stomach, I started to create my sleeve gastrectomy.  The first again measured 5 cm from the pylorus and the first firing was the antrum was taken with a 16-mm Endo-GIA black Tri-Staple load stapler, taking care to avoid narrowing the stomach at the angularis and incisura.  I then passed the 38-French bougie through the esophagus and along the lesser curvature of the stomach, and originally we had the difficulty passing the bougie on the first attempt, but with a little bit of directions we were able to pass the bougie along the lesser curvature and into the antrum toward the pyloric region.  The second black Tri-Staple load was placed on the stomach, trying to make a smooth transition around the angularis and the second staple load was fired.  I then transitioned the purple Tri-Staple load with the remainder of the staple firings.  With each placement of the stapler I inspected anteriorly and posteriorly, and inspected the bougie to make sure that we were not clamping too tightly to the bougie and to avoid Christmas tree formation of the staple lines.  With the final firing of the stapler, I inspected near the angle of His to make sure that I was not incorporating any esophagus with division, and the stomach was completely transected, staples all appeared well-formed, and the staple line was hemostatic.  At this time, Dr. Daphine Deutscher performed upper endoscopy,  passing the  endoscope through the esophagus and to end of the stomach, and he advanced this endoscope without difficulty into the antrum and to the pyloric region.  She appeared to have some bile reflux, but there was no evidence of internal bleeders and the scope was easily driven around the angularis and to the pylorus as I insufflated just with air, I had the stomach and the sleeve submerge under water and there was no evidence of air leakage.  Again, the sleeve appeared to be nice and tubular without any evidence of stricturing.  The air was suctioned and the scope was removed, and Tisseel fibrin glue was placed along the sleeve staple line, and I enlarged the 15-mm port site and removed the resected portion of the stomach and sent this to Pathology for permanent section.  I suctioned the remainder of the fluid from the abdomen and placed a  19-French Blake drains posterior to sleeve staple lines and exited this through the left upper quadrant trocar site.  This was sutured in place with a nylon drain stitch.  The omentum was placed over the stomach and the drain, and the liver retractor was removed.  The liver appeared hemostatic.  I then approximated the right upper quadrant fascia at the extraction site with interrupted 0 Vicryl sutures in an open fashion.  The sutures were secured, and the abdomen was re-insufflated with carbon dioxide gas. The abdominal wall closure was noted be adequate without any evidence of bleeding or bowel injury.  Final trocars removed and the wounds were irrigated with sterile saline solution and they were injected with total of 40 mL of 1% lidocaine with epinephrine and 0.25% Marcaine in a 50:50 mixture.  Skin edges were approximated with 4-0 Monocryl subcuticular suture.  Skin was washed and dried, and Dermabond was applied.  All sponge, needle, and instrument counts were correct at the end of the case.  The patient tolerated the procedure well without apparent  complication.  Foley catheter was removed, and she was hemodynamically stable and ready for transfer to the recovery room in stable condition.          ______________________________ Lodema Pilot, MD     BL/MEDQ  D:  07/21/2013  T:  07/22/2013  Job:  [id number]

## 2013-07-22 NOTE — Op Note (Signed)
NAMESTANISLAWA, GAFFIN NO.:  [id number]  MEDICAL RECORD NO.:  [id number]  LOCATION:  1531                         FACILITY:  [facility]  PHYSICIAN:  Thornton Park. Daphine Deutscher, MD  DATE OF BIRTH:  [birth year]  DATE OF PROCEDURE:  07/21/2013 DATE OF DISCHARGE:                              OPERATIVE REPORT   PROCEDURE:  Upper endoscopy during takedown of previous lap adjustable gastric band and conversion to sleeve gastrectomy.  SURGEON:  Thornton Park. Daphine Deutscher, MD  DESCRIPTION OF PROCEDURE:  While the patient was asleep, I passed the endoscope without difficulty.  I found my way looking down the esophagus.  The EG junction was at 40 cm.  I then went into the now newly resected gastric sleeve and found the staple line and followed it down to the antrum.  I looked through the pylorus.  I inspected the antrum.  There was no evidence of bleeding.  The staple line was intact and was not leaking or bleeding.  I then decompressed the stomach with air and withdrew the endoscope.  The patient tolerated the procedure well.     Thornton Park Daphine Deutscher, MD     MBM/MEDQ  D:  07/21/2013  T:  07/22/2013  Job:  [id number]

## 2013-07-22 NOTE — Progress Notes (Signed)
Doing well.  Taking liquids okay. Nausea resolved.  UGI okay.  Mild pain.  No apparent complications.

## 2013-07-22 NOTE — Progress Notes (Signed)
1 Day Post-Op  Subjective: HA now.  And BP has been high. Has had nausea when she gets up to walk  Objective: Vital signs in last 24 hours: Temp:  [96.8 F (36 C)-99.6 F (37.6 C)] 98.5 F (36.9 C) (08/27 0530) Pulse Rate:  [64-97] 78 (08/27 0530) Resp:  [12-19] 16 (08/27 0530) BP: (153-176)/(91-111) 159/91 mmHg (08/27 0530) SpO2:  [2 %-100 %] 98 % (08/27 0530) Weight:  [287 lb 6 oz (130.352 kg)] 287 lb 6 oz (130.352 kg) (08/26 1300) Last BM Date: 07/20/13  Intake/Output from previous day: 08/26 0701 - 08/27 0700 In: 5177.1 [I.V.:5177.1] Out: 4195 [Urine:4000; Drains:95; Blood:100] Intake/Output this shift:    General appearance: alert, cooperative and no distress Resp: clear to auscultation bilaterally Cardio: normal rate, regulare GI: soft, incisional tenderness, ND, no peritoneal signs, JP with ss output Extremities: SCD's bilat  Lab Results:   Recent Labs  07/22/13 0415  WBC 9.3  HGB 13.2  HCT 38.3  PLT 305   BMET  Recent Labs  07/22/13 0415  NA 133*  K 4.1  CL 98  CO2 24  GLUCOSE 139*  BUN 4*  CREATININE 0.47*  CALCIUM 9.1   PT/INR No results found for this basename: LABPROT, INR,  in the last 72 hours ABG No results found for this basename: PHART, PCO2, PO2, HCO3,  in the last 72 hours  Studies/Results: No results found.  Anti-infectives: Anti-infectives   Start     Dose/Rate Route Frequency Ordered Stop   07/21/13 0530  ertapenem (INVANZ) 1 g in sodium chloride 0.9 % 50 mL IVPB     1 g 100 mL/hr over 30 Minutes Intravenous  Once 07/21/13 0527 07/21/13 0730      Assessment/Plan: s/p Procedure(s): LAPAROSCOPIC GASTRIC SLEEVE RESECTION, LYSIS OF ADHESIONS, EGD (N/A) UPPER GI ENDOSCOPY LAPAROSCOPIC LYSIS OF ADHESIONS will check UGI today and then consider starting liquids if okay and nausea improves.  restart home BP meds.  mobilize today  LOS: 1 day    Lodema Pilot DAVID 07/22/2013

## 2013-07-22 NOTE — Care Management Note (Signed)
    Page 1 of 1   07/22/2013     10:46:33 AM   CARE MANAGEMENT NOTE 07/22/2013  Patient:  Debra Carpenter, Debra Carpenter   Account Number:  [id number]  Date Initiated:  07/22/2013  Documentation initiated by:  Lorenda Ishihara  Subjective/Objective Assessment:   47 yo female admitted s/p lap lysis, removal of lap band, gastric sleeve. PTA lived at home.     Action/Plan:   Home when stable   Anticipated DC Date:  07/25/2013   Anticipated DC Plan:  HOME/SELF CARE      DC Planning Services  CM consult      Choice offered to / List presented to:             Status of service:  Completed, signed off Medicare Important Message given?   (If response is "NO", the following Medicare IM given date fields will be blank) Date Medicare IM given:   Date Additional Medicare IM given:    Discharge Disposition:  HOME/SELF CARE  Per UR Regulation:  Reviewed for med. necessity/level of care/duration of stay  If discussed at Long Length of Stay Meetings, dates discussed:    Comments:

## 2013-07-22 NOTE — Progress Notes (Signed)
Medicated patient for elevated blood pressure per parameters, will recheck blood pressure within 30 monutes Stanford Breed RN 07-22-2013 10:59am

## 2013-07-23 MED ORDER — ONDANSETRON 4 MG PO TBDP: 4.0000 mg | ORAL_TABLET | Freq: Three times a day (TID) | ORAL | Status: DC | PRN

## 2013-07-23 MED ORDER — OXYCODONE HCL 5 MG/5ML PO SOLN: 5.0000 mg | ORAL | Status: DC | PRN

## 2013-07-23 NOTE — Progress Notes (Signed)
Patient alert and oriented, pain is controlled. Patient is tolerating fluids, plan to advance to protein shake today.  Reviewed Gastric sleeve discharge instructions with patient and patient is able to articulate understanding.  GASTRIC BYPASS / SLEEVE  Home Care Instructions  These instructions are to help you care for yourself when you go home.  Call: If you have any problems.   Call [phone] and ask for the surgeon on call   If you need immediate assistance come to the ER at [facility]. Tell the ER staff that you are a new post-op gastric bypass or gastric sleeve patient   Signs and symptoms to report:   Severe vomiting or nausea o If you cannot handle clear liquids for longer than 1 day, call your surgeon    Abdominal pain which does not get better after taking your pain medication   Fever greater than 100.4 F and chills   Heart rate over 100 beats a minute   Trouble breathing   Chest pain    Redness, swelling, drainage, or foul odor at incision (surgical) sites    If your incisions open or pull apart   Swelling or pain in calf (lower leg)   Diarrhea (Loose bowel movements that happen often), frequent watery, uncontrolled bowel movements   Constipation, (no bowel movements for 3 days) if this happens:  o Take Milk of Magnesia, 2 tablespoons by mouth, 3 times a day for 2 days if needed o Stop taking Milk of Magnesia once you have had a bowel movement o Call your doctor if constipation continues Or o Take Miralax  (instead of Milk of Magnesia) following the label instructions o Stop taking Miralax once you have had a bowel movement o Call your doctor if constipation continues   Anything you think is "abnormal for you"   Normal side effects after surgery:   Unable to sleep at night or unable to concentrate   Irritability   Being tearful (crying) or depressed These are common complaints, possibly related to your anesthesia, stress of surgery and change in lifestyle, that  usually go away a few weeks after surgery.  If these feelings continue, call your medical doctor.  Wound Care: You may have surgical glue, steri-strips, or staples over your incisions after surgery   Surgical glue:  Looks like a clear film over your incisions and will wear off a little at a time   Steri-strips : Adhesive strips of tape over your incisions. You may notice a yellowish color on the skin under the steri-strips. This is used to make the   steri-strips stick better. Do not pull the steri-strips off - let them fall off   Staples: Staples may be removed before you leave the hospital o If you go home with staples, call [facility] at for an appointment with your surgeon's nurse to have staples removed 10 days after surgery, [phone]   Showering: You may shower two (2) days after your surgery unless your surgeon tells you differently o Wash gently around incisions with warm soapy water, rinse well, and gently pat dry  o If you have a drain (tube from your incision), you may need someone to hold this while you shower  o No tub baths until staples are removed and incisions are healed     Medications:   Medications should be liquid or crushed if larger than the size of a dime   Extended release pills (medication that releases a little bit at a time   through the day) should not be crushed   Depending on the size and number of medications you take, you may need to space (take a few throughout the day)/change the time you take your medications so that you do not over-fill your pouch (smaller stomach)   Make sure you follow-up with your primary care physician to make medication changes needed during rapid weight loss and life-style changes   If you have diabetes, follow up with the doctor that orders your diabetes medication(s) within one week after surgery and check your blood sugar regularly.   Do not drive while taking narcotics (pain medications)   Do not take acetaminophen  (Tylenol) and Roxicet or Lortab Elixir at the same time since these pain medications contain acetaminophen  Diet:                    First 2 Weeks  You will see the nutritionist about two (2) weeks after your surgery. The nutritionist will increase the types of foods you can eat if you are handling liquids well:   If you have severe vomiting or nausea and cannot handle clear liquids lasting longer than 1 day, call your surgeon  Protein Shake   Drink at least 2 ounces of shake 5-6 times per day   Each serving of protein shakes (usually 8 - 12 ounces) should have a minimum of:  o 15 grams of protein  o And no more than 5 grams of carbohydrate    Goal for protein each day: o Men = 80 grams per day o Women = 60 grams per day   Protein powder may be added to fluids such as non-fat milk or Lactaid milk or Soy milk (limit to 35 grams added protein powder per serving)  Hydration   Slowly increase the amount of water and other clear liquids as tolerated (See Acceptable Fluids)   Slowly increase the amount of protein shake as tolerated     Sip fluids slowly and throughout the day   May use sugar substitutes in small amounts (no more than 6 - 8 packets per day; i.e. Splenda)  Fluid Goal   The first goal is to drink at least 8 ounces of protein shake/drink per day (or as directed by the nutritionist); some examples of protein shakes are Syntrax Nectar, Adkins Advantage, EAS Edge HP, and Unjury. See handout from pre-op Bariatric Education Class: o Slowly increase the amount of protein shake you drink as tolerated o You may find it easier to slowly sip shakes throughout the day o It is important to get your proteins in first   Your fluid goal is to drink 64 - 100 ounces of fluid daily o It may take a few weeks to build up to this   32 oz (or more) should be clear liquids  And    32 oz (or more) should be full liquids (see below for examples)   Liquids should not contain sugar, caffeine, or  carbonation  Clear Liquids:   Water or Sugar-free flavored water (i.e. Fruit H2O, Propel)   Decaffeinated coffee or tea (sugar-free)   Crystal Lite, Wyler's Lite, Minute Maid Lite   Sugar-free Jell-O   Bouillon or broth   Sugar-free Popsicle:   *Less than 20 calories each; Limit 1 per day  Full Liquids: Protein Shakes/Drinks + 2 choices per day of other full liquids   Full liquids must be: o No More Than 12 grams of Carbs per serving  o No   More Than 3 grams of Fat per serving   Strained low-fat cream soup   Non-Fat milk   Fat-free Lactaid Milk   Sugar-free yogurt (Dannon Lite & Fit, Greek yogurt)      Vitamins and Minerals   Start 1 day after surgery unless otherwise directed by your surgeon   2 Chewable Multivitamin / Multimineral Supplement with iron (i.e. Centrum for Adults)   Vitamin B-12, 350 - 500 micrograms sub-lingual (place tablet under the tongue) each day   Chewable Calcium Citrate with Vitamin D-3 (Example: 3 Chewable Calcium Plus 600 with Vitamin D-3) o Take 500 mg three (3) times a day for a total of 1500 mg each day o Do not take all 3 doses of calcium at one time as it may cause constipation, and you can only absorb 500 mg  at a time  o Do not mix multivitamins containing iron with calcium supplements; take 2 hours apart o Do not substitute Tums (calcium carbonate) for your calcium   Menstruating women and those at risk for anemia (a blood disease that causes weakness) may need extra iron o Talk with your doctor to see if you need more iron   If you need extra iron: Total daily Iron recommendation (including Vitamins) is 50 to 100 mg Iron/day   Do not stop taking or change any vitamins or minerals until you talk to your nutritionist or surgeon   Your nutritionist and/or surgeon must approve all vitamin and mineral supplements   Activity and Exercise: It is important to continue walking at home.  Limit your physical activity as instructed by your doctor.  During  this time, use these guidelines:   Do not lift anything greater than ten (10) pounds for at least two (2) weeks   Do not go back to work or drive until your surgeon says you can   You may have sex when you feel comfortable  o It is VERY important for female patients to use a reliable birth control method; fertility often increases after surgery  o Do not get pregnant for at least 18 months   Start exercising as soon as your doctor tells you that you can o Make sure your doctor approves any physical activity   Start with a simple walking program   Walk 5-15 minutes each day, 7 days per week.    Slowly increase until you are walking 30-45 minutes per day Consider joining our BELT program. [phone] or email [email or web address]   Special Instructions Things to remember:   Free counseling is available for you and your family through collaboration between [facility] and [employer]. Please call [phone] and leave a message   Use your CPAP when sleeping if this applies to you    [facility] has a free Bariatric Surgery Support Group that meets monthly, the 3rd Thursday, 6 pm, [facility] Classrooms You can see classes online at [email or web address]   It is very important to keep all follow up appointments with your surgeon, nutritionist, primary care physician, and behavioral health practitioner o After the first year, please follow up with your bariatric surgeon and nutritionist at least once a year in order to maintain best weight loss results [facility]: [phone] [facility]: [phone] Bariatric Nurse Coordinator: [phone]     

## 2013-07-23 NOTE — Discharge Summary (Signed)
  Physician Discharge Summary  Patient ID: Debra Carpenter MRN: [id number] DOB/AGE: 12/25/1965 47 y.o.  Admit date: 07/21/2013 Discharge date: 07/23/2013  Admission Diagnoses: obesity  Discharge Diagnoses: same Active Problems:   * No active hospital problems. *   Discharged Condition: stable  Hospital Course: to OR 07/21/13 for lap sleeve gastrectomy after band removal.  No apparent complications.  UGI on POD 1 without evidence of leak.  Diet advanced and she tolerated this well.  She was ambulatory and HD stable and ready for discharge on POD 2 with drain in place.  Consults: None  Significant Diagnostic Studies: UGI 07/22/13  Treatments: surgery: lap sleeve gastrectomy/EGD 07/21/13   Disposition: 01-Home or Self Care  Discharge Orders   Future Appointments Provider Department Dept Phone   08/04/2013 3:30 PM [facility] Post-Op Class [facility] [phone]   08/07/2013 9:00 AM Lodema Pilot, DO [facility], [place] [phone]   Future Orders Complete By Expires   Call MD for:  difficulty breathing, headache or visual disturbances  As directed    Call MD for:  persistant dizziness or light-headedness  As directed    Call MD for:  persistant nausea and vomiting  As directed    Call MD for:  redness, tenderness, or signs of infection (pain, swelling, redness, odor or green/yellow discharge around incision site)  As directed    Call MD for:  severe uncontrolled pain  As directed    Call MD for:  temperature >100.4  As directed    Discharge instructions  As directed    Comments:     May shower tomorrow.   Call [phone] for follow up appointment with Biagio Quint in 1 week for drain removal. May start vitamins and protein supplements. May start full liquid diet tomorrow x1 week, then pureed diet x1 week, then soft diet x1 week, then advance to high protein, low fat, low carb diet  Increase activity as tolerated. Drink plenty of fluids. Crush  all meds or take liquid meds x4 weeks.   Increase activity slowly  As directed        Medication List    STOP taking these medications       naproxen sodium 220 MG tablet  Commonly known as:  ANAPROX      TAKE these medications       cetirizine 10 MG tablet  Commonly known as:  ZYRTEC  Take 10 mg by mouth daily.     FLUoxetine 10 MG capsule  Commonly known as:  PROZAC  Take 10 mg by mouth every morning.     losartan 50 MG tablet  Commonly known as:  COZAAR  Take 50 mg by mouth every morning.     ondansetron 4 MG disintegrating tablet  Commonly known as:  ZOFRAN ODT  Take 1 tablet (4 mg total) by mouth every 8 (eight) hours as needed for nausea.     oxyCODONE 5 MG/5ML solution  Commonly known as:  ROXICODONE  Take 5-7.5 mLs (5-7.5 mg total) by mouth every 4 (four) hours as needed.         SignedLodema Pilot DAVID 07/23/2013, 7:33 AM

## 2013-07-23 NOTE — Progress Notes (Signed)
2 Days Post-Op  Subjective: No issues overnight.  Tolerating liquids.  Pain controlled with toradol  Objective: Vital signs in last 24 hours: Temp:  [98 F (36.7 C)-99 F (37.2 C)] 98 F (36.7 C) (08/28 0549) Pulse Rate:  [62-78] 78 (08/28 0549) Resp:  [16-18] 16 (08/28 0549) BP: (92-187)/(64-90) 92/64 mmHg (08/28 0549) SpO2:  [95 %-100 %] 95 % (08/28 0549) Last BM Date: 07/20/13  Intake/Output from previous day: 08/27 0701 - 08/28 0700 In: 2890.8 [P.O.:120; I.V.:2770.8] Out: 4470 [Urine:4400; Drains:70] Intake/Output this shift:    General appearance: alert, cooperative and no distress Resp: clear to auscultation bilaterally Cardio: normal rate, regular GI: soft, mild incisional tenderness, ND, wounds without infection, JP ss, no peritoneal signs Extremities: SCD's bilat. LE  Lab Results:   Recent Labs  07/22/13 0415  WBC 9.3  HGB 13.2  HCT 38.3  PLT 305   BMET  Recent Labs  07/22/13 0415  NA 133*  K 4.1  CL 98  CO2 24  GLUCOSE 139*  BUN 4*  CREATININE 0.47*  CALCIUM 9.1   PT/INR No results found for this basename: LABPROT, INR,  in the last 72 hours ABG No results found for this basename: PHART, PCO2, PO2, HCO3,  in the last 72 hours  Studies/Results: Dg Ugi W/water Sol Cm  07/22/2013   *RADIOLOGY REPORT*  Clinical Data:Status post sleeve gastrectomy.  ESOPHAGUS/BARIUM SWALLOW/TABLET STUDY  Fluoroscopy Time: 2 minutes and 50 seconds.  Comparison: 02/23/2013.  Findings: Preprocedural KUB demonstrates a surgical drain projecting over the left upper quadrant of the abdomen.  Bowel gas pattern is nonobstructive.  Subsequently, a small volume of Omnipaque-300 was ingested by the patient and multiple sequential fluoroscopic spot films were obtained.  These demonstrated transit of contrast through the esophagus into the stomach, at which point the contour of the stomach had a characteristic appearance compatible with recent sleeve gastrectomy.  Contrast was  confined to the lesser curvature of the stomach, with lack of opacification of any of the greater curvature of the stomach.  Contrast extended through the pylorus and into the proximal small bowel without evidence of obstruction. No leakage of contrast beyond the suture line in the stomach was identified at any time during the examination.  IMPRESSION: 1.  Expected postoperative changes related to recent sleeve gastrectomy without evidence of leak or obstruction.   Original Report Authenticated By: Trudie Reed, M.D.    Anti-infectives: Anti-infectives   Start     Dose/Rate Route Frequency Ordered Stop   07/21/13 0530  ertapenem (INVANZ) 1 g in sodium chloride 0.9 % 50 mL IVPB     1 g 100 mL/hr over 30 Minutes Intravenous  Once 07/21/13 0527 07/21/13 0730      Assessment/Plan: s/p Procedure(s): LAPAROSCOPIC GASTRIC SLEEVE RESECTION, LYSIS OF ADHESIONS, EGD (N/A) UPPER GI ENDOSCOPY LAPAROSCOPIC LYSIS OF ADHESIONS she looks okay this morning.  no evidence of postop complication.  she should be okay for discharge if tolerating liquid pain meds.  LOS: 2 days    Lodema Pilot DAVID 07/23/2013

## 2013-07-30 ENCOUNTER — Ambulatory Visit (INDEPENDENT_AMBULATORY_CARE_PROVIDER_SITE_OTHER): Payer: BC Managed Care – PPO | Admitting: General Surgery

## 2013-07-30 ENCOUNTER — Encounter (INDEPENDENT_AMBULATORY_CARE_PROVIDER_SITE_OTHER): Payer: Self-pay | Admitting: General Surgery

## 2013-07-30 VITALS — BP 124/80 | HR 82 | Resp 18 | Ht 69.0 in | Wt 278.4 lb

## 2013-07-30 DIAGNOSIS — Z5189 Encounter for other specified aftercare: Secondary | ICD-10-CM

## 2013-07-30 DIAGNOSIS — Z4889 Encounter for other specified surgical aftercare: Secondary | ICD-10-CM

## 2013-07-30 NOTE — Progress Notes (Signed)
Subjective:     Patient ID: Debra Carpenter, female   DOB: 01/22/1966, 47 y.o.   MRN: [id number]  HPI This patient follows up 9 days status post vertical sleeve gastrectomy after lap band removal. She says that she is "feeling great". She denies any abdominal pains and has returned to work. She is off her pain medications and says that she has no nausea or vomiting or reflux. She denies any fevers or chills. She is walking 2 miles a day.  Review of Systems     Objective:   Physical Exam She is in no acute distress and nontoxic-appearing Her abdomen is soft and nontender on exam her incisions are healing nicely without sign of infection. Her JP drain has minimal serous sanguinous output.    Assessment:     Status post vertical sleeve gastrectomy-doing well She is doing very well from the procedure and there is no evidence of any postoperative complications. Do not see any evidence of leak and she denies any fevers or chills or abdominal pain I think it's reasonable at this time to remove her drains which I did today in the office. We reviewed the postoperative diet and activity recommendations and she is already returning to work.  I recommended that she continue with the postoperative diet and gradually increase activity as tolerated and I will see her back in about 2 weeks for repeat exam.    Plan:     Continue with bariatric diet and postoperative diet gradually increase activity as tolerated  follow up in 2 weeks

## 2013-07-31 ENCOUNTER — Ambulatory Visit (INDEPENDENT_AMBULATORY_CARE_PROVIDER_SITE_OTHER): Payer: BC Managed Care – PPO | Admitting: General Surgery

## 2013-07-31 ENCOUNTER — Telehealth: Payer: Self-pay | Admitting: *Deleted

## 2013-07-31 NOTE — Telephone Encounter (Signed)
Per phone msg from 07/15/13 pt was having surgery and wanted to wait until after this to have CT Chest done.  Called, spoke with pt.  Pt has now had surgery.  Reports a cxr was done prior to surgery and would like to know if Dr. Delford Field can look at this and advise if CT Chest is still needed.  Dr. Delford Field, pls advise.  Thank you.

## 2013-07-31 NOTE — Telephone Encounter (Signed)
Message copied by Valentino Hue on Fri Jul 31, 2013 10:34 AM ------      Message from: Shan Levans E      Created: Thu Jul 30, 2013  1:57 PM       CT Chest ordered but not done yet       Can you f/u as to why?            Pt needs her yearly CT Chest for lung nodule F/U            pw      ----- Message -----         From: Storm Frisk, MD         Sent: 06/23/2012   5:05 PM           To: Storm Frisk, MD            chck Ct Chest       ------

## 2013-07-31 NOTE — Telephone Encounter (Signed)
[id number] to advise the pt. Jennifer Castillo, CMA  

## 2013-07-31 NOTE — Telephone Encounter (Signed)
Need a CT,  The xray does not show nodule well and radiology recommends a CT

## 2013-07-31 NOTE — Telephone Encounter (Signed)
lmomtcb for pt 

## 2013-08-03 NOTE — Telephone Encounter (Signed)
lmomtcb for pt 

## 2013-08-05 NOTE — Telephone Encounter (Signed)
noted 

## 2013-08-05 NOTE — Telephone Encounter (Signed)
Called, spoke with pt.  Informed her of below recs per Dr. Delford Field regarding the CT Chest IS still needed. Pt verbalized understanding of this but states she would like to hold off on scheduling CT Chest for now.  Pt states she will call back when she is ready to have the scan done.  I also offered to schedule a f/u with Dr. Delford Field.  She declined for now.  Will sign off and route to PW as FYI.

## 2013-08-07 ENCOUNTER — Encounter (INDEPENDENT_AMBULATORY_CARE_PROVIDER_SITE_OTHER): Payer: BC Managed Care – PPO | Admitting: General Surgery

## 2013-08-11 ENCOUNTER — Ambulatory Visit: Payer: BC Managed Care – PPO | Admitting: Dietician

## 2013-08-14 ENCOUNTER — Ambulatory Visit (INDEPENDENT_AMBULATORY_CARE_PROVIDER_SITE_OTHER): Payer: BC Managed Care – PPO | Admitting: General Surgery

## 2013-08-14 ENCOUNTER — Encounter (INDEPENDENT_AMBULATORY_CARE_PROVIDER_SITE_OTHER): Payer: Self-pay | Admitting: General Surgery

## 2013-08-14 VITALS — BP 122/82 | HR 68 | Temp 98.0°F | Resp 14 | Ht 69.0 in | Wt 273.0 lb

## 2013-08-14 DIAGNOSIS — Z5189 Encounter for other specified aftercare: Secondary | ICD-10-CM

## 2013-08-14 DIAGNOSIS — Z4889 Encounter for other specified surgical aftercare: Secondary | ICD-10-CM

## 2013-08-14 NOTE — Progress Notes (Signed)
Subjective:     Patient ID: Debra Carpenter, female   DOB: [birth year], 47 y.o.   MRN: [id number]  HPI The patient follows up 3 weeks status post vertical sleeve gastrectomy after removal of an adjustable gastric band. She is doing very well without any complaints. She has lost about 18-19 pounds since her procedure and has no food intolerance. She is taking her protein supplements and says that she's getting about 40 g of protein per day she is also taking her vitamins. She has no nausea vomiting and denies any abdominal pain. She says that she is getting about 600 calories per day. She is walking about 3 miles every other day and using elliptical every other day. She says that her bowels are functioning normally  Review of Systems     Objective:   Physical Exam  her abdomen is soft and nontender and exam Her incisions are healing nicely without any sign of infection. There is no neurologic deficits    Assessment:     Status post vertical sleeve gastrectomy-doing well She is doing very well from her procedure without any evidence of postoperative competition. She is appropriate on her weight loss. We talked about taking about 50 g of protein per day drinking plenty of fluids. Recommended that she gradually increased her activity as tolerated to maximize weight loss.     Plan:     Continue bariatric diet  protein and vitamin intake Continuous exercise and I will follow up with her in 2 months

## 2013-08-19 ENCOUNTER — Ambulatory Visit: Payer: BC Managed Care – PPO | Admitting: *Deleted

## 2013-10-01 ENCOUNTER — Other Ambulatory Visit: Payer: Self-pay

## 2013-10-14 ENCOUNTER — Ambulatory Visit (INDEPENDENT_AMBULATORY_CARE_PROVIDER_SITE_OTHER): Payer: BC Managed Care – PPO | Admitting: General Surgery

## 2013-10-14 ENCOUNTER — Encounter (INDEPENDENT_AMBULATORY_CARE_PROVIDER_SITE_OTHER): Payer: Self-pay | Admitting: General Surgery

## 2013-10-14 VITALS — BP 122/78 | HR 66 | Temp 97.3°F | Ht 68.5 in | Wt 252.2 lb

## 2013-10-14 DIAGNOSIS — Z4889 Encounter for other specified surgical aftercare: Secondary | ICD-10-CM

## 2013-10-14 DIAGNOSIS — K912 Postsurgical malabsorption, not elsewhere classified: Secondary | ICD-10-CM

## 2013-10-14 DIAGNOSIS — Z5189 Encounter for other specified aftercare: Secondary | ICD-10-CM

## 2013-10-14 LAB — COMPREHENSIVE METABOLIC PANEL
ALT: 12 U/L (ref 0–35)
AST: 21 U/L (ref 0–37)
BUN: 6 mg/dL (ref 6–23)
Calcium: 9 mg/dL (ref 8.4–10.5)
Creat: 0.48 mg/dL — ABNORMAL LOW (ref 0.50–1.10)
Total Bilirubin: 0.5 mg/dL (ref 0.3–1.2)

## 2013-10-14 LAB — CBC WITH DIFFERENTIAL/PLATELET
Basophils Absolute: 0 10*3/uL (ref 0.0–0.1)
Basophils Relative: 1 % (ref 0–1)
Eosinophils Absolute: 0.1 10*3/uL (ref 0.0–0.7)
Eosinophils Relative: 3 % (ref 0–5)
HCT: 37.3 % (ref 36.0–46.0)
MCH: 28.6 pg (ref 26.0–34.0)
MCHC: 34.3 g/dL (ref 30.0–36.0)
MCV: 83.3 fL (ref 78.0–100.0)
Monocytes Absolute: 0.6 10*3/uL (ref 0.1–1.0)
Platelets: 314 10*3/uL (ref 150–400)
RDW: 14.7 % (ref 11.5–15.5)
WBC: 3.8 10*3/uL — ABNORMAL LOW (ref 4.0–10.5)

## 2013-10-14 LAB — IRON AND TIBC
TIBC: 267 ug/dL (ref 250–470)
UIBC: 198 ug/dL (ref 125–400)

## 2013-10-14 NOTE — Progress Notes (Signed)
Subjective:     Patient ID: Debra Carpenter, female   DOB: 1965-12-13, 47 y.o.   MRN: [id number]  HPI The patient follows up almost 3 months status post vertical sleeve gastrectomy. She's doing very well from her procedure without any evidence of any postoperative complication. She denies any heartburn or abdominal pain.  She has no food intolerance or nausea or vomiting. She is taking any protein. She denies any hunger. She is taking her vitamins and denies any neurologic symptoms such as numbness or tingling or weakness. She says that she is exercising twice per week  Review of Systems     Objective:   Physical Exam Is in no acute distress and nontoxic-appearing Her abdomen is soft and nontender exam her incisions are healing nicely. There is no evidence of any neurologic deficits    Assessment:     Status post vertical sleeve gastrectomy-doing well She did very well from her procedure and is very satisfied. She's lost about 20 pounds since her procedure. We discussed the dietary modifications that she can do to augment her weight loss. Itself and she's getting hernia protein intake and her vitamins. I recommended that she increase her physical activity in order to maximize her weight loss. She has no food intolerance and seems to be doing pretty well.    Plan:     Increase exercise and physical activity. Keep a food journal. We will check some nutrition labs and she will followup in 3 months

## 2013-10-18 LAB — VITAMIN D 1,25 DIHYDROXY
Vitamin D 1, 25 (OH)2 Total: 61 pg/mL (ref 18–72)
Vitamin D2 1, 25 (OH)2: <8 pg/mL
Vitamin D3 1, 25 (OH)2: 61 pg/mL

## 2013-11-13 ENCOUNTER — Telehealth (INDEPENDENT_AMBULATORY_CARE_PROVIDER_SITE_OTHER): Payer: Self-pay

## 2013-11-13 NOTE — Telephone Encounter (Signed)
Called and spoke to patient to make aware that her B1 (thiamine) is low/  Patient advised to add B1 (Thiamine) 100 mg daily.  Explained to the patient the severity of having low B1 and that is can be potentially life threatening.  Patient verbalized understanding and agrees with plan as above

## 2013-12-15 ENCOUNTER — Encounter (INDEPENDENT_AMBULATORY_CARE_PROVIDER_SITE_OTHER): Payer: Self-pay | Admitting: General Surgery

## 2014-01-28 ENCOUNTER — Ambulatory Visit (INDEPENDENT_AMBULATORY_CARE_PROVIDER_SITE_OTHER): Payer: BC Managed Care – PPO | Admitting: General Surgery

## 2014-02-04 ENCOUNTER — Encounter (INDEPENDENT_AMBULATORY_CARE_PROVIDER_SITE_OTHER): Payer: Self-pay | Admitting: General Surgery

## 2014-03-29 ENCOUNTER — Encounter (INDEPENDENT_AMBULATORY_CARE_PROVIDER_SITE_OTHER): Payer: Self-pay | Admitting: General Surgery

## 2014-05-05 ENCOUNTER — Ambulatory Visit (INDEPENDENT_AMBULATORY_CARE_PROVIDER_SITE_OTHER): Payer: BC Managed Care – PPO | Admitting: General Surgery

## 2014-06-16 ENCOUNTER — Encounter (HOSPITAL_BASED_OUTPATIENT_CLINIC_OR_DEPARTMENT_OTHER): Payer: Self-pay | Admitting: Emergency Medicine

## 2014-06-16 ENCOUNTER — Emergency Department (HOSPITAL_BASED_OUTPATIENT_CLINIC_OR_DEPARTMENT_OTHER)
Admission: EM | Admit: 2014-06-16 | Discharge: 2014-06-17 | Disposition: A | Discharge status: Home / Self Care | Payer: BC Managed Care – PPO | Attending: Emergency Medicine | Admitting: Emergency Medicine

## 2014-06-16 DIAGNOSIS — G4459 Other complicated headache syndrome: Secondary | ICD-10-CM | POA: Insufficient documentation

## 2014-06-16 DIAGNOSIS — H6690 Otitis media, unspecified, unspecified ear: Secondary | ICD-10-CM | POA: Insufficient documentation

## 2014-06-16 DIAGNOSIS — R42 Dizziness and giddiness: Secondary | ICD-10-CM | POA: Insufficient documentation

## 2014-06-16 DIAGNOSIS — Z8719 Personal history of other diseases of the digestive system: Secondary | ICD-10-CM | POA: Insufficient documentation

## 2014-06-16 DIAGNOSIS — M62838 Other muscle spasm: Secondary | ICD-10-CM | POA: Insufficient documentation

## 2014-06-16 DIAGNOSIS — Q782 Osteopetrosis: Secondary | ICD-10-CM | POA: Insufficient documentation

## 2014-06-16 DIAGNOSIS — Z8659 Personal history of other mental and behavioral disorders: Secondary | ICD-10-CM | POA: Insufficient documentation

## 2014-06-16 DIAGNOSIS — I10 Essential (primary) hypertension: Secondary | ICD-10-CM | POA: Insufficient documentation

## 2014-06-16 DIAGNOSIS — Z79899 Other long term (current) drug therapy: Secondary | ICD-10-CM | POA: Insufficient documentation

## 2014-06-16 DIAGNOSIS — H6691 Otitis media, unspecified, right ear: Secondary | ICD-10-CM

## 2014-06-16 DIAGNOSIS — R011 Cardiac murmur, unspecified: Secondary | ICD-10-CM | POA: Insufficient documentation

## 2014-06-16 MED ORDER — DIPHENHYDRAMINE HCL 25 MG PO CAPS
25.0000 mg | ORAL_CAPSULE | Freq: Once | ORAL | Status: AC
  Administered 2014-06-17: 25 mg via ORAL
  Filled 2014-06-16: qty 1

## 2014-06-16 MED ORDER — KETOROLAC TROMETHAMINE 60 MG/2ML IM SOLN
60.0000 mg | Freq: Once | INTRAMUSCULAR | Status: AC
  Administered 2014-06-17: 60 mg via INTRAMUSCULAR
  Filled 2014-06-16: qty 2

## 2014-06-16 MED ORDER — METOCLOPRAMIDE HCL 10 MG PO TABS
10.0000 mg | ORAL_TABLET | Freq: Once | ORAL | Status: AC
  Administered 2014-06-17: 10 mg via ORAL
  Filled 2014-06-16: qty 1

## 2014-06-16 MED ORDER — METHOCARBAMOL 500 MG PO TABS
1000.0000 mg | ORAL_TABLET | Freq: Once | ORAL | Status: AC
  Administered 2014-06-17: 1000 mg via ORAL
  Filled 2014-06-16: qty 2

## 2014-06-16 NOTE — ED Notes (Signed)
Pt c/o headache , dizziness nausea x 2 days

## 2014-06-16 NOTE — ED Provider Notes (Signed)
CSN: [id number]     Arrival date & time 06/16/14  2206 History  This chart was scribed for Debra Sponsel Smitty CordsK Theone Bowell-Rasch, MD, by Yevette EdwardsAngela Bracken, ED Scribe. This patient was seen in room MH05/MH05 and the patient's care was started at 11:47 PM.  First MD Initiated Contact with Patient 06/16/14 2334     Chief Complaint  Patient presents with  . Headache    Patient is a 48 y.o. female presenting with headaches. The history is provided by the patient and a relative. No language interpreter was used.  Headache Pain location:  Frontal Quality:  Dull Radiates to:  Does not radiate Severity currently:  8/10 Severity at highest:  8/10 Onset quality:  Gradual Duration:  2 days Timing:  Constant Progression:  Unchanged Chronicity:  New Context: not caffeine   Relieved by:  Nothing Worsened by:  Light Ineffective treatments:  Acetaminophen Associated symptoms: dizziness   Associated symptoms: no abdominal pain, no back pain, no blurred vision, no congestion, no cough, no ear pain, no pain, no facial pain, no fever, no focal weakness, no hearing loss, no myalgias, no neck pain, no neck stiffness, no numbness, no paresthesias, no seizures, no sinus pressure, no sore throat, no syncope, no tingling, no visual change, no vomiting and no weakness   Risk factors: no family hx of SAH    HPI Comments: Debra Carpenter is a 48 y.o. female, with a h/o HTN and anxiety, who presents to the Emergency Department complaining of a frontal headache which began yesterday and which has been associated with dizziness, lightheadedness, and nausea.  She reports a sensation of the room is spinning and she reports that dizziness was the first symptom she developed. She has used Tylenol without resolution; the last dose was six hours ago. She denies f/c/r no rashes on the skin no sinus pressure, congestion, eye symptoms, cough, tinnitus, or rash. She also denies tick exposure, recent travels, or recent swimming.    Past Medical  History  Diagnosis Date  . GERD (gastroesophageal reflux disease)   . Hypertension   . Anxiety and depression   . Lung nodule 08/24/2011  . Bony sclerosis 08/22/2011  . Heart murmur   . Anginal pain 9/12    states negative cardiology workup exception of murmur  . Anxiety   . Depression    Past Surgical History  Procedure Laterality Date  . Laparoscopic gastric banding  2011  . Abdominal hysterectomy  2007  . Laparoscopic repair and removal of gastric band N/A 05/05/2013    Procedure: LAPAROSCOPIC REMOVAL OF ADJUSTABLE GASTRIC BAND AND PORT, ENDOSCOPY ;  Surgeon: Lodema PilotBrian Layton, DO;  Location: [facility];  Service: General;  Laterality: N/A;  laparoscopic removal of adjustable gastric band AND PORT   . Laparoscopic gastric sleeve resection N/A 07/21/2013    Procedure: LAPAROSCOPIC GASTRIC SLEEVE RESECTION, LYSIS OF ADHESIONS, EGD;  Surgeon: Lodema PilotBrian Layton, DO;  Location: [facility];  Service: General;  Laterality: N/A;  . Upper gi endoscopy  07/21/2013    Procedure: UPPER GI ENDOSCOPY;  Surgeon: Lodema PilotBrian Layton, DO;  Location: [facility];  Service: General;;  . Laparoscopic lysis of adhesions  07/21/2013    Procedure: LAPAROSCOPIC LYSIS OF ADHESIONS;  Surgeon: Lodema PilotBrian Layton, DO;  Location: [facility];  Service: General;;   Family History  Problem Relation Age of Onset  . Hypertension Mother   . Hypertension Father   . Cancer Father     ? colon cancer  . Cancer Maternal Grandmother     ovarian  cancer  . Hypertension Maternal Grandfather   . Hypertension Paternal Grandfather    History  Substance Use Topics  . Smoking status: Never Smoker   . Smokeless tobacco: Never Used  . Alcohol Use: No   No OB history provided.  Review of Systems  Constitutional: Negative for fever.  HENT: Negative for congestion, ear pain, hearing loss, sinus pressure, sore throat and tinnitus.   Eyes: Negative for blurred vision, pain, redness and itching.  Respiratory: Negative for cough.   Cardiovascular: Negative for  syncope.  Gastrointestinal: Negative for vomiting and abdominal pain.  Musculoskeletal: Negative for back pain, myalgias, neck pain and neck stiffness.  Skin: Negative for rash.  Neurological: Positive for dizziness, light-headedness and headaches. Negative for tremors, focal weakness, seizures, syncope, facial asymmetry, weakness, numbness and paresthesias.  All other systems reviewed and are negative.   Allergies  Adhesive  Home Medications   Prior to Admission medications   Medication Sig Start Date End Date Taking? Authorizing Provider  cetirizine (ZYRTEC) 10 MG tablet Take 10 mg by mouth daily.    Historical Provider, MD  ondansetron (ZOFRAN ODT) 4 MG disintegrating tablet Take 1 tablet (4 mg total) by mouth every 8 (eight) hours as needed for nausea. 07/23/13   Lodema Pilot, DO   Triage Vitals: BP 160/89  Pulse 59  Temp(Src) 98.4 F (36.9 C) (Oral)  Resp 16  Ht 5\' 9"  (1.753 m)  Wt 224 lb (101.606 kg)  BMI 33.06 kg/m2  SpO2 100%  Physical Exam  Constitutional: She is oriented to person, place, and time. She appears well-developed and well-nourished. No distress.  HENT:  Head: Normocephalic and atraumatic.  Right Ear: Tympanic membrane is not bulging.  Left Ear: Tympanic membrane normal. Tympanic membrane is not bulging.  Mouth/Throat: Oropharynx is clear and moist.  Otitis media of right ear.  No lymph nodes. Mastoids are normal.   No temporal bulge or tenderness   Eyes: Conjunctivae and EOM are normal. Pupils are equal, round, and reactive to light. No scleral icterus.  Neck: Normal range of motion. Neck supple. No tracheal deviation present.  No meningismus  Cardiovascular: Normal rate, regular rhythm, normal heart sounds and intact distal pulses.   Pulmonary/Chest: Effort normal and breath sounds normal. No respiratory distress. She has no wheezes. She has no rales.  Abdominal: Soft. Bowel sounds are normal. There is no tenderness. There is no rebound and no  guarding.  Musculoskeletal: Normal range of motion. She exhibits no edema and no tenderness.  Spasm to right trapezius.   Lymphadenopathy:    She has no cervical adenopathy.  Neurological: She is alert and oriented to person, place, and time. She has normal reflexes. She displays normal reflexes. No cranial nerve deficit. She exhibits normal muscle tone. Coordination normal.  5/5 strength x 4 intact cognition  Skin: Skin is warm and dry.  Psychiatric: She has a normal mood and affect.    ED Course  Procedures (including critical care time)  DIAGNOSTIC STUDIES: Oxygen Saturation is 100% on room air, normal by my interpretation.    COORDINATION OF CARE:  11:55 PM- Discussed treatment plan with patient, and the patient agreed to the plan.   Labs Review Labs Reviewed - No data to display  Imaging Review No results found.   EKG Interpretation None      MDM   Final diagnoses:  None   Medications  divalproex (DEPAKOTE) DR tablet 500 mg (500 mg Oral Given 06/17/14 0222)  ketorolac (TORADOL) injection 60 mg (  60 mg Intramuscular Given 06/17/14 0008)  metoCLOPramide (REGLAN) tablet 10 mg (10 mg Oral Given 06/17/14 0008)  methocarbamol (ROBAXIN) tablet 1,000 mg (1,000 mg Oral Given 06/17/14 0008)  diphenhydrAMINE (BENADRYL) capsule 25 mg (25 mg Oral Given 06/17/14 0008)  promethazine (PHENERGAN) tablet 25 mg (25 mg Oral Given 06/17/14 0222)  amoxicillin (AMOXIL) capsule 500 mg (500 mg Oral Given 06/17/14 0222)     Headache is markedly improved post first medication have added additional medication.  Suspect vertigo is caused by otitis media.  Will treat with amoxicillin.  Follow up with your PMD for recheck.  No tick exposure no fevers no rashes no neck pain or stiffness normal head CT.  No indication for LP, highly doubt meningitis.  No changes in extraocular movements or in cognition, highly doubt dural sinus thrombosis.  Follow up with your family doctor for recheck.  Return for focal  weakness or numbness changes in speech or thinking.  Fevers rashes on the skin or stiff neck.    I personally performed the services described in this documentation, which was scribed in my presence. The recorded information has been reviewed and is accurate.    Jasmine Awe, MD 06/17/14 [phone]

## 2014-06-17 ENCOUNTER — Encounter (HOSPITAL_BASED_OUTPATIENT_CLINIC_OR_DEPARTMENT_OTHER): Payer: Self-pay | Admitting: Emergency Medicine

## 2014-06-17 ENCOUNTER — Emergency Department (HOSPITAL_BASED_OUTPATIENT_CLINIC_OR_DEPARTMENT_OTHER): Payer: BC Managed Care – PPO

## 2014-06-17 LAB — URINALYSIS, ROUTINE W REFLEX MICROSCOPIC
Bilirubin Urine: NEGATIVE
Glucose, UA: NEGATIVE mg/dL
Hgb urine dipstick: NEGATIVE
KETONES UR: NEGATIVE mg/dL
LEUKOCYTES UA: NEGATIVE
NITRITE: NEGATIVE
PH: 6 (ref 5.0–8.0)
Protein, ur: NEGATIVE mg/dL
Specific Gravity, Urine: 1.017 (ref 1.005–1.030)
Urobilinogen, UA: 1 mg/dL (ref 0.0–1.0)

## 2014-06-17 MED ORDER — NAPROXEN 500 MG PO TABS: 500.0000 mg | ORAL_TABLET | Freq: Two times a day (BID) | ORAL | Status: DC

## 2014-06-17 MED ORDER — AMOXICILLIN 500 MG PO CAPS
500.0000 mg | ORAL_CAPSULE | Freq: Once | ORAL | Status: AC
  Administered 2014-06-17: 500 mg via ORAL
  Filled 2014-06-17: qty 1

## 2014-06-17 MED ORDER — PROCHLORPERAZINE 25 MG RE SUPP: 25.0000 mg | Freq: Two times a day (BID) | RECTAL | Status: DC | PRN

## 2014-06-17 MED ORDER — DIVALPROEX SODIUM 250 MG PO DR TAB
500.0000 mg | DELAYED_RELEASE_TABLET | Freq: Two times a day (BID) | ORAL | Status: DC
  Administered 2014-06-17: 500 mg via ORAL
  Filled 2014-06-17: qty 2

## 2014-06-17 MED ORDER — AMOXICILLIN 500 MG PO CAPS
500.0000 mg | ORAL_CAPSULE | Freq: Three times a day (TID) | ORAL | Status: DC
Start: 2014-06-17 — End: 2015-01-10

## 2014-06-17 MED ORDER — PROMETHAZINE HCL 25 MG PO TABS
25.0000 mg | ORAL_TABLET | Freq: Once | ORAL | Status: AC
  Administered 2014-06-17: 25 mg via ORAL
  Filled 2014-06-17: qty 1

## 2015-01-09 ENCOUNTER — Encounter (HOSPITAL_BASED_OUTPATIENT_CLINIC_OR_DEPARTMENT_OTHER): Payer: Self-pay

## 2015-01-09 ENCOUNTER — Emergency Department (HOSPITAL_BASED_OUTPATIENT_CLINIC_OR_DEPARTMENT_OTHER)
Admission: EM | Admit: 2015-01-09 | Discharge: 2015-01-10 | Disposition: A | Discharge status: Home / Self Care | Payer: BLUE CROSS/BLUE SHIELD | Attending: Emergency Medicine | Admitting: Emergency Medicine

## 2015-01-09 DIAGNOSIS — Q782 Osteopetrosis: Secondary | ICD-10-CM | POA: Insufficient documentation

## 2015-01-09 DIAGNOSIS — R51 Headache: Secondary | ICD-10-CM | POA: Diagnosis present

## 2015-01-09 DIAGNOSIS — Z791 Long term (current) use of non-steroidal anti-inflammatories (NSAID): Secondary | ICD-10-CM | POA: Diagnosis not present

## 2015-01-09 DIAGNOSIS — Z8659 Personal history of other mental and behavioral disorders: Secondary | ICD-10-CM | POA: Diagnosis not present

## 2015-01-09 DIAGNOSIS — D72810 Lymphocytopenia: Secondary | ICD-10-CM

## 2015-01-09 DIAGNOSIS — Z79899 Other long term (current) drug therapy: Secondary | ICD-10-CM | POA: Insufficient documentation

## 2015-01-09 DIAGNOSIS — R011 Cardiac murmur, unspecified: Secondary | ICD-10-CM | POA: Diagnosis not present

## 2015-01-09 DIAGNOSIS — K529 Noninfective gastroenteritis and colitis, unspecified: Secondary | ICD-10-CM | POA: Insufficient documentation

## 2015-01-09 DIAGNOSIS — Z792 Long term (current) use of antibiotics: Secondary | ICD-10-CM | POA: Insufficient documentation

## 2015-01-09 DIAGNOSIS — I10 Essential (primary) hypertension: Secondary | ICD-10-CM | POA: Diagnosis not present

## 2015-01-09 LAB — CBC WITH DIFFERENTIAL/PLATELET
BASOS ABS: 0 10*3/uL (ref 0.0–0.1)
Basophils Relative: 0 % (ref 0–1)
EOS ABS: 0 10*3/uL (ref 0.0–0.7)
EOS PCT: 0 % (ref 0–5)
HCT: 41.1 % (ref 36.0–46.0)
Hemoglobin: 14.2 g/dL (ref 12.0–15.0)
LYMPHS PCT: 15 % (ref 12–46)
Lymphs Abs: 0.4 10*3/uL — ABNORMAL LOW (ref 0.7–4.0)
MCH: 28.5 pg (ref 26.0–34.0)
MCHC: 34.5 g/dL (ref 30.0–36.0)
MCV: 82.4 fL (ref 78.0–100.0)
Monocytes Absolute: 0.5 10*3/uL (ref 0.1–1.0)
Monocytes Relative: 18 % — ABNORMAL HIGH (ref 3–12)
NEUTROS PCT: 67 % (ref 43–77)
Neutro Abs: 1.9 10*3/uL (ref 1.7–7.7)
PLATELETS: 261 10*3/uL (ref 150–400)
RBC: 4.99 MIL/uL (ref 3.87–5.11)
RDW: 12.1 % (ref 11.5–15.5)
WBC: 2.8 10*3/uL — ABNORMAL LOW (ref 4.0–10.5)

## 2015-01-09 LAB — COMPREHENSIVE METABOLIC PANEL
ALBUMIN: 4 g/dL (ref 3.5–5.2)
ALK PHOS: 99 U/L (ref 39–117)
ALT: 19 U/L (ref 0–35)
AST: 26 U/L (ref 0–37)
Anion gap: 5 (ref 5–15)
BUN: 11 mg/dL (ref 6–23)
CALCIUM: 8.6 mg/dL (ref 8.4–10.5)
CHLORIDE: 103 mmol/L (ref 96–112)
CO2: 24 mmol/L (ref 19–32)
CREATININE: 0.51 mg/dL (ref 0.50–1.10)
GFR calc Af Amer: >90 mL/min (ref >90)
GFR calc non Af Amer: >90 mL/min (ref >90)
Glucose, Bld: 120 mg/dL — ABNORMAL HIGH (ref 70–99)
POTASSIUM: 3.4 mmol/L — AB (ref 3.5–5.1)
Sodium: 132 mmol/L — ABNORMAL LOW (ref 135–145)
TOTAL PROTEIN: 8.1 g/dL (ref 6.0–8.3)
Total Bilirubin: 0.5 mg/dL (ref 0.3–1.2)

## 2015-01-09 LAB — URINALYSIS, ROUTINE W REFLEX MICROSCOPIC
GLUCOSE, UA: NEGATIVE mg/dL
HGB URINE DIPSTICK: NEGATIVE
Ketones, ur: 15 mg/dL — AB
LEUKOCYTES UA: NEGATIVE
Nitrite: NEGATIVE
Protein, ur: 100 mg/dL — AB
Specific Gravity, Urine: 1.041 — ABNORMAL HIGH (ref 1.005–1.030)
Urobilinogen, UA: 1 mg/dL (ref 0.0–1.0)
pH: 5.5 (ref 5.0–8.0)

## 2015-01-09 LAB — URINE MICROSCOPIC-ADD ON

## 2015-01-09 LAB — LIPASE, BLOOD: Lipase: 50 U/L (ref 11–59)

## 2015-01-09 MED ORDER — METOCLOPRAMIDE HCL 5 MG/ML IJ SOLN
10.0000 mg | Freq: Once | INTRAMUSCULAR | Status: AC
  Administered 2015-01-09: 10 mg via INTRAVENOUS
  Filled 2015-01-09: qty 2

## 2015-01-09 MED ORDER — SODIUM CHLORIDE 0.9 % IV BOLUS (SEPSIS)
1000.0000 mL | Freq: Once | INTRAVENOUS | Status: AC
  Administered 2015-01-10: 1000 mL via INTRAVENOUS

## 2015-01-09 MED ORDER — KETOROLAC TROMETHAMINE 15 MG/ML IJ SOLN
15.0000 mg | Freq: Once | INTRAMUSCULAR | Status: AC
  Administered 2015-01-09: 15 mg via INTRAVENOUS
  Filled 2015-01-09: qty 1

## 2015-01-09 MED ORDER — DIPHENHYDRAMINE HCL 50 MG/ML IJ SOLN
25.0000 mg | Freq: Once | INTRAMUSCULAR | Status: AC
  Administered 2015-01-09: 25 mg via INTRAVENOUS
  Filled 2015-01-09: qty 1

## 2015-01-09 MED ORDER — SODIUM CHLORIDE 0.9 % IV BOLUS (SEPSIS)
1000.0000 mL | Freq: Once | INTRAVENOUS | Status: AC
  Administered 2015-01-09: 1000 mL via INTRAVENOUS

## 2015-01-09 NOTE — ED Notes (Signed)
Pt reports 2 day history of headache, nausea, vomiting, diarrhea, generalized weakness.

## 2015-01-09 NOTE — ED Provider Notes (Signed)
CSN: [id number]     Arrival date & time 01/09/15  2229 History  This chart was scribed for Hanley SeamenJohn L Adom Schoeneck, MD by Tonye RoyaltyJoshua Chen, ED Scribe. This patient was seen in room MH06/MH06 and the patient's care was started at 11:01 PM.    Chief Complaint  Patient presents with  . Headache   The history is provided by the patient. No language interpreter was used.    HPI Comments: Debra Carpenter is a 49 y.o. female who presents to the Emergency Department complaining of headache with onset yesterday, continuing on waking this morning. She rates headache at 10/10. She reports subsequent onset of abdominal pain, nausea, diarrhea and subjective fever. She characterizes her abdominal pain as cramping in nature and in her epigastrium. She states she has been unable to tolerate solids or fluids. She states she has not been able to take any medication because she cannot keep them down. She states prior lap-band was removed because it was causing problems. She states she did not have a bypass after. She states she has maintained her weight since then. She denies chest pain, SOB or dysuria.  Past Medical History  Diagnosis Date  . GERD (gastroesophageal reflux disease)   . Hypertension   . Anxiety and depression   . Lung nodule 08/24/2011  . Bony sclerosis 08/22/2011  . Heart murmur   . Anginal pain 9/12    states negative cardiology workup exception of murmur  . Anxiety   . Depression    Past Surgical History  Procedure Laterality Date  . Laparoscopic gastric banding  2011  . Abdominal hysterectomy  2007  . Laparoscopic repair and removal of gastric band N/A 05/05/2013    Procedure: LAPAROSCOPIC REMOVAL OF ADJUSTABLE GASTRIC BAND AND PORT, ENDOSCOPY ;  Surgeon: Lodema PilotBrian Layton, DO;  Location: [facility];  Service: General;  Laterality: N/A;  laparoscopic removal of adjustable gastric band AND PORT   . Laparoscopic gastric sleeve resection N/A 07/21/2013    Procedure: LAPAROSCOPIC GASTRIC SLEEVE RESECTION, LYSIS OF  ADHESIONS, EGD;  Surgeon: Lodema PilotBrian Layton, DO;  Location: [facility];  Service: General;  Laterality: N/A;  . Upper gi endoscopy  07/21/2013    Procedure: UPPER GI ENDOSCOPY;  Surgeon: Lodema PilotBrian Layton, DO;  Location: [facility];  Service: General;;  . Laparoscopic lysis of adhesions  07/21/2013    Procedure: LAPAROSCOPIC LYSIS OF ADHESIONS;  Surgeon: Lodema PilotBrian Layton, DO;  Location: [facility];  Service: General;;   Family History  Problem Relation Age of Onset  . Hypertension Mother   . Hypertension Father   . Cancer Father     ? colon cancer  . Cancer Maternal Grandmother     ovarian cancer  . Hypertension Maternal Grandfather   . Hypertension Paternal Grandfather    History  Substance Use Topics  . Smoking status: Never Smoker   . Smokeless tobacco: Never Used  . Alcohol Use: No   OB History    No data available     Review of Systems A complete 10 system review of systems was obtained and all systems are negative except as noted in the HPI and PMH.    Allergies  Adhesive  Home Medications   Prior to Admission medications   Medication Sig Start Date End Date Taking? Authorizing Provider  amoxicillin (AMOXIL) 500 MG capsule Take 1 capsule (500 mg total) by mouth 3 (three) times daily. 06/17/14   April K Palumbo-Rasch, MD  cetirizine (ZYRTEC) 10 MG tablet Take 10 mg by mouth daily.  Historical Provider, MD  naproxen (NAPROSYN) 500 MG tablet Take 1 tablet (500 mg total) by mouth 2 (two) times daily. 06/17/14   April K Palumbo-Rasch, MD  ondansetron (ZOFRAN ODT) 4 MG disintegrating tablet Take 1 tablet (4 mg total) by mouth every 8 (eight) hours as needed for nausea. 07/23/13   Lodema Pilot, DO  prochlorperazine (COMPAZINE) 25 MG suppository Place 1 suppository (25 mg total) rectally every 12 (twelve) hours as needed. 06/17/14   April K Palumbo-Rasch, MD   BP 166/93 mmHg  Pulse 71  Temp(Src) 99.1 F (37.3 C) (Oral)  Resp 20  Ht  (1.753 m)  Wt 225 lb (102.059 kg)  BMI 33.21 kg/m2  SpO2  100%   Physical Exam  Nursing note and vitals reviewed. General: Well-developed, well-nourished female in no acute distress; appearance consistent with age of record HENT: normocephalic; atraumatic Eyes: pupils equal, round and reactive to light; extraocular muscles intact Neck: supple Heart: regular rate and rhythm; no murmur appreciated Lungs: clear to auscultation bilaterally Abdomen: soft; nondistended; no masses or hepatosplenomegaly; bowel sounds present; mild epigastric tenderness Extremities: No deformity; full range of motion; pulses normal Neurologic: Awake, alert and oriented; motor function intact in all extremities and symmetric; no facial droop Skin: Warm and dry Psychiatric: Normal mood and affect   ED Course  Procedures (including critical care time)  DIAGNOSTIC STUDIES: Oxygen Saturation is 100% on room air, normal by my interpretation.    COORDINATION OF CARE: 11:05 PM Discussed treatment plan with patient at beside, including IV fluids, pain medication and nausea medication. The patient agrees with the plan and has no further questions at this time.    MDM   Nursing notes and vitals signs, including pulse oximetry, reviewed.  Summary of this visit's results, reviewed by myself:  Labs:  Results for orders placed or performed during the hospital encounter of 01/09/15 (from the past 24 hour(s))  CBC with Differential/Platelet     Status: Abnormal   Collection Time: 01/09/15 11:00 PM  Result Value Ref Range   WBC 2.8 (L) 4.0 - 10.5 K/uL   RBC 4.99 3.87 - 5.11 MIL/uL   Hemoglobin 14.2 12.0 - 15.0 g/dL   HCT 16.1 09.6 - 04.5 %   MCV 82.4 78.0 - 100.0 fL   MCH 28.5 26.0 - 34.0 pg   MCHC 34.5 30.0 - 36.0 g/dL   RDW 40.9 81.1 - 91.4 %   Platelets 261 150 - 400 K/uL   Neutrophils Relative % 67 43 - 77 %   Neutro Abs 1.9 1.7 - 7.7 K/uL   Lymphocytes Relative 15 12 - 46 %   Lymphs Abs 0.4 (L) 0.7 - 4.0 K/uL   Monocytes Relative 18 (H) 3 - 12 %   Monocytes  Absolute 0.5 0.1 - 1.0 K/uL   Eosinophils Relative 0 0 - 5 %   Eosinophils Absolute 0.0 0.0 - 0.7 K/uL   Basophils Relative 0 0 - 1 %   Basophils Absolute 0.0 0.0 - 0.1 K/uL  Comprehensive metabolic panel     Status: Abnormal   Collection Time: 01/09/15 11:00 PM  Result Value Ref Range   Sodium 132 (L) 135 - 145 mmol/L   Potassium 3.4 (L) 3.5 - 5.1 mmol/L   Chloride 103 96 - 112 mmol/L   CO2 24 19 - 32 mmol/L   Glucose, Bld 120 (H) 70 - 99 mg/dL   BUN 11 6 - 23 mg/dL   Creatinine, Ser 7.82 0.50 - 1.10 mg/dL  Calcium 8.6 8.4 - 10.5 mg/dL   Total Protein 8.1 6.0 - 8.3 g/dL   Albumin 4.0 3.5 - 5.2 g/dL   AST 26 0 - 37 U/L   ALT 19 0 - 35 U/L   Alkaline Phosphatase 99 39 - 117 U/L   Total Bilirubin 0.5 0.3 - 1.2 mg/dL   GFR calc non Af Amer >90 >90 mL/min   GFR calc Af Amer >90 >90 mL/min   Anion gap 5 5 - 15  Lipase, blood     Status: None   Collection Time: 01/09/15 11:00 PM  Result Value Ref Range   Lipase 50 11 - 59 U/L  Urinalysis, Routine w reflex microscopic     Status: Abnormal   Collection Time: 01/09/15 11:13 PM  Result Value Ref Range   Color, Urine AMBER (A) YELLOW   APPearance CLOUDY (A) CLEAR   Specific Gravity, Urine 1.041 (H) 1.005 - 1.030   pH 5.5 5.0 - 8.0   Glucose, UA NEGATIVE NEGATIVE mg/dL   Hgb urine dipstick NEGATIVE NEGATIVE   Bilirubin Urine SMALL (A) NEGATIVE   Ketones, ur 15 (A) NEGATIVE mg/dL   Protein, ur 409 (A) NEGATIVE mg/dL   Urobilinogen, UA 1.0 0.0 - 1.0 mg/dL   Nitrite NEGATIVE NEGATIVE   Leukocytes, UA NEGATIVE NEGATIVE  Urine microscopic-add on     Status: Abnormal   Collection Time: 01/09/15 11:13 PM  Result Value Ref Range   Squamous Epithelial / LPF FEW (A) RARE   WBC, UA 0-2 <3 WBC/hpf   RBC / HPF 0-2 <3 RBC/hpf   Bacteria, UA FEW (A) RARE   Urine-Other MUCOUS PRESENT    1:29 AM Patient feeling much better after 2 liters of normal saline IV and medications. She is drinking fluids without emesis. She was advised of her  lymphopenia and need to have this followed up per PCP. The most likely cause lymphopenia in this setting would be an acute viral infection but other etiologies cannot be ruled out at this time.  I personally performed the services described in this documentation, which was scribed in my presence. The recorded information has been reviewed and is accurate.   Hanley Seamen, MD 01/10/15 [phone]

## 2015-01-10 MED ORDER — ONDANSETRON 8 MG PO TBDP: 8.0000 mg | ORAL_TABLET | Freq: Three times a day (TID) | ORAL | Status: DC | PRN

## 2015-04-13 ENCOUNTER — Encounter (HOSPITAL_BASED_OUTPATIENT_CLINIC_OR_DEPARTMENT_OTHER): Payer: Self-pay

## 2015-04-13 ENCOUNTER — Emergency Department (HOSPITAL_BASED_OUTPATIENT_CLINIC_OR_DEPARTMENT_OTHER): Payer: BLUE CROSS/BLUE SHIELD

## 2015-04-13 ENCOUNTER — Emergency Department (HOSPITAL_BASED_OUTPATIENT_CLINIC_OR_DEPARTMENT_OTHER)
Admission: EM | Admit: 2015-04-13 | Discharge: 2015-04-13 | Disposition: A | Discharge status: Home / Self Care | Payer: BLUE CROSS/BLUE SHIELD | Attending: Emergency Medicine | Admitting: Emergency Medicine

## 2015-04-13 DIAGNOSIS — I10 Essential (primary) hypertension: Secondary | ICD-10-CM | POA: Diagnosis not present

## 2015-04-13 DIAGNOSIS — R42 Dizziness and giddiness: Secondary | ICD-10-CM | POA: Diagnosis present

## 2015-04-13 DIAGNOSIS — Z8659 Personal history of other mental and behavioral disorders: Secondary | ICD-10-CM | POA: Diagnosis not present

## 2015-04-13 DIAGNOSIS — Z8719 Personal history of other diseases of the digestive system: Secondary | ICD-10-CM | POA: Diagnosis not present

## 2015-04-13 DIAGNOSIS — R011 Cardiac murmur, unspecified: Secondary | ICD-10-CM | POA: Diagnosis not present

## 2015-04-13 DIAGNOSIS — H81399 Other peripheral vertigo, unspecified ear: Secondary | ICD-10-CM | POA: Insufficient documentation

## 2015-04-13 DIAGNOSIS — Q782 Osteopetrosis: Secondary | ICD-10-CM | POA: Diagnosis not present

## 2015-04-13 LAB — CBC WITH DIFFERENTIAL/PLATELET
BASOS ABS: 0 10*3/uL (ref 0.0–0.1)
BASOS PCT: 0 % (ref 0–1)
Eosinophils Absolute: 0.1 10*3/uL (ref 0.0–0.7)
Eosinophils Relative: 3 % (ref 0–5)
HEMATOCRIT: 41.7 % (ref 36.0–46.0)
HEMOGLOBIN: 14.1 g/dL (ref 12.0–15.0)
Lymphocytes Relative: 46 % (ref 12–46)
Lymphs Abs: 2.4 10*3/uL (ref 0.7–4.0)
MCH: 28.6 pg (ref 26.0–34.0)
MCHC: 33.8 g/dL (ref 30.0–36.0)
MCV: 84.6 fL (ref 78.0–100.0)
Monocytes Absolute: 0.8 10*3/uL (ref 0.1–1.0)
Monocytes Relative: 16 % — ABNORMAL HIGH (ref 3–12)
Neutro Abs: 1.8 10*3/uL (ref 1.7–7.7)
Neutrophils Relative %: 35 % — ABNORMAL LOW (ref 43–77)
PLATELETS: 268 10*3/uL (ref 150–400)
RBC: 4.93 MIL/uL (ref 3.87–5.11)
RDW: 12.4 % (ref 11.5–15.5)
WBC: 5.1 10*3/uL (ref 4.0–10.5)

## 2015-04-13 LAB — BASIC METABOLIC PANEL
Anion gap: 12 (ref 5–15)
BUN: 11 mg/dL (ref 6–20)
CHLORIDE: 99 mmol/L — AB (ref 101–111)
CO2: 28 mmol/L (ref 22–32)
CREATININE: 0.58 mg/dL (ref 0.44–1.00)
Calcium: 9.6 mg/dL (ref 8.9–10.3)
GFR calc Af Amer: >60 mL/min (ref >60)
GFR calc non Af Amer: >60 mL/min (ref >60)
GLUCOSE: 108 mg/dL — AB (ref 65–99)
Potassium: 3.4 mmol/L — ABNORMAL LOW (ref 3.5–5.1)
Sodium: 139 mmol/L (ref 135–145)

## 2015-04-13 LAB — TROPONIN I: Troponin I: <0.03 ng/mL (ref <0.031)

## 2015-04-13 MED ORDER — MECLIZINE HCL 25 MG PO TABS
25.0000 mg | ORAL_TABLET | Freq: Once | ORAL | Status: AC
  Administered 2015-04-13: 25 mg via ORAL
  Filled 2015-04-13: qty 1

## 2015-04-13 MED ORDER — ONDANSETRON HCL 4 MG/2ML IJ SOLN
4.0000 mg | Freq: Once | INTRAMUSCULAR | Status: AC
  Administered 2015-04-13: 4 mg via INTRAVENOUS
  Filled 2015-04-13: qty 2

## 2015-04-13 MED ORDER — LOSARTAN POTASSIUM 50 MG PO TABS: 50.0000 mg | ORAL_TABLET | Freq: Every day | ORAL | Status: DC

## 2015-04-13 MED ORDER — MECLIZINE HCL 25 MG PO TABS: 25.0000 mg | ORAL_TABLET | Freq: Three times a day (TID) | ORAL | Status: DC | PRN

## 2015-04-13 MED ORDER — DIAZEPAM 5 MG/ML IJ SOLN
2.5000 mg | Freq: Once | INTRAMUSCULAR | Status: AC
  Administered 2015-04-13: 2.5 mg via INTRAVENOUS
  Filled 2015-04-13: qty 2

## 2015-04-13 NOTE — Discharge Instructions (Signed)
Meclizine as prescribed as needed for dizziness. ° °Return to the emergency department if your symptoms significantly worsen or change. ° ° °Vertigo °Vertigo means you feel like you or your surroundings are moving when they are not. Vertigo can be dangerous if it occurs when you are at work, driving, or performing difficult activities.  °CAUSES  °Vertigo occurs when there is a conflict of signals sent to your brain from the visual and sensory systems in your body. There are many different causes of vertigo, including: °· Infections, especially in the inner ear. °· A bad reaction to a drug or misuse of alcohol and medicines. °· Withdrawal from drugs or alcohol. °· Rapidly changing positions, such as lying down or rolling over in bed. °· A migraine headache. °· Decreased blood flow to the brain. °· Increased pressure in the brain from a head injury, infection, tumor, or bleeding. °SYMPTOMS  °You may feel as though the world is spinning around or you are falling to the ground. Because your balance is upset, vertigo can cause nausea and vomiting. You may have involuntary eye movements (nystagmus). °DIAGNOSIS  °Vertigo is usually diagnosed by physical exam. If the cause of your vertigo is unknown, your caregiver may perform imaging tests, such as an MRI scan (magnetic resonance imaging). °TREATMENT  °Most cases of vertigo resolve on their own, without treatment. Depending on the cause, your caregiver may prescribe certain medicines. If your vertigo is related to body position issues, your caregiver may recommend movements or procedures to correct the problem. In rare cases, if your vertigo is caused by certain inner ear problems, you may need surgery. °HOME CARE INSTRUCTIONS  °· Follow your caregiver's instructions. °· Avoid driving. °· Avoid operating heavy machinery. °· Avoid performing any tasks that would be dangerous to you or others during a vertigo episode. °· Tell your caregiver if you notice that certain  medicines seem to be causing your vertigo. Some of the medicines used to treat vertigo episodes can actually make them worse in some people. °SEEK IMMEDIATE MEDICAL CARE IF:  °· Your medicines do not relieve your vertigo or are making it worse. °· You develop problems with talking, walking, weakness, or using your arms, hands, or legs. °· You develop severe headaches. °· Your nausea or vomiting continues or gets worse. °· You develop visual changes. °· A family member notices behavioral changes. °· Your condition gets worse. °MAKE SURE YOU: °· Understand these instructions. °· Will watch your condition. °· Will get help right away if you are not doing well or get worse. °Document Released: 08/22/2005 Document Revised: 02/04/2012 Document Reviewed: 05/31/2011 °[employer]® Patient Information ©2015 [employer]. This information is not intended to replace advice given to you by your health care provider. Make sure you discuss any questions you have with your health care provider. ° °

## 2015-04-13 NOTE — ED Provider Notes (Signed)
Care assumed from Dr. Read DriversMolpus at shift change. Patient presents here with complaints of dizziness that she describes as a spinning sensation that is worse with turning her head and changing position. Her workup reveals no acute abnormality, including head CT, troponin 2, EKG, and electrolytes. She was given medications in the ER and is feeling somewhat better. Her symptoms appear to be related to a peripheral vertigo. This will be treated with when necessary meclizine and as needed return.  Geoffery Lyonsouglas Fernand Sorbello, MD 04/13/15 [phone]

## 2015-04-13 NOTE — ED Provider Notes (Signed)
CSN: [id number]     Arrival date & time 04/13/15  [id number] History   First MD Initiated Contact with Patient 04/13/15 0630     Chief Complaint  Patient presents with  . Dizziness     (Consider location/radiation/quality/duration/timing/severity/associated sxs/prior Treatment) HPI  This is a 49 year old female with a history of hypertension. She awoke about 3 AM today with dizziness. She describes the dizziness as the sense that the room is spinning. Symptoms are worse when she stands or moves her head. She has had vertigo in the past but never this severe. Associated symptoms include nausea and headache. The headache is generalized and dull. About 30 minutes prior to arrival she had several episodes of chest pain. The chest pain was a sharp, stabbing pain to the left of her sternum. The episodes lasted about a minute each. These occurred for about 5 minutes and abated. She felt short of breath at that time but not currently now. She states that her blood pressure has been elevated the past few days. She was previously on blood pressure medication but not currently.  Past Medical History  Diagnosis Date  . GERD (gastroesophageal reflux disease)   . Hypertension   . Anxiety and depression   . Lung nodule 08/24/2011  . Bony sclerosis 08/22/2011  . Heart murmur   . Anginal pain 9/12    states negative cardiology workup exception of murmur  . Anxiety   . Depression    Past Surgical History  Procedure Laterality Date  . Laparoscopic gastric banding  2011  . Abdominal hysterectomy  2007  . Laparoscopic repair and removal of gastric band N/A 05/05/2013    Procedure: LAPAROSCOPIC REMOVAL OF ADJUSTABLE GASTRIC BAND AND PORT, ENDOSCOPY ;  Surgeon: Lodema PilotBrian Layton, DO;  Location: [facility];  Service: General;  Laterality: N/A;  laparoscopic removal of adjustable gastric band AND PORT   . Laparoscopic gastric sleeve resection N/A 07/21/2013    Procedure: LAPAROSCOPIC GASTRIC SLEEVE RESECTION, LYSIS OF  ADHESIONS, EGD;  Surgeon: Lodema PilotBrian Layton, DO;  Location: [facility];  Service: General;  Laterality: N/A;  . Upper gi endoscopy  07/21/2013    Procedure: UPPER GI ENDOSCOPY;  Surgeon: Lodema PilotBrian Layton, DO;  Location: [facility];  Service: General;;  . Laparoscopic lysis of adhesions  07/21/2013    Procedure: LAPAROSCOPIC LYSIS OF ADHESIONS;  Surgeon: Lodema PilotBrian Layton, DO;  Location: [facility];  Service: General;;   Family History  Problem Relation Age of Onset  . Hypertension Mother   . Hypertension Father   . Cancer Father     ? colon cancer  . Cancer Maternal Grandmother     ovarian cancer  . Hypertension Maternal Grandfather   . Hypertension Paternal Grandfather    History  Substance Use Topics  . Smoking status: Never Smoker   . Smokeless tobacco: Never Used  . Alcohol Use: No   OB History    No data available     Review of Systems  All other systems reviewed and are negative.   Allergies  Adhesive  Home Medications   Prior to Admission medications   Medication Sig Start Date End Date Taking? Authorizing Provider  ondansetron (ZOFRAN ODT) 8 MG disintegrating tablet Take 1 tablet (8 mg total) by mouth every 8 (eight) hours as needed for nausea or vomiting. 01/10/15   Paula LibraJohn Comfort Iversen, MD   BP 189/104 mmHg  Pulse 68  Temp(Src) 97.8 F (36.6 C) (Oral)  Resp 16  Ht 5\' 9"  (1.753 m)  Wt 227 lb (  102.967 kg)  BMI 33.51 kg/m2  SpO2 100%   Physical Exam  General: Well-developed, well-nourished female in no acute distress; appearance consistent with age of record HENT: normocephalic; atraumatic Eyes: pupils equal, round and reactive to light; extraocular muscles intact; no nystagmus Neck: supple Heart: regular rate and rhythm; no murmur appreciated; occasional ectopy Lungs: clear to auscultation bilaterally Abdomen: soft; nondistended; nontender; no masses or hepatosplenomegaly; bowel sounds present Extremities: No deformity; full range of motion; pulses normal Neurologic: Awake, alert and  oriented; motor function intact in all extremities and symmetric; no facial droop Skin: Warm and dry Psychiatric: Flat affect    ED Course  Procedures (including critical care time)   MDM   EKG Interpretation:  Date & Time: 04/13/2015 6:19 AM  Rate: 66  Rhythm: normal sinus rhythm  QRS Axis: normal  Intervals: normal  ST/T Wave abnormalities: nonspecific T wave changes  Conduction Disutrbances:none  Narrative Interpretation:   Old EKG Reviewed: No significant change   Nursing notes and vitals signs, including pulse oximetry, reviewed.  Summary of this visit's results, reviewed by myself:  Labs:  Results for orders placed or performed during the hospital encounter of 04/13/15 (from the past 24 hour(s))  CBC with Differential     Status: Abnormal   Collection Time: 04/13/15  6:30 AM  Result Value Ref Range   WBC 5.1 4.0 - 10.5 K/uL   RBC 4.93 3.87 - 5.11 MIL/uL   Hemoglobin 14.1 12.0 - 15.0 g/dL   HCT 66.041.7 63.036.0 - 16.046.0 %   MCV 84.6 78.0 - 100.0 fL   MCH 28.6 26.0 - 34.0 pg   MCHC 33.8 30.0 - 36.0 g/dL   RDW 10.912.4 32.311.5 - 55.715.5 %   Platelets 268 150 - 400 K/uL   Neutrophils Relative % 35 (L) 43 - 77 %   Neutro Abs 1.8 1.7 - 7.7 K/uL   Lymphocytes Relative 46 12 - 46 %   Lymphs Abs 2.4 0.7 - 4.0 K/uL   Monocytes Relative 16 (H) 3 - 12 %   Monocytes Absolute 0.8 0.1 - 1.0 K/uL   Eosinophils Relative 3 0 - 5 %   Eosinophils Absolute 0.1 0.0 - 0.7 K/uL   Basophils Relative 0 0 - 1 %   Basophils Absolute 0.0 0.0 - 0.1 K/uL  Basic metabolic panel     Status: Abnormal   Collection Time: 04/13/15  6:30 AM  Result Value Ref Range   Sodium 139 135 - 145 mmol/L   Potassium 3.4 (L) 3.5 - 5.1 mmol/L   Chloride 99 (L) 101 - 111 mmol/L   CO2 28 22 - 32 mmol/L   Glucose, Bld 108 (H) 65 - 99 mg/dL   BUN 11 6 - 20 mg/dL   Creatinine, Ser 3.220.58 0.44 - 1.00 mg/dL   Calcium 9.6 8.9 - 02.510.3 mg/dL   GFR calc non Af Amer >60 >60 mL/min   GFR calc Af Amer >60 >60 mL/min   Anion gap 12  5 - 15  Troponin I     Status: None   Collection Time: 04/13/15  6:30 AM  Result Value Ref Range   Troponin I <0.03 <0.031 ng/mL   7:19 AM Patient feeling somewhat better after IV Valium and Zofran. Dr. Judd Lienelo will follow up and make disposition.     Paula LibraJohn Jordon Kristiansen, MD 04/13/15 [phone]

## 2015-04-13 NOTE — ED Notes (Signed)
Pt c/o waking up at 0300am with a headahce, c/o chest pain x8015mins with dizziness, headache, nausea

## 2016-09-04 ENCOUNTER — Emergency Department (HOSPITAL_BASED_OUTPATIENT_CLINIC_OR_DEPARTMENT_OTHER)
Admission: EM | Admit: 2016-09-04 | Discharge: 2016-09-05 | Disposition: A | Discharge status: Home / Self Care | Payer: BLUE CROSS/BLUE SHIELD | Attending: Emergency Medicine | Admitting: Emergency Medicine

## 2016-09-04 ENCOUNTER — Encounter (HOSPITAL_BASED_OUTPATIENT_CLINIC_OR_DEPARTMENT_OTHER): Payer: Self-pay

## 2016-09-04 DIAGNOSIS — Y999 Unspecified external cause status: Secondary | ICD-10-CM | POA: Insufficient documentation

## 2016-09-04 DIAGNOSIS — Y92410 Unspecified street and highway as the place of occurrence of the external cause: Secondary | ICD-10-CM | POA: Diagnosis not present

## 2016-09-04 DIAGNOSIS — Z79899 Other long term (current) drug therapy: Secondary | ICD-10-CM | POA: Diagnosis not present

## 2016-09-04 DIAGNOSIS — S161XXA Strain of muscle, fascia and tendon at neck level, initial encounter: Secondary | ICD-10-CM | POA: Insufficient documentation

## 2016-09-04 DIAGNOSIS — S199XXA Unspecified injury of neck, initial encounter: Secondary | ICD-10-CM | POA: Diagnosis present

## 2016-09-04 DIAGNOSIS — I10 Essential (primary) hypertension: Secondary | ICD-10-CM | POA: Diagnosis not present

## 2016-09-04 DIAGNOSIS — Y9389 Activity, other specified: Secondary | ICD-10-CM | POA: Insufficient documentation

## 2016-09-04 DIAGNOSIS — M545 Low back pain: Secondary | ICD-10-CM | POA: Insufficient documentation
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
## 2016-09-05 DIAGNOSIS — S161XXA Strain of muscle, fascia and tendon at neck level, initial encounter: Secondary | ICD-10-CM | POA: Diagnosis not present

## 2016-09-05 MED ORDER — IBUPROFEN 800 MG PO TABS
800.0000 mg | ORAL_TABLET | Freq: Once | ORAL | Status: AC
  Administered 2016-09-05: 800 mg via ORAL
  Filled 2016-09-05: qty 1

## 2016-09-05 MED ORDER — METHOCARBAMOL 500 MG PO TABS: 500.0000 mg | ORAL_TABLET | Freq: Two times a day (BID) | ORAL | 0 refills | Status: DC

## 2016-09-05 MED ORDER — IBUPROFEN 800 MG PO TABS: 800.0000 mg | ORAL_TABLET | Freq: Three times a day (TID) | ORAL | 0 refills | Status: DC

## 2016-09-05 NOTE — Discharge Instructions (Signed)
Return if any problems.

## 2016-10-15 ENCOUNTER — Encounter (HOSPITAL_COMMUNITY): Payer: Self-pay

## 2017-10-15 ENCOUNTER — Ambulatory Visit (HOSPITAL_BASED_OUTPATIENT_CLINIC_OR_DEPARTMENT_OTHER)
Admission: RE | Admit: 2017-10-15 | Discharge: 2017-10-15 | Disposition: A | Discharge status: Home / Self Care | Payer: BLUE CROSS/BLUE SHIELD | Source: Ambulatory Visit | Attending: Family Medicine | Admitting: Family Medicine

## 2017-10-15 ENCOUNTER — Other Ambulatory Visit (HOSPITAL_BASED_OUTPATIENT_CLINIC_OR_DEPARTMENT_OTHER): Payer: Self-pay | Admitting: Family Medicine

## 2017-10-15 DIAGNOSIS — R42 Dizziness and giddiness: Secondary | ICD-10-CM | POA: Diagnosis not present

## 2017-10-15 DIAGNOSIS — R51 Headache: Secondary | ICD-10-CM | POA: Diagnosis not present

## 2017-10-15 DIAGNOSIS — R410 Disorientation, unspecified: Secondary | ICD-10-CM

## 2018-05-08 ENCOUNTER — Ambulatory Visit: Payer: BLUE CROSS/BLUE SHIELD | Admitting: Family Medicine

## 2018-05-08 DIAGNOSIS — M1711 Unilateral primary osteoarthritis, right knee: Secondary | ICD-10-CM

## 2018-05-08 NOTE — Progress Notes (Signed)
[name] [address] [place], [place] [address] Phone: [phone] Subjective:     CC: Right knee pain  [id number]  Debra Carpenter is a 52 y.o. female coming in with complaint of right knee pain.  Patient has had it for quite some time.  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  Has noticed more discomfort recently.  Patient states that it is stopped her from running.  Pain seems to be on the lateral aspect of the anterior aspect of the knee.  Rates the severity pain is 6 out of 10.  Sometimes associated with some swelling.  Does not remember any specific injury though.  Seems to be hurting her for at least greater than 2 months.     Past Medical History:  Diagnosis Date  . Anginal pain (HCC) 9/12   states negative cardiology workup exception of murmur  . Anxiety   . Anxiety and depression   . Bony sclerosis 08/22/2011  . Depression   . GERD (gastroesophageal reflux disease)   . Heart murmur   . Hypertension   . Lung nodule 08/24/2011   Past Surgical History:  Procedure Laterality Date  . ABDOMINAL HYSTERECTOMY  2007  . LAPAROSCOPIC GASTRIC BANDING  2011  . LAPAROSCOPIC GASTRIC SLEEVE RESECTION N/A 07/21/2013   Procedure: LAPAROSCOPIC GASTRIC SLEEVE RESECTION, LYSIS OF ADHESIONS, EGD;  Surgeon: Lodema Pilot, DO;  Location: [facility];  Service: General;  Laterality: N/A;  . LAPAROSCOPIC LYSIS OF ADHESIONS  07/21/2013   Procedure: LAPAROSCOPIC LYSIS OF ADHESIONS;  Surgeon: Lodema Pilot, DO;  Location: [facility];  Service: General;;  . LAPAROSCOPIC REPAIR AND REMOVAL OF GASTRIC BAND N/A 05/05/2013   Procedure: LAPAROSCOPIC REMOVAL OF ADJUSTABLE GASTRIC BAND AND PORT, ENDOSCOPY ;  Surgeon: Lodema Pilot, DO;  Location: [facility];  Service: General;  Laterality: N/A;  laparoscopic removal of adjustable gastric band AND PORT   . UPPER GI ENDOSCOPY  07/21/2013   Procedure: UPPER GI ENDOSCOPY;  Surgeon: Lodema Pilot, DO;  Location: [facility];  Service: General;;   Social  History   Socioeconomic History  . Marital status: Married    Spouse name: Not on file  . Number of children: 3  . Years of education: Not on file  . Highest education level: Not on file  Occupational History    Employer: [employer]  Social Needs  . Financial resource strain: Not on file  . Food insecurity:    Worry: Not on file    Inability: Not on file  . Transportation needs:    Medical: Not on file    Non-medical: Not on file  Tobacco Use  . Smoking status: Never Smoker  . Smokeless tobacco: Never Used  Substance and Sexual Activity  . Alcohol use: No  . Drug use: No  . Sexual activity: Not Currently    Birth control/protection: Surgical  Lifestyle  . Physical activity:    Days per week: Not on file    Minutes per session: Not on file  . Stress: Not on file  Relationships  . Social connections:    Talks on phone: Not on file    Gets together: Not on file    Attends religious service: Not on file    Active member of club or organization: Not on file    Attends meetings of clubs or organizations: Not on file    Relationship status: Not on file  Other Topics Concern  . 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     3 children- [id number] daughter, [id number] son, [id number] daughter               Allergies  Allergen Reactions  . Adhesive [Tape] Hives   Family History  Problem Relation Age of Onset  . Hypertension Mother   . Hypertension Father   . Cancer Father        ? colon cancer  . Cancer Maternal Grandmother        ovarian cancer  . Hypertension Maternal Grandfather   . Hypertension Paternal Grandfather      Past medical history, social, surgical and family history all reviewed in electronic medical record.  No pertanent information unless stated regarding to the chief complaint.   Review of Systems:Review of systems updated and as accurate as  of 05/08/18  No headache, visual changes, nausea, vomiting, diarrhea, constipation, dizziness, abdominal pain, skin rash, fevers, chills, night sweats, weight loss, swollen lymph nodes, body aches,  chest pain, shortness of breath, mood changes.  Positive muscle aches, joint swelling  Objective Systems examined below as of 05/08/18   General: No apparent distress alert and oriented x3 mood and affect normal, dressed appropriately.  HEENT: Pupils equal, extraocular movements intact  Respiratory: Patient's speak in full sentences and does not appear short of breath  Cardiovascular: No lower extremity edema, non tender, no erythema  Skin: Warm dry intact with no signs of infection or rash on extremities or on axial skeleton.  Abdomen: Soft nontender  Neuro: Cranial nerves II through XII are intact, neurovascularly intact in all extremities with 2+ DTRs and 2+ pulses.  Lymph: No lymphadenopathy of posterior or anterior cervical chain or axillae bilaterally.  Gait mild antalgic MSK:  Non tender with full range of motion and good stability and symmetric strength and tone of shoulders, elbows, wrist, hip, and ankles bilaterally.  Knee: right  Varus deformity noted. Large thigh to calf ratio.  Tender to palpation over medial and PF joint line.  ROM full in flexion and extension and lower leg rotation. instability with valgus force.  painful patellar compression. Patellar glide with moderate crepitus. Patellar and quadriceps tendons unremarkable. Hamstring and quadriceps strength is normal. Contralateral knee shows mild arthritic changes but no tenderness and no instability  After informed written and verbal consent, patient was seated on exam table. Right knee was prepped with alcohol swab and utilizing anterolateral approach, patient's right knee space was injected with 4:1  marcaine 0.5%: Kenalog 40mg /dL. Patient tolerated the procedure well without immediate complications.    Impression and  Recommendations:     This case required medical decision making of moderate complexity.      Note: This dictation was prepared with Dragon dictation along with smaller phrase technology. Any transcriptional errors that result from this process are unintentional.

## 2018-05-08 NOTE — Assessment & Plan Note (Signed)
Patient given injection today.  Tolerated procedure well.  Discussed icing regimen and home exercises.  Topical anti-inflammatories given.  Discussed over-the-counter medications.  Discussed certain activities such as low impact exercise that may be better for her..  Follow-up again in 4 weeks

## 2018-05-08 NOTE — Patient Instructions (Signed)
Good to see you.  Ice 20 minutes 2 times daily. Usually after activity and before bed. Exercises 3 times a week. pennsaid pinkie amount topically 2 times daily as needed.  Vitamin D 2000 IU daily  Turmeric 500mg  daily  Stay active but consider biking, elliptical or swimming a little more See me again in 4 weeks

## 2018-06-08 NOTE — Progress Notes (Deleted)
[facility] [address] [place], [place] [address] Phone: [phone] Subjective:    I'm seeing this patient by the request  of:    CC:   [id number]  Debra Carpenter is a 52 y.o. female coming in with complaint of ***  Onset-  Location Duration-  Character- Aggravating factors- Reliving factors-  Therapies tried-  Severity-     Past Medical History:  Diagnosis Date  . Anginal pain (HCC) 9/12   states negative cardiology workup exception of murmur  . Anxiety   . Anxiety and depression   . Bony sclerosis 08/22/2011  . Depression   . GERD (gastroesophageal reflux disease)   . Heart murmur   . Hypertension   . Lung nodule 08/24/2011   Past Surgical History:  Procedure Laterality Date  . ABDOMINAL HYSTERECTOMY  2007  . LAPAROSCOPIC GASTRIC BANDING  2011  . LAPAROSCOPIC GASTRIC SLEEVE RESECTION N/A 07/21/2013   Procedure: LAPAROSCOPIC GASTRIC SLEEVE RESECTION, LYSIS OF ADHESIONS, EGD;  Surgeon: Lodema Pilot, DO;  Location: [facility];  Service: General;  Laterality: N/A;  . LAPAROSCOPIC LYSIS OF ADHESIONS  07/21/2013   Procedure: LAPAROSCOPIC LYSIS OF ADHESIONS;  Surgeon: Lodema Pilot, DO;  Location: [facility];  Service: General;;  . LAPAROSCOPIC REPAIR AND REMOVAL OF GASTRIC BAND N/A 05/05/2013   Procedure: LAPAROSCOPIC REMOVAL OF ADJUSTABLE GASTRIC BAND AND PORT, ENDOSCOPY ;  Surgeon: Lodema Pilot, DO;  Location: [facility];  Service: General;  Laterality: N/A;  laparoscopic removal of adjustable gastric band AND PORT   . UPPER GI ENDOSCOPY  07/21/2013   Procedure: UPPER GI ENDOSCOPY;  Surgeon: Lodema Pilot, DO;  Location: [facility];  Service: General;;   Social History   Socioeconomic History  . Marital status: Married    Spouse name: Not on file  . Number of children: 3  . Years of education: Not on file  . Highest education level: Not on file  Occupational History    Employer: [employer]  Social Needs  . Financial resource strain: Not  on file  . Food insecurity:    Worry: Not on file    Inability: Not on file  . Transportation needs:    Medical: Not on file    Non-medical: Not on file  Tobacco Use  . Smoking status: Never Smoker  . Smokeless tobacco: Never Used  Substance and Sexual Activity  . Alcohol use: No  . Drug use: No  . Sexual activity: Not Currently    Birth control/protection: Surgical  Lifestyle  . Physical activity:    Days per week: Not on file    Minutes per session: Not on file  . Stress: Not on file  Relationships  . Social connections:    Talks on phone: Not on file    Gets together: Not on file    Attends religious service: Not on file    Active member of club or organization: Not on file    Attends meetings of clubs or organizations: Not on file    Relationship status: Not on file  Other Topics Concern  . 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     3 children- 53 daughter, 53 son, 29 daughter               Allergies  Allergen Reactions  . Adhesive [Tape] Hives  Family History  Problem Relation Age of Onset  . Hypertension Mother   . Hypertension Father   . Cancer Father        ? colon cancer  . Cancer Maternal Grandmother        ovarian cancer  . Hypertension Maternal Grandfather   . Hypertension Paternal Grandfather      Past medical history, social, surgical and family history all reviewed in electronic medical record.  No pertanent information unless stated regarding to the chief complaint.   Review of Systems:Review of systems updated and as accurate as of 06/08/18  No headache, visual changes, nausea, vomiting, diarrhea, constipation, dizziness, abdominal pain, skin rash, fevers, chills, night sweats, weight loss, swollen lymph nodes, body aches, joint swelling, muscle aches, chest pain, shortness of breath, mood changes.   Objective  There  were no vitals taken for this visit. Systems examined below as of 06/08/18   General: No apparent distress alert and oriented x3 mood and affect normal, dressed appropriately.  HEENT: Pupils equal, extraocular movements intact  Respiratory: Patient's speak in full sentences and does not appear short of breath  Cardiovascular: No lower extremity edema, non tender, no erythema  Skin: Warm dry intact with no signs of infection or rash on extremities or on axial skeleton.  Abdomen: Soft nontender  Neuro: Cranial nerves II through XII are intact, neurovascularly intact in all extremities with 2+ DTRs and 2+ pulses.  Lymph: No lymphadenopathy of posterior or anterior cervical chain or axillae bilaterally.  Gait normal with good balance and coordination.  MSK:  Non tender with full range of motion and good stability and symmetric strength and tone of shoulders, elbows, wrist, hip, knee and ankles bilaterally.     Impression and Recommendations:     This case required medical decision making of moderate complexity.      Note: This dictation was prepared with Dragon dictation along with smaller phrase technology. Any transcriptional errors that result from this process are unintentional.

## 2018-06-09 ENCOUNTER — Ambulatory Visit: Payer: BLUE CROSS/BLUE SHIELD | Admitting: Family Medicine

## 2018-06-09 DIAGNOSIS — Z0289 Encounter for other administrative examinations: Secondary | ICD-10-CM

## 2018-08-28 NOTE — Progress Notes (Deleted)
Debra [facility] - 52 y.o. female MRN [id number]  Date of birth: [birth year]  SUBJECTIVE:  Including CC & ROS.  No chief complaint on file.   Debra Carpenter is a 52 y.o. female that is  ***.  ***   Review of Systems  HISTORY: Past Medical, Surgical, Social, and Family History Reviewed & Updated per EMR.   Pertinent Historical Findings include:  Past Medical History:  Diagnosis Date  . Anginal pain (HCC) 9/12   states negative cardiology workup exception of murmur  . Anxiety   . Anxiety and depression   . Bony sclerosis 08/22/2011  . Depression   . GERD (gastroesophageal reflux disease)   . Heart murmur   . Hypertension   . Lung nodule 08/24/2011    Past Surgical History:  Procedure Laterality Date  . ABDOMINAL HYSTERECTOMY  2007  . LAPAROSCOPIC GASTRIC BANDING  2011  . LAPAROSCOPIC GASTRIC SLEEVE RESECTION N/A 07/21/2013   Procedure: LAPAROSCOPIC GASTRIC SLEEVE RESECTION, LYSIS OF ADHESIONS, EGD;  Surgeon: Lodema Pilot, DO;  Location: [facility];  Service: General;  Laterality: N/A;  . LAPAROSCOPIC LYSIS OF ADHESIONS  07/21/2013   Procedure: LAPAROSCOPIC LYSIS OF ADHESIONS;  Surgeon: Lodema Pilot, DO;  Location: [facility];  Service: General;;  . LAPAROSCOPIC REPAIR AND REMOVAL OF GASTRIC BAND N/A 05/05/2013   Procedure: LAPAROSCOPIC REMOVAL OF ADJUSTABLE GASTRIC BAND AND PORT, ENDOSCOPY ;  Surgeon: Lodema Pilot, DO;  Location: [facility];  Service: General;  Laterality: N/A;  laparoscopic removal of adjustable gastric band AND PORT   . UPPER GI ENDOSCOPY  07/21/2013   Procedure: UPPER GI ENDOSCOPY;  Surgeon: Lodema Pilot, DO;  Location: [facility];  Service: General;;    Allergies  Allergen Reactions  . Adhesive [Tape] Hives    Family History  Problem Relation Age of Onset  . Hypertension Mother   . Hypertension Father   . Cancer Father        ? colon cancer  . Cancer Maternal Grandmother        ovarian cancer  . Hypertension Maternal Grandfather   . Hypertension Paternal Grandfather       Social History   Socioeconomic History  . Marital status: Married    Spouse name: Not on file  . Number of children: 3  . Years of education: Not on file  . Highest education level: Not on file  Occupational History    Employer: [employer]  Social Needs  . Financial resource strain: Not on file  . Food insecurity:    Worry: Not on file    Inability: Not on file  . Transportation needs:    Medical: Not on file    Non-medical: Not on file  Tobacco Use  . Smoking status: Never Smoker  . Smokeless tobacco: Never Used  Substance and Sexual Activity  . Alcohol use: No  . Drug use: No  . Sexual activity: Not Currently    Birth control/protection: Surgical  Lifestyle  . Physical activity:    Days per week: Not on file    Minutes per session: Not on file  . Stress: Not on file  Relationships  . Social connections:    Talks on phone: Not on file    Gets together: Not on file    Attends religious service: Not on file    Active member of club or organization: Not on file    Attends meetings of clubs or organizations: Not on file    Relationship status: Not on file  .  Intimate partner violence:    Fear of current or ex partner: Not on file    Emotionally abused: Not on file    Physically abused: Not on file    Forced sexual activity: Not on file  Other Topics Concern  . 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     3 children- 18 daughter, 58 son, 74 daughter                 PHYSICAL EXAM:  VS: There were no vitals taken for this visit. Physical Exam Gen: NAD, alert, cooperative with exam, well-appearing ENT: normal lips, normal nasal mucosa,  Eye: normal EOM, normal conjunctiva and lids CV:  no edema, +2 pedal pulses   Resp: no accessory muscle use, non-labored,  GI: no masses or tenderness, no hernia  Skin: no rashes, no areas of  induration  Neuro: normal tone, normal sensation to touch Psych:  normal insight, alert and oriented MSK:  ***      ASSESSMENT & PLAN:   No problem-specific Assessment & Plan notes found for this encounter.

## 2018-08-29 ENCOUNTER — Ambulatory Visit: Payer: BLUE CROSS/BLUE SHIELD | Admitting: Family Medicine

## 2018-08-29 DIAGNOSIS — Z0289 Encounter for other administrative examinations: Secondary | ICD-10-CM

## 2018-09-15 NOTE — Progress Notes (Signed)
[facility] [address] [place], [place] [address] Phone: [phone] Subjective:   [name], am serving as a scribe for Dr. Antoine Primas.   CC: Right knee pain  BJY:NWGNFAOZHY  Debra [facility] is a 52 y.o. female coming in with complaint of right knee pain. Has had an increase in pain the past 2 weeks. Is having a tightness posterior knee after sitting for prolonged periods. She stretches to alleviate her pain.  Does not remember any true pain or any injury that she  can think that is contributing to this.  Does still have known knee arthritis.  Has responded to injections previously.  Worsening instability noted as well.      Past Medical History:  Diagnosis Date  . Anginal pain (HCC) 9/12   states negative cardiology workup exception of murmur  . Anxiety   . Anxiety and depression   . Bony sclerosis 08/22/2011  . Depression   . GERD (gastroesophageal reflux disease)   . Heart murmur   . Hypertension   . Lung nodule 08/24/2011   Past Surgical History:  Procedure Laterality Date  . ABDOMINAL HYSTERECTOMY  2007  . LAPAROSCOPIC GASTRIC BANDING  2011  . LAPAROSCOPIC GASTRIC SLEEVE RESECTION N/A 07/21/2013   Procedure: LAPAROSCOPIC GASTRIC SLEEVE RESECTION, LYSIS OF ADHESIONS, EGD;  Surgeon: Lodema Pilot, DO;  Location: [facility];  Service: General;  Laterality: N/A;  . LAPAROSCOPIC LYSIS OF ADHESIONS  07/21/2013   Procedure: LAPAROSCOPIC LYSIS OF ADHESIONS;  Surgeon: Lodema Pilot, DO;  Location: [facility];  Service: General;;  . LAPAROSCOPIC REPAIR AND REMOVAL OF GASTRIC BAND N/A 05/05/2013   Procedure: LAPAROSCOPIC REMOVAL OF ADJUSTABLE GASTRIC BAND AND PORT, ENDOSCOPY ;  Surgeon: Lodema Pilot, DO;  Location: [facility];  Service: General;  Laterality: N/A;  laparoscopic removal of adjustable gastric band AND PORT   . UPPER GI ENDOSCOPY  07/21/2013   Procedure: UPPER GI ENDOSCOPY;  Surgeon: Lodema Pilot, DO;  Location: [facility];  Service: General;;   Social  History   Socioeconomic History  . Marital status: Married    Spouse name: Not on file  . Number of children: 3  . Years of education: Not on file  . Highest education level: Not on file  Occupational History    Employer: [employer]  Social Needs  . Financial resource strain: Not on file  . Food insecurity:    Worry: Not on file    Inability: Not on file  . Transportation needs:    Medical: Not on file    Non-medical: Not on file  Tobacco Use  . Smoking status: Never Smoker  . Smokeless tobacco: Never Used  Substance and Sexual Activity  . Alcohol use: No  . Drug use: No  . Sexual activity: Not Currently    Birth control/protection: Surgical  Lifestyle  . Physical activity:    Days per week: Not on file    Minutes per session: Not on file  . Stress: Not on file  Relationships  . Social connections:    Talks on phone: Not on file    Gets together: Not on file    Attends religious service: Not on file    Active member of club or organization: Not on file    Attends meetings of clubs or organizations: Not on file    Relationship status: Not on file  Other Topics Concern  . 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     3 children- 69 daughter, 63 son, 76 daughter               Allergies  Allergen Reactions  . Adhesive [Tape] Hives   Family History  Problem Relation Age of Onset  . Hypertension Mother   . Hypertension Father   . Cancer Father        ? colon cancer  . Cancer Maternal Grandmother        ovarian cancer  . Hypertension Maternal Grandfather   . Hypertension Paternal Grandfather      Current Outpatient Medications (Cardiovascular):  .  losartan (COZAAR) 50 MG tablet, Take 1 tablet (50 mg total) by mouth daily.   Current Outpatient Medications (Analgesics):  .  ibuprofen (ADVIL,MOTRIN) 800 MG tablet, Take 1 tablet  (800 mg total) by mouth 3 (three) times daily.   Current Outpatient Medications (Other):  .  meclizine (ANTIVERT) 25 MG tablet, Take 1 tablet (25 mg total) by mouth 3 (three) times daily as needed for dizziness. .  methocarbamol (ROBAXIN) 500 MG tablet, Take 1 tablet (500 mg total) by mouth 2 (two) times daily. .  ondansetron (ZOFRAN ODT) 8 MG disintegrating tablet, Take 1 tablet (8 mg total) by mouth every 8 (eight) hours as needed for nausea or vomiting.    Past medical history, social, surgical and family history all reviewed in electronic medical record.  No pertanent information unless stated regarding to the chief complaint.   Review of Systems:  No headache, visual changes, nausea, vomiting, diarrhea, constipation, dizziness, abdominal pain, skin rash, fevers, chills, night sweats, weight loss, swollen lymph nodes, body aches, joint swelling, muscle aches, chest pain, shortness of breath, mood changes.   Objective  Blood pressure 118/82, pulse 69, height 5\' 5"  (1.651 m), weight 251 lb (113.9 kg), SpO2 98 %.     General: No apparent distress alert and oriented x3 mood and affect normal, dressed appropriately.  HEENT: Pupils equal, extraocular movements intact  Respiratory: Patient's speak in full sentences and does not appear short of breath  Cardiovascular: No lower extremity edema, non tender, no erythema  Skin: Warm dry intact with no signs of infection or rash on extremities or on axial skeleton.  Abdomen: Soft nontender  Neuro: Cranial nerves II through XII are intact, neurovascularly intact in all extremities with 2+ DTRs and 2+ pulses.  Lymph: No lymphadenopathy of posterior or anterior cervical chain or axillae bilaterally.  Gait normal with good balance and coordination.  MSK:  Non tender with full range of motion and good stability and symmetric strength and tone of shoulders, elbows, wrist, hip,and ankles bilaterally.  Knee: Right valgus deformity noted.  Abnormal thigh  to calf ratio.  Tender to palpation over medial and PF joint line.  ROM full in flexion and extension and lower leg rotation. instability with valgus force.  painful patellar compression. Patellar glide with moderate crepitus. Patellar and quadriceps tendons unremarkable. Hamstring and quadriceps strength is normal. Contralateral knee shows mild significant arthritic changes.  No true instability   After informed written and verbal consent, patient was seated on exam table. Right knee was prepped with alcohol swab and utilizing anterolateral approach, patient's right knee space was injected with 4:1  marcaine 0.5%: Kenalog 40mg /dL. Patient tolerated the procedure well without immediate complications.    Impression and Recommendations:     This case required medical decision  making of moderate complexity. The above documentation has been reviewed and is accurate and complete Lyndal Pulley, DO       Note: This dictation was prepared with Dragon dictation along with smaller phrase technology. Any transcriptional errors that result from this process are unintentional.

## 2018-09-16 ENCOUNTER — Ambulatory Visit: Payer: BLUE CROSS/BLUE SHIELD | Admitting: Family Medicine

## 2018-09-16 ENCOUNTER — Encounter: Payer: Self-pay | Admitting: Family Medicine

## 2018-09-16 VITALS — BP 118/82 | HR 69 | Ht 65.0 in | Wt 251.0 lb

## 2018-09-16 DIAGNOSIS — M1711 Unilateral primary osteoarthritis, right knee: Secondary | ICD-10-CM

## 2018-09-16 NOTE — Assessment & Plan Note (Signed)
Patient was given injection.  Discussed icing regimen and home exercise.  Which activities to do which wants to avoid.  Follow-up in 4 to 8 weeks.  Can repeat every 3 months.  Sent to formal physical therapy.

## 2018-09-16 NOTE — Patient Instructions (Addendum)
Good to see you  Ice 20 minutes 2 times daily. Usually after activity and before bed. Exercises 3 times a week.  Stay active Biking and elliptical more for cardio  Continue the vitamins See me again in 12 weeks

## 2018-09-24 ENCOUNTER — Observation Stay (HOSPITAL_BASED_OUTPATIENT_CLINIC_OR_DEPARTMENT_OTHER): Payer: BLUE CROSS/BLUE SHIELD

## 2018-09-24 ENCOUNTER — Observation Stay (HOSPITAL_BASED_OUTPATIENT_CLINIC_OR_DEPARTMENT_OTHER)
Admission: EM | Admit: 2018-09-24 | Discharge: 2018-09-25 | Disposition: A | Discharge status: Home / Self Care | Payer: BLUE CROSS/BLUE SHIELD | Attending: Internal Medicine | Admitting: Internal Medicine

## 2018-09-24 ENCOUNTER — Other Ambulatory Visit: Payer: Self-pay

## 2018-09-24 ENCOUNTER — Emergency Department (HOSPITAL_BASED_OUTPATIENT_CLINIC_OR_DEPARTMENT_OTHER): Payer: BLUE CROSS/BLUE SHIELD

## 2018-09-24 ENCOUNTER — Encounter (HOSPITAL_BASED_OUTPATIENT_CLINIC_OR_DEPARTMENT_OTHER): Payer: Self-pay | Admitting: Emergency Medicine

## 2018-09-24 ENCOUNTER — Other Ambulatory Visit (HOSPITAL_COMMUNITY): Payer: BLUE CROSS/BLUE SHIELD

## 2018-09-24 DIAGNOSIS — I10 Essential (primary) hypertension: Secondary | ICD-10-CM | POA: Diagnosis present

## 2018-09-24 DIAGNOSIS — I2510 Atherosclerotic heart disease of native coronary artery without angina pectoris: Secondary | ICD-10-CM

## 2018-09-24 DIAGNOSIS — R079 Chest pain, unspecified: Secondary | ICD-10-CM | POA: Diagnosis present

## 2018-09-24 DIAGNOSIS — Z79899 Other long term (current) drug therapy: Secondary | ICD-10-CM | POA: Insufficient documentation

## 2018-09-24 DIAGNOSIS — R072 Precordial pain: Secondary | ICD-10-CM | POA: Diagnosis not present

## 2018-09-24 LAB — CBC WITH DIFFERENTIAL/PLATELET
ABS IMMATURE GRANULOCYTES: 0 10*3/uL (ref 0.00–0.07)
BASOS PCT: 1 %
Basophils Absolute: 0 10*3/uL (ref 0.0–0.1)
Eosinophils Absolute: 0.2 10*3/uL (ref 0.0–0.5)
Eosinophils Relative: 4 %
HEMATOCRIT: 41 % (ref 36.0–46.0)
HEMOGLOBIN: 13.1 g/dL (ref 12.0–15.0)
Immature Granulocytes: 0 %
Lymphocytes Relative: 40 %
Lymphs Abs: 2.1 10*3/uL (ref 0.7–4.0)
MCH: 28.1 pg (ref 26.0–34.0)
MCHC: 32 g/dL (ref 30.0–36.0)
MCV: 87.8 fL (ref 80.0–100.0)
MONO ABS: 0.8 10*3/uL (ref 0.1–1.0)
Monocytes Relative: 15 %
NEUTROS ABS: 2.1 10*3/uL (ref 1.7–7.7)
Neutrophils Relative %: 40 %
Platelets: 268 10*3/uL (ref 150–400)
RBC: 4.67 MIL/uL (ref 3.87–5.11)
RDW: 13 % (ref 11.5–15.5)
WBC: 5.2 10*3/uL (ref 4.0–10.5)
nRBC: 0 % (ref 0.0–0.2)

## 2018-09-24 LAB — BASIC METABOLIC PANEL
ANION GAP: 8 (ref 5–15)
BUN: 12 mg/dL (ref 6–20)
CHLORIDE: 104 mmol/L (ref 98–111)
CO2: 27 mmol/L (ref 22–32)
CREATININE: 0.64 mg/dL (ref 0.44–1.00)
Calcium: 9 mg/dL (ref 8.9–10.3)
GFR calc non Af Amer: >60 mL/min (ref >60)
Glucose, Bld: 90 mg/dL (ref 70–99)
Potassium: 3.6 mmol/L (ref 3.5–5.1)
Sodium: 139 mmol/L (ref 135–145)

## 2018-09-24 LAB — RAPID URINE DRUG SCREEN, HOSP PERFORMED
AMPHETAMINES: NOT DETECTED
Barbiturates: NOT DETECTED
Benzodiazepines: NOT DETECTED
COCAINE: NOT DETECTED
Opiates: POSITIVE — AB
TETRAHYDROCANNABINOL: NOT DETECTED

## 2018-09-24 LAB — LIPID PANEL
CHOL/HDL RATIO: 3 ratio
CHOLESTEROL: 189 mg/dL (ref 0–200)
HDL: 62 mg/dL (ref >40)
LDL Cholesterol: 120 mg/dL — ABNORMAL HIGH (ref 0–99)
TRIGLYCERIDES: 36 mg/dL (ref <150)
VLDL: 7 mg/dL (ref 0–40)

## 2018-09-24 LAB — MRSA PCR SCREENING: MRSA by PCR: NEGATIVE

## 2018-09-24 LAB — TROPONIN I
Troponin I: <0.03 ng/mL (ref <0.03)
Troponin I: <0.03 ng/mL (ref <0.03)

## 2018-09-24 LAB — HEPARIN LEVEL (UNFRACTIONATED): Heparin Unfractionated: <0.1 IU/mL — ABNORMAL LOW (ref 0.30–0.70)

## 2018-09-24 LAB — TSH: TSH: 1.067 u[IU]/mL (ref 0.350–4.500)

## 2018-09-24 MED ORDER — ATORVASTATIN CALCIUM 20 MG PO TABS
20.0000 mg | ORAL_TABLET | Freq: Every day | ORAL | Status: DC
  Administered 2018-09-24 – 2018-09-25 (×2): 20 mg via ORAL
  Filled 2018-09-24 (×2): qty 1

## 2018-09-24 MED ORDER — NITROGLYCERIN IN D5W 200-5 MCG/ML-% IV SOLN
0.0000 ug/min | INTRAVENOUS | Status: DC
  Administered 2018-09-24: 5 ug/min via INTRAVENOUS
  Filled 2018-09-24: qty 250

## 2018-09-24 MED ORDER — SODIUM CHLORIDE 0.9% FLUSH: 3.0000 mL | INTRAVENOUS | Status: DC | PRN

## 2018-09-24 MED ORDER — ASPIRIN EC 81 MG PO TBEC
81.0000 mg | DELAYED_RELEASE_TABLET | Freq: Every day | ORAL | Status: DC
  Administered 2018-09-25: 81 mg via ORAL
  Filled 2018-09-24: qty 1

## 2018-09-24 MED ORDER — MORPHINE SULFATE (PF) 2 MG/ML IV SOLN
2.0000 mg | INTRAVENOUS | Status: DC | PRN
  Administered 2018-09-24 (×3): 2 mg via INTRAVENOUS
  Filled 2018-09-24 (×3): qty 1

## 2018-09-24 MED ORDER — METOPROLOL TARTRATE 12.5 MG HALF TABLET: 12.5000 mg | ORAL_TABLET | Freq: Two times a day (BID) | ORAL | Status: DC

## 2018-09-24 MED ORDER — ASPIRIN 81 MG PO CHEW
324.0000 mg | CHEWABLE_TABLET | Freq: Once | ORAL | Status: AC
  Administered 2018-09-24: 324 mg via ORAL
  Filled 2018-09-24: qty 4

## 2018-09-24 MED ORDER — ENALAPRIL-HYDROCHLOROTHIAZIDE 5-12.5 MG PO TABS: 1.0000 | ORAL_TABLET | ORAL | Status: DC

## 2018-09-24 MED ORDER — ENALAPRIL MALEATE 5 MG PO TABS
5.0000 mg | ORAL_TABLET | Freq: Every day | ORAL | Status: DC
  Administered 2018-09-24 – 2018-09-25 (×2): 5 mg via ORAL
  Filled 2018-09-24 (×2): qty 1

## 2018-09-24 MED ORDER — HEPARIN BOLUS VIA INFUSION
4000.0000 [IU] | Freq: Once | INTRAVENOUS | Status: AC
  Administered 2018-09-24: 4000 [IU] via INTRAVENOUS
  Filled 2018-09-24: qty 4000

## 2018-09-24 MED ORDER — ONDANSETRON HCL 4 MG/2ML IJ SOLN: 4.0000 mg | Freq: Four times a day (QID) | INTRAMUSCULAR | Status: DC | PRN

## 2018-09-24 MED ORDER — METOPROLOL TARTRATE 5 MG/5ML IV SOLN
5.0000 mg | Freq: Once | INTRAVENOUS | Status: DC
  Filled 2018-09-24: qty 5

## 2018-09-24 MED ORDER — HYDROCHLOROTHIAZIDE 12.5 MG PO CAPS
12.5000 mg | ORAL_CAPSULE | Freq: Every day | ORAL | Status: DC
  Administered 2018-09-24 – 2018-09-25 (×2): 12.5 mg via ORAL
  Filled 2018-09-24 (×2): qty 1

## 2018-09-24 MED ORDER — SODIUM CHLORIDE 0.9 % IV SOLN: 250.0000 mL | INTRAVENOUS | Status: DC | PRN

## 2018-09-24 MED ORDER — NITROGLYCERIN 0.4 MG SL SUBL
0.4000 mg | SUBLINGUAL_TABLET | SUBLINGUAL | Status: AC | PRN
  Administered 2018-09-24 (×3): 0.4 mg via SUBLINGUAL
  Filled 2018-09-24 (×2): qty 1

## 2018-09-24 MED ORDER — SODIUM CHLORIDE 0.9% FLUSH
3.0000 mL | Freq: Two times a day (BID) | INTRAVENOUS | Status: DC
  Administered 2018-09-24 (×2): 3 mL via INTRAVENOUS

## 2018-09-24 MED ORDER — HEPARIN (PORCINE) IN NACL 100-0.45 UNIT/ML-% IJ SOLN
1300.0000 [IU]/h | INTRAMUSCULAR | Status: DC
  Administered 2018-09-24: 1300 [IU]/h via INTRAVENOUS
  Filled 2018-09-24: qty 250

## 2018-09-24 MED ORDER — ACETAMINOPHEN 325 MG PO TABS
650.0000 mg | ORAL_TABLET | ORAL | Status: DC | PRN
  Administered 2018-09-25 (×4): 650 mg via ORAL
  Filled 2018-09-24 (×4): qty 2

## 2018-09-24 MED ORDER — METOPROLOL TARTRATE 12.5 MG HALF TABLET
12.5000 mg | ORAL_TABLET | Freq: Two times a day (BID) | ORAL | Status: DC
  Administered 2018-09-24 – 2018-09-25 (×3): 12.5 mg via ORAL
  Filled 2018-09-24 (×3): qty 1

## 2018-09-24 NOTE — ED Notes (Signed)
Pt states the pressure in her chest returned  Rating it an 8/10 on pain scale  EDP notified  Orders received

## 2018-09-24 NOTE — ED Triage Notes (Signed)
Pt c/o 8/10 left side cp radiating to left arm and neck with some dizziness that woke her up from sleep.

## 2018-09-24 NOTE — ED Provider Notes (Signed)
[facility] Provider Note: Lowella Dell, MD, FACEP  CSN: [id number] MRN: [id number] ARRIVAL: 09/24/18 at 0329 ROOM: MH04/[facility]04   CHIEF COMPLAINT  Chest Pain   HISTORY OF PRESENT ILLNESS  09/24/18 3:45 AM Debra Carpenter is a 52 y.o. female who was awakened from sleep about an hour ago with chest pain.  The chest pain was precordial and felt like a grabbing sensation.  She describes it as severe.  It lasted about 30 seconds.  She had a second episode which was also about 30 seconds in length but radiated to her left hand.  These were accompanied by equivocal shortness of breath.  She is now having a milder aching discomfort in her neck which she is having difficulty describing.   Past Medical History:  Diagnosis Date  . Anginal pain (HCC) 9/12   states negative cardiology workup exception of murmur  . Anxiety   . Anxiety and depression   . Bony sclerosis 08/22/2011  . Depression   . GERD (gastroesophageal reflux disease)   . Heart murmur   . Hypertension   . Lung nodule 08/24/2011    Past Surgical History:  Procedure Laterality Date  . ABDOMINAL HYSTERECTOMY  2007  . LAPAROSCOPIC GASTRIC BANDING  2011  . LAPAROSCOPIC GASTRIC SLEEVE RESECTION N/A 07/21/2013   Procedure: LAPAROSCOPIC GASTRIC SLEEVE RESECTION, LYSIS OF ADHESIONS, EGD;  Surgeon: Lodema Pilot, DO;  Location: [facility];  Service: General;  Laterality: N/A;  . LAPAROSCOPIC LYSIS OF ADHESIONS  07/21/2013   Procedure: LAPAROSCOPIC LYSIS OF ADHESIONS;  Surgeon: Lodema Pilot, DO;  Location: [facility];  Service: General;;  . LAPAROSCOPIC REPAIR AND REMOVAL OF GASTRIC BAND N/A 05/05/2013   Procedure: LAPAROSCOPIC REMOVAL OF ADJUSTABLE GASTRIC BAND AND PORT, ENDOSCOPY ;  Surgeon: Lodema Pilot, DO;  Location: [facility];  Service: General;  Laterality: N/A;  laparoscopic removal of adjustable gastric band AND PORT   . UPPER GI ENDOSCOPY  07/21/2013   Procedure: UPPER GI ENDOSCOPY;  Surgeon: Lodema Pilot, DO;  Location: [facility];   Service: General;;    Family History  Problem Relation Age of Onset  . Hypertension Mother   . Hypertension Father   . Cancer Father        ? colon cancer  . Cancer Maternal Grandmother        ovarian cancer  . Hypertension Maternal Grandfather   . Hypertension Paternal Grandfather     Social History   Tobacco Use  . Smoking status: Never Smoker  . Smokeless tobacco: Never Used  Substance Use Topics  . Alcohol use: No  . Drug use: No    Prior to Admission medications   Medication Sig Start Date End Date Taking? Authorizing Provider  ibuprofen (ADVIL,MOTRIN) 800 MG tablet Take 1 tablet (800 mg total) by mouth 3 (three) times daily. 09/05/16   Elson Areas, PA-C  losartan (COZAAR) 50 MG tablet Take 1 tablet (50 mg total) by mouth daily. 04/13/15   Geoffery Lyons, MD  meclizine (ANTIVERT) 25 MG tablet Take 1 tablet (25 mg total) by mouth 3 (three) times daily as needed for dizziness. 04/13/15   Geoffery Lyons, MD  methocarbamol (ROBAXIN) 500 MG tablet Take 1 tablet (500 mg total) by mouth 2 (two) times daily. 09/05/16   Elson Areas, PA-C  ondansetron (ZOFRAN ODT) 8 MG disintegrating tablet Take 1 tablet (8 mg total) by mouth every 8 (eight) hours as needed for nausea or vomiting. 01/10/15   Eudelia Hiltunen, Jonny Ruiz, MD  prochlorperazine (COMPAZINE) 25  MG suppository Place 1 suppository (25 mg total) rectally every 12 (twelve) hours as needed. 06/17/14 01/10/15  Palumbo, April, MD    Allergies Adhesive [tape]   REVIEW OF SYSTEMS  Negative except as noted here or in the History of Present Illness.   PHYSICAL EXAMINATION  Initial Vital Signs Blood pressure (!) 198/102, pulse 74, temperature 98.2 F (36.8 C), temperature source Oral, resp. rate 14, height 5\' 5"  (1.651 m), weight 113.4 kg, SpO2 100 %.  Examination General: Well-developed, well-nourished female in no acute distress; appearance consistent with age of record HENT: normocephalic; atraumatic Eyes: pupils equal, round and  reactive to light; extraocular muscles intact Neck: supple Heart: regular rate and rhythm; no murmur Lungs: clear to auscultation bilaterally Chest: Nontender Abdomen: soft; nondistended; nontender; bowel sounds present Extremities: No deformity; full range of motion; pulses normal Neurologic: Awake, alert and oriented; motor function intact in all extremities and symmetric; no facial droop Skin: Warm and dry Psychiatric: Normal mood and affect   RESULTS  Summary of this visit's results, reviewed by myself:   EKG Interpretation  Date/Time:  Wednesday September 24 2018 03:41:36 EDT Ventricular Rate:  69 PR Interval:    QRS Duration: 92 QT Interval:  427 QTC Calculation: 458 R Axis:   31 Text Interpretation:  Sinus rhythm Borderline T wave abnormalities No significant change was found Confirmed by Paula Libra ([id number]) on 09/24/2018 3:45:41 AM      Laboratory Studies: Results for orders placed or performed during the hospital encounter of 09/24/18 (from the past 24 hour(s))  Basic metabolic panel     Status: None   Collection Time: 09/24/18  3:41 AM  Result Value Ref Range   Sodium 139 135 - 145 mmol/L   Potassium 3.6 3.5 - 5.1 mmol/L   Chloride 104 98 - 111 mmol/L   CO2 27 22 - 32 mmol/L   Glucose, Bld 90 70 - 99 mg/dL   BUN 12 6 - 20 mg/dL   Creatinine, Ser 1.91 0.44 - 1.00 mg/dL   Calcium 9.0 8.9 - 47.8 mg/dL   GFR calc non Af Amer >60 >60 mL/min   GFR calc Af Amer >60 >60 mL/min   Anion gap 8 5 - 15  Troponin I     Status: None   Collection Time: 09/24/18  3:41 AM  Result Value Ref Range   Troponin I <0.03 <0.03 ng/mL  CBC with Differential/Platelet     Status: None   Collection Time: 09/24/18  3:41 AM  Result Value Ref Range   WBC 5.2 4.0 - 10.5 K/uL   RBC 4.67 3.87 - 5.11 MIL/uL   Hemoglobin 13.1 12.0 - 15.0 g/dL   HCT 29.5 62.1 - 30.8 %   MCV 87.8 80.0 - 100.0 fL   MCH 28.1 26.0 - 34.0 pg   MCHC 32.0 30.0 - 36.0 g/dL   RDW 65.7 84.6 - 96.2 %   Platelets  268 150 - 400 K/uL   nRBC 0.0 0.0 - 0.2 %   Neutrophils Relative % 40 %   Neutro Abs 2.1 1.7 - 7.7 K/uL   Lymphocytes Relative 40 %   Lymphs Abs 2.1 0.7 - 4.0 K/uL   Monocytes Relative 15 %   Monocytes Absolute 0.8 0.1 - 1.0 K/uL   Eosinophils Relative 4 %   Eosinophils Absolute 0.2 0.0 - 0.5 K/uL   Basophils Relative 1 %   Basophils Absolute 0.0 0.0 - 0.1 K/uL   Immature Granulocytes 0 %   Abs Immature  Granulocytes 0.00 0.00 - 0.07 K/uL   Imaging Studies: Dg Chest 2 View  Result Date: 09/24/2018 CLINICAL DATA:  52 year old female with chest pain. EXAM: CHEST - 2 VIEW COMPARISON:  Chest radiograph dated 07/16/2013 FINDINGS: The heart size and mediastinal contours are within normal limits. Both lungs are clear. The visualized skeletal structures are unremarkable. IMPRESSION: No active cardiopulmonary disease. Electronically Signed   By: Elgie Collard M.D.   On: 09/24/2018 04:50    ED COURSE and MDM  Nursing notes and initial vitals signs, including pulse oximetry, reviewed.  Vitals:   09/24/18 0439 09/24/18 0500 09/24/18 0530 09/24/18 0554  BP: (!) 159/84 (!) 156/90 (!) 144/100 (!) 146/90  Pulse: (!) 53 (!) 48 (!) 56 68  Resp: 13 18 (!) 23 13  Temp:      TempSrc:      SpO2: 97% 100% 99% 99%  Weight:      Height:       5:22 AM Patient denies chest pain after initial nitroglycerin but still having some mild neck discomfort.  Repeat troponin at 7 AM.  5:50 AM Patient complains of returning chest discomfort.  Additional nitroglycerin ordered.  6:33 AM Dr. Julian Reil except for transfer to [facility].  Chest pain improved.   PROCEDURES    ED DIAGNOSES     ICD-10-CM   1. Chest pain at rest R07.9        Paula Libra, MD 09/24/18 [phone]

## 2018-09-24 NOTE — Progress Notes (Signed)
  Echocardiogram 2D Echocardiogram has been performed.  Delcie Roch 09/24/2018, 6:07 PM

## 2018-09-24 NOTE — ED Notes (Signed)
Patient transported to X-ray 

## 2018-09-24 NOTE — Plan of Care (Signed)
52 yo F presents to ED with c/o CP, woke her from sleep.  Very severe, lasted 30 sec but then improved.  Initially resolved in ED with NTG but then had recurrent CP, trying second NTG now.  First trop neg.  Will put in for SDU obs.

## 2018-09-24 NOTE — Consult Note (Addendum)
Cardiology Consultation:   Patient ID: Debra Carpenter MRN: [id number]; DOB: [birth year]  Admit date: 09/24/2018 Date of Consult: 09/24/2018  Primary Care Provider: Angelica Chessman, MD Primary Cardiologist: New  Patient Profile:   Debra Carpenter is a 52 y.o. AA female with a hx of HTN, GERD and family h/o CAD who is being seen today for the evaluation of CP at the request of Dr. Ophelia Charter, Internal Medicine.   History of Present Illness:   Debra Carpenter is a 52 y.o. AA female with a hx of HTN, GERD and family h/o CAD who is being seen today for the evaluation of CP at the request of Dr. Ophelia Charter, Internal Medicine.   Pt reports the was seen by a cardiologist several years ago for cardiac evaluation. She recalls having a stress test and echo and was told studies were normal. Per chart review, she had an echo in 07/2011 that showed normal LVEF. I do not have access to stress test report.   Her cardiac risk factors include HTN. Pt is on medications (ACEi, thiazide and  blocker) and reports that BP has been controlled prior to recently. She also has a FH of CAD. Her mother has 7 stents, a PPM and afib. She was first diagnosed with CAD in her 75s. Her father also has CAD and had a MI in his 57s. Her paternal grandfather died suddenly from a MI in his 48s. She denies any personal h/o DM and HLD. No tobacco history.   She reports she was in her usual state of health until last night. She went to bed feeling fine. She had chicken noodle soup for dinner. Around 2AM she was awoken from sleep with severe left sided chest pain. Initial pain was sharp stabbing pain, coming and going, lasting about 30-40 sec at a time. She later developed pressure like CP radiating to her left arm and hand. tingling sensation in her fingers. She initially thought it was gas and tried Gas-x and Soda w/o any relief. Worse in supine position. Relived sitting up. Non exertional. No significant dyspnea. She came to the ED, got SL NTG which  helped to relieve the pain, but not completely resolved. BP was markedly elevated at 198/102. EKG shows NSR with nonspecific TW abnormalities. No significant change from prior. Troponin negative x 2. Lipid panel elevated at 120 mg/dL. CXR negative. Pt admitted by IM and orders placed to start IV nitro drip. Cardiology asked to evaluate.   Upon my evaluation, pt continued to have ongoing CP w/o EKG change and normal troponin's. BP remains elevated. RN in the process of starting IV nitro.    Past Medical History:  Diagnosis Date  . Anginal pain (HCC) 9/12   states negative cardiology workup exception of murmur  . Anxiety and depression   . Bony sclerosis 08/22/2011  . GERD (gastroesophageal reflux disease)   . Heart murmur   . Hypertension   . Lung nodule 08/24/2011    Past Surgical History:  Procedure Laterality Date  . ABDOMINAL HYSTERECTOMY  2007  . LAPAROSCOPIC GASTRIC BANDING  2011  . LAPAROSCOPIC GASTRIC SLEEVE RESECTION N/A 07/21/2013   Procedure: LAPAROSCOPIC GASTRIC SLEEVE RESECTION, LYSIS OF ADHESIONS, EGD;  Surgeon: Lodema Pilot, DO;  Location: [facility];  Service: General;  Laterality: N/A;  . LAPAROSCOPIC LYSIS OF ADHESIONS  07/21/2013   Procedure: LAPAROSCOPIC LYSIS OF ADHESIONS;  Surgeon: Lodema Pilot, DO;  Location: [facility];  Service: General;;  . LAPAROSCOPIC REPAIR AND REMOVAL OF GASTRIC BAND N/A 05/05/2013  Procedure: LAPAROSCOPIC REMOVAL OF ADJUSTABLE GASTRIC BAND AND PORT, ENDOSCOPY ;  Surgeon: Lodema Pilot, DO;  Location: [facility];  Service: General;  Laterality: N/A;  laparoscopic removal of adjustable gastric band AND PORT   . UPPER GI ENDOSCOPY  07/21/2013   Procedure: UPPER GI ENDOSCOPY;  Surgeon: Lodema Pilot, DO;  Location: [facility];  Service: General;;     Home Medications:  Prior to Admission medications   Medication Sig Start Date End Date Taking? Authorizing Provider  acetaminophen (TYLENOL) 650 MG CR tablet Take 650 mg by mouth every 8 (eight) hours as needed for  pain.   Yes [provider]  cetirizine (ZYRTEC) 10 MG tablet Take 10 mg by mouth daily as needed for allergies.   Yes [provider]  Enalapril-hydroCHLOROthiazide 5-12.5 MG tablet Take 1 tablet by mouth every morning. 12/26/17 12/26/18 Yes [provider]  prochlorperazine (COMPAZINE) 25 MG suppository Place 1 suppository (25 mg total) rectally every 12 (twelve) hours as needed. 06/17/14 01/10/15  Palumbo, April, MD    Inpatient Medications: Scheduled Meds: . [START ON 09/25/2018] aspirin EC  81 mg Oral Daily  . atorvastatin  20 mg Oral q1800  . enalapril  5 mg Oral Daily   And  . hydrochlorothiazide  12.5 mg Oral Daily  . metoprolol tartrate  12.5 mg Oral BID  . sodium chloride flush  3 mL Intravenous Q12H   Continuous Infusions: . sodium chloride    . heparin 1,300 Units/hr (09/24/18 1225)  . nitroGLYCERIN 5 mcg/min (09/24/18 1215)   PRN Meds: sodium chloride, acetaminophen, morphine injection, ondansetron (ZOFRAN) IV, sodium chloride flush  Allergies:    Allergies  Allergen Reactions  . Adhesive [Tape] Hives    Social History:   Social History   Socioeconomic History  . Marital status: Married    Spouse name: Not on file  . Number of children: 3  . Years of education: Not on file  . Highest education level: Not on file  Occupational History  . 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 Financial resource strain: Not on file  . Food insecurity:    Worry: Not on file    Inability: Not on file  . Transportation needs:    Medical: Not on file    Non-medical: Not on file  Tobacco Use  . Smoking status: Never Smoker  . Smokeless tobacco: Never Used  Substance and Sexual Activity  . Alcohol use: No  . Drug use: No  . Sexual activity: Not Currently    Birth control/protection: Surgical  Lifestyle  . Physical activity:    Days per week: Not on file    Minutes per session: Not on file  . Stress: Not on  file  Relationships  . Social connections:    Talks on phone: Not on file    Gets together: Not on file    Attends religious service: Not on file    Active member of club or organization: Not on file    Attends meetings of clubs or organizations: Not on file    Relationship status: Not on file  . Intimate partner violence:    Fear of current or ex partner: Not on file    Emotionally abused: Not on file    Physically abused: Not on file    Forced sexual activity: Not on file  Other Topics Concern  . 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     3 children- 25 daughter, 77 son, 34 daughter                Family History:    Family History  Problem Relation Age of Onset  . Hypertension Mother   . Hypertension Father   . Cancer Father        ? colon cancer  . Cancer Maternal Grandmother        ovarian cancer  . Hypertension Maternal Grandfather   . Hypertension Paternal Grandfather      ROS:  Please see the history of present illness.   All other ROS reviewed and negative.     Physical Exam/Data:   Vitals:   09/24/18 0945 09/24/18 0954 09/24/18 1253 09/24/18 1257  BP:  (!) 187/102 (!) 141/84 (!) 141/84  Pulse:   66   Resp:   16   Temp:  98.2 F (36.8 C) 98.6 F (37 C)   TempSrc:  Oral Oral   SpO2:  100% 96%   Weight: 112.6 kg     Height:        Intake/Output Summary (Last 24 hours) at 09/24/2018 1306 Last data filed at 09/24/2018 0900 Gross per 24 hour  Intake 0 ml  Output -  Net 0 ml   Filed Weights   09/24/18 0336 09/24/18 0945  Weight: 113.4 kg 112.6 kg   Body mass index is 35.63 kg/m.  General:  Well nourished, well developed, in no acute distress HEENT: normal Lymph: no adenopathy Neck: no JVD Endocrine:  No thryomegaly Vascular: No carotid bruits; FA pulses 2+ bilaterally without bruits  Cardiac:  normal S1, S2; RRR; no  murmur  Lungs:  clear to auscultation bilaterally, no wheezing, rhonchi or rales  Abd: soft, nontender, no hepatomegaly  Ext: no edema Musculoskeletal:  No deformities, BUE and BLE strength normal and equal Skin: warm and dry  Neuro:  CNs 2-12 intact, no focal abnormalities noted Psych:  Normal affect   EKG:  The EKG was personally reviewed and demonstrates:  NSR, non specific Twave abnormalties Telemetry:  Telemetry was personally reviewed and demonstrates:  NSR. No arrhthymias.   Relevant CV Studies: none  Laboratory Data:  Chemistry Recent Labs  Lab 09/24/18 0341  NA 139  K 3.6  CL 104  CO2 27  GLUCOSE 90  BUN 12  CREATININE 0.64  CALCIUM 9.0  GFRNONAA >60  GFRAA >60  ANIONGAP 8    No results for input(s): PROT, ALBUMIN, AST, ALT, ALKPHOS, BILITOT in the last 168 hours. Hematology Recent Labs  Lab 09/24/18 0341  WBC 5.2  RBC 4.67  HGB 13.1  HCT 41.0  MCV 87.8  MCH 28.1  MCHC 32.0  RDW 13.0  PLT 268   Cardiac Enzymes Recent Labs  Lab 09/24/18 0341 09/24/18 0657  TROPONINI <0.03 <0.03   No results for input(s): TROPIPOC in the last 168 hours.  BNPNo results for input(s): BNP, PROBNP in the last 168 hours.  DDimer No results for input(s): DDIMER in the last 168 hours.  Radiology/Studies:  Dg Chest 2 View  Result Date: 09/24/2018 CLINICAL DATA:  52 year old female with chest pain. EXAM: CHEST - 2 VIEW COMPARISON:  Chest radiograph dated 07/16/2013 FINDINGS: The heart size and mediastinal contours are within normal limits. Both lungs are clear. The visualized skeletal structures are unremarkable. IMPRESSION: No active cardiopulmonary disease. Electronically Signed  By: Elgie Collard M.D.   On: 09/24/2018 04:50    Assessment and Plan:   Loghan Subia is a 52 y.o. AA female with a hx of HTN, GERD and family h/o CAD who is being seen today for the evaluation of CP at the request of Dr. Ophelia Charter, Internal Medicine.   1. Chest Pain: new onset left sided CP  with left arm radiation, in the setting of markedly elevated BP. Symptoms onset ~12 hrs ago w/o enzyme elevation. Troponin negative x 2. EKG shows NSR w/ nonspecific TW abnormalities. CXR negative. Suspect her elevated BP is contributing but given her cardiac risk factors (HTN, FH of CAD and new diagnosis of HLD w/ LDL at 120 mg/dL), we will need to r/o underlying CAD.  I agree with IV nitro for BP control. Continue home meds. Start statin for HLD. Once BP is better controlled would recommend noninvasive ischemic eval in the AM. Either coronary CTA vs NST. Also check echo. Continue to monitor. MD to follow with further recommendations.    For questions or updates, please contact [facility] Please consult [email or web address] for contact info under     Signed, Robbie Lis, PA-C  09/24/2018 1:06 PM   I have personally seen and examined this patient with Brittainy Simmon's, PA-C. I agree with the assessment and plan as outlined above. Ms. Surprenant has HTN and GERD. Strong FH of CAD. Presenting with chest pain that has typical and atypical features. BP was markedly elevated on admission. BP is better controlled now on a NTG drip and her chest pain has resolved. Troponin negative x 3. EKG without ischemic EKG findings. I have reviewed her EKG and it shows sinus with non-specific T wave abn. Labs reviewed by me.  General: Well developed, well nourished, NAD  HEENT: OP clear, mucus membranes moist  SKIN: warm, dry. No rashes. Neuro: No focal deficits  Musculoskeletal: Muscle strength 5/5 all ext  Psychiatric: Mood and affect normal  Neck: No JVD, no carotid bruits, no thyromegaly, no lymphadenopathy.  Lungs:Clear bilaterally, no wheezes, rhonci, crackles Cardiovascular: Regular rate and rhythm. No murmurs, gallops or rubs. Abdomen:Soft. Bowel sounds present. Non-tender.  Extremities: No lower extremity edema. Pulses are 2 + in the bilateral DP/PT.  Plan: Chest pain: She has no objective evidence  of ischemia. Given risk factors for CAD including HTN, new dx HLD, FH of CAD and obesity, will plan coronary CTA tomorrow to exclude CAD. Echo to assess LV systolic function and exclude pericardial effusion. I will stop the heparin drip. OK to continue NTG for BP control. She can resume her diet today.   Verne Carrow 09/24/2018 2:25 PM

## 2018-09-24 NOTE — ED Notes (Signed)
ED Provider at bedside. 

## 2018-09-24 NOTE — H&P (Signed)
History and Physical    Debra Carpenter KGM:[id number] DOB: 10-Jul-1966 DOA: 09/24/2018  PCP:  Ludwig Clarks  Consultants:  [place] - cardiology Patient coming from: Home - lives with husband; NOK: Husband, [phone]  Chief Complaint: Chest pain  HPI: Debra Carpenter is a 52 y.o. female with medical history significant of HTN; depression and anxiety; and chest pain with negative evaluation in 07/2011 presenting with chest pain. She woke up with chest pain, similar to the pain she is having now.  Left-sided chest pain, nonexertional.  She felt like she was gasping for air.  She jumped up "because it was such a force", it seemed to resolve and then recurred and lasted about 30 seconds.  She took some Pepto and Gas-X for indigestion.  She belched some and tried to go back to sleep but the pain recurred when she tried to lie down.  Her left arm felt heavy and her left hand felt like it was spasming.  She had improvement in the pain but then got pain in her left neck.  The doctor gave her NTG and it eased it.  The pain recurred and she was given NTG again and it eased it.  Her pain is having chest pain again and weakness in her left arm with tingling of the fingers.  She is also belching.  No prior h/o CAD.  She had an episode of chest pain in 2012 and "my heart was fine, they didn't find any issues with my heart."    No prior testing since 2012.  They did think she had a TIA last November.   ED Course: [place], per Dr. Julian Reil: 52 yo F presents to ED with c/o CP, woke her from sleep.  Very severe, lasted 30 sec but then improved.  Initially resolved in ED with NTG but then had recurrent CP, trying second NTG now.  First trop neg.  Will put in for SDU obs.  Review of Systems: As per HPI; otherwise review of systems reviewed and negative.   Ambulatory Status:  Ambulates without assistance  Past Medical History:  Diagnosis Date  . Anginal pain (HCC) 9/12   states negative cardiology workup exception of  murmur  . Anxiety and depression   . Bony sclerosis 08/22/2011  . GERD (gastroesophageal reflux disease)   . Heart murmur   . Hypertension   . Lung nodule 08/24/2011    Past Surgical History:  Procedure Laterality Date  . ABDOMINAL HYSTERECTOMY  2007  . LAPAROSCOPIC GASTRIC BANDING  2011  . LAPAROSCOPIC GASTRIC SLEEVE RESECTION N/A 07/21/2013   Procedure: LAPAROSCOPIC GASTRIC SLEEVE RESECTION, LYSIS OF ADHESIONS, EGD;  Surgeon: Lodema Pilot, DO;  Location: [facility];  Service: General;  Laterality: N/A;  . LAPAROSCOPIC LYSIS OF ADHESIONS  07/21/2013   Procedure: LAPAROSCOPIC LYSIS OF ADHESIONS;  Surgeon: Lodema Pilot, DO;  Location: [facility];  Service: General;;  . LAPAROSCOPIC REPAIR AND REMOVAL OF GASTRIC BAND N/A 05/05/2013   Procedure: LAPAROSCOPIC REMOVAL OF ADJUSTABLE GASTRIC BAND AND PORT, ENDOSCOPY ;  Surgeon: Lodema Pilot, DO;  Location: [facility];  Service: General;  Laterality: N/A;  laparoscopic removal of adjustable gastric band AND PORT   . UPPER GI ENDOSCOPY  07/21/2013   Procedure: UPPER GI ENDOSCOPY;  Surgeon: Lodema Pilot, DO;  Location: [facility];  Service: General;;    Social History   Socioeconomic History  . Marital status: Married    Spouse name: Not on file  . Number of children: 3  . Years of education: Not on  file  . Highest education level: Not on file  Occupational History  . 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 Financial resource strain: Not on file  . Food insecurity:    Worry: Not on file    Inability: Not on file  . Transportation needs:    Medical: Not on file    Non-medical: Not on file  Tobacco Use  . Smoking status: Never Smoker  . Smokeless tobacco: Never Used  Substance and Sexual Activity  . Alcohol use: No  . Drug use: No  . Sexual activity: Not Currently    Birth control/protection: Surgical  Lifestyle  . Physical activity:    Days per week: Not on file    Minutes per session: Not on file  . Stress:  Not on file  Relationships  . Social connections:    Talks on phone: Not on file    Gets together: Not on file    Attends religious service: Not on file    Active member of club or organization: Not on file    Attends meetings of clubs or organizations: Not on file    Relationship status: Not on file  . Intimate partner violence:    Fear of current or ex partner: Not on file    Emotionally abused: Not on file    Physically abused: Not on file    Forced sexual activity: Not on file  Other Topics Concern  . 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     3 children- 29 daughter, 58 son, 57 daughter                Allergies  Allergen Reactions  . Adhesive [Tape] Hives    Family History  Problem Relation Age of Onset  . Hypertension Mother   . Hypertension Father   . Cancer Father        ? colon cancer  . Cancer Maternal Grandmother        ovarian cancer  . Hypertension Maternal Grandfather   . Hypertension Paternal Grandfather     Prior to Admission medications   Medication Sig Start Date End Date Taking? Authorizing Provider  ibuprofen (ADVIL,MOTRIN) 800 MG tablet Take 1 tablet (800 mg total) by mouth 3 (three) times daily. 09/05/16   Elson Areas, PA-C  losartan (COZAAR) 50 MG tablet Take 1 tablet (50 mg total) by mouth daily. 04/13/15   Geoffery Lyons, MD  meclizine (ANTIVERT) 25 MG tablet Take 1 tablet (25 mg total) by mouth 3 (three) times daily as needed for dizziness. 04/13/15   Geoffery Lyons, MD  methocarbamol (ROBAXIN) 500 MG tablet Take 1 tablet (500 mg total) by mouth 2 (two) times daily. 09/05/16   Elson Areas, PA-C  ondansetron (ZOFRAN ODT) 8 MG disintegrating tablet Take 1 tablet (8 mg total) by mouth every 8 (eight) hours as needed for nausea or vomiting. 01/10/15   Molpus, Jonny Ruiz, MD  prochlorperazine (COMPAZINE) 25 MG suppository Place 1  suppository (25 mg total) rectally every 12 (twelve) hours as needed. 06/17/14 01/10/15  Palumbo, April, MD    Physical Exam: Vitals:   09/24/18 0954 09/24/18 1253 09/24/18 1257 09/24/18 1328  BP: (!) 187/102 (!) 141/84 (!) 141/84 (!) 152/83  Pulse:  66  (!) 59  Resp:  16  Temp: 98.2 F (36.8 C) 98.6 F (37 C)    TempSrc: Oral Oral    SpO2: 100% 96%    Weight:      Height:         General:  Appears calm and comfortable and is NAD Eyes:  EOMI, normal lids, iris ENT:  grossly normal hearing, lips & tongue, mmm; appropriate dentition Neck:  no LAD, masses or thyromegaly; no carotid bruits Cardiovascular:  RRR, no m/r/g. No LE edema.  Respiratory:   CTA bilaterally with no wheezes/rales/rhonchi.  Normal respiratory effort. Abdomen:  soft, NT, ND, NABS Back:   normal alignment, no CVAT Skin:  no rash or induration seen on limited exam Musculoskeletal:  grossly normal tone BUE/BLE, good ROM, no bony abnormality Psychiatric: grossly normal mood and affect, speech fluent and appropriate, AOx3 Neurologic:  CN 2-12 grossly intact, moves all extremities in coordinated fashion, sensation intact    Radiological Exams on Admission: Dg Chest 2 View  Result Date: 09/24/2018 CLINICAL DATA:  52 year old female with chest pain. EXAM: CHEST - 2 VIEW COMPARISON:  Chest radiograph dated 07/16/2013 FINDINGS: The heart size and mediastinal contours are within normal limits. Both lungs are clear. The visualized skeletal structures are unremarkable. IMPRESSION: No active cardiopulmonary disease. Electronically Signed   By: Elgie Collard M.D.   On: 09/24/2018 04:50    EKG: Independently reviewed.  NSR with rate 69; nonspecific ST changes with no evidence of acute ischemia   Labs on Admission: I have personally reviewed the available labs and imaging studies at the time of the admission.  Pertinent labs:   Normal BMP Troponin negative x 2 Normal CBC   Assessment/Plan Principal Problem:    Chest pain at rest Active Problems:   Essential hypertension   Chest pain -Patient with substernal chest pressure that came on at rest ad resolved with NTG; this pattern repeated itself multiple times and she continued to have pain at the time of my evaluation -1/3 typical symptoms suggestive of noncardiac chest pain.  -CXR unremarkable.   -Initial cardiac troponin negative.  -EKG not indicative of acute ischemia.  -HEART score is 5, >1% risk of MACE within 30 days  -GRACE score is 72; which predicts an in-hospital death rate of 0.3%.  -Will plan to place in observation status on telemetry to rule out ACS by overnight observation.  -cycle troponin q6h x 3 and repeat EKG in AM -Start ASA 81 mg daily -Given persistent CP, the patient was started on a NTG drip with improvement in BP and resolution of pain -Since she was started on NTG drip, she was also started on heparin drip pending cardiology evaluation -morphine given -Risk factor stratification with FLP; will also check TSH and UDS -Cardiology consultation - NPO for possible stress test   HTN -Takes Enalapril-HCTZ at home; this was continued -Patient with suboptimal control while in the ER -She was started on NTG drip, as above -Will also start low-dose metoprolol BID  HLD -Start Lipitor 20 mg daily -Lipids were checked- TC 189/HDL 62/LDL 120/TG 36    DVT prophylaxis: Heparin drip Code Status:  Full - confirmed with patient/family Family Communication: Sister and daughter were present throughout evaluation  Disposition Plan:  Home once clinically improved Consults called: Cardiology  Admission status: It is my clinical opinion that referral for OBSERVATION is reasonable and necessary in this patient based on the above information provided. The aforementioned taken together are felt to place the patient at high risk for further clinical deterioration.  However it is anticipated that the patient may be medically stable for  discharge from the hospital within 24 to 48 hours.    Jonah Blue MD [facility]  If note is complete, please contact covering daytime or nighttime physician. [email or web address] Password [email or web address]  09/24/2018, 2:42 PM

## 2018-09-24 NOTE — ED Notes (Signed)
Pt states pain is at a 3 at this time but does not want any more NTG at this time

## 2018-09-24 NOTE — Progress Notes (Signed)
ANTICOAGULATION CONSULT NOTE - Initial Consult  Pharmacy Consult for Heparin Indication: chest pain/ACS  Allergies  Allergen Reactions  . Adhesive [Tape] Hives    Patient Measurements: Height: 5\' 10"  (177.8 cm) Weight: 248 lb 4.8 oz (112.6 kg) IBW/kg (Calculated) : 68.5 Heparin Dosing Weight:  93.7 kg  Vital Signs: Temp: 98.2 F (36.8 C) (10/30 0954) Temp Source: Oral (10/30 0954) BP: 187/102 (10/30 0954) Pulse Rate: 58 (10/30 0800)  Labs: Recent Labs    09/24/18 0341 09/24/18 0657  HGB 13.1  --   HCT 41.0  --   PLT 268  --   CREATININE 0.64  --   TROPONINI <0.03 <0.03    Estimated Creatinine Clearance: 110.5 mL/min (by C-G formula based on SCr of 0.64 mg/dL).   Medical History: Past Medical History:  Diagnosis Date  . Anginal pain (HCC) 9/12   states negative cardiology workup exception of murmur  . Anxiety and depression   . Bony sclerosis 08/22/2011  . GERD (gastroesophageal reflux disease)   . Heart murmur   . Hypertension   . Lung nodule 08/24/2011   Assessment: CC/HPI: CP transferred from [facility] : [facility]  PMH: [place], anxiety, depression, bony sclerosis, GERD, HTN, lung nodule  Anticoag: Baseline CBC WNL.  Goal of Therapy:  Heparin level 0.3-0.7 units/ml Monitor platelets by anticoagulation protocol: Yes   Plan:  IV heparin bolus 4000 units Heparin infusion at 1300 units/hr Check heparin level in 6-8 hrs Daily HL and CBC   Ronan Duecker S. Merilynn Finland, PharmD, BCPS Clinical Staff Pharmacist Pager [phone]   Misty Stanley Stillinger 09/24/2018,11:57 AM

## 2018-09-25 ENCOUNTER — Observation Stay (HOSPITAL_COMMUNITY): Payer: BLUE CROSS/BLUE SHIELD

## 2018-09-25 DIAGNOSIS — I2510 Atherosclerotic heart disease of native coronary artery without angina pectoris: Secondary | ICD-10-CM

## 2018-09-25 DIAGNOSIS — R072 Precordial pain: Secondary | ICD-10-CM | POA: Diagnosis not present

## 2018-09-25 DIAGNOSIS — I10 Essential (primary) hypertension: Secondary | ICD-10-CM | POA: Diagnosis not present

## 2018-09-25 DIAGNOSIS — Z79899 Other long term (current) drug therapy: Secondary | ICD-10-CM | POA: Diagnosis not present

## 2018-09-25 DIAGNOSIS — R079 Chest pain, unspecified: Secondary | ICD-10-CM | POA: Diagnosis not present

## 2018-09-25 LAB — PROTIME-INR
INR: 1.01
Prothrombin Time: 13.2 seconds (ref 11.4–15.2)

## 2018-09-25 LAB — BASIC METABOLIC PANEL
ANION GAP: 11 (ref 5–15)
BUN: 7 mg/dL (ref 6–20)
CO2: 23 mmol/L (ref 22–32)
Calcium: 8.9 mg/dL (ref 8.9–10.3)
Chloride: 100 mmol/L (ref 98–111)
Creatinine, Ser: 0.62 mg/dL (ref 0.44–1.00)
GFR calc non Af Amer: >60 mL/min (ref >60)
GLUCOSE: 104 mg/dL — AB (ref 70–99)
POTASSIUM: 3.3 mmol/L — AB (ref 3.5–5.1)
Sodium: 134 mmol/L — ABNORMAL LOW (ref 135–145)

## 2018-09-25 LAB — ECHOCARDIOGRAM COMPLETE
Height: 70 in
Weight: 3972.8 oz

## 2018-09-25 LAB — CBC
HEMATOCRIT: 38 % (ref 36.0–46.0)
Hemoglobin: 12.1 g/dL (ref 12.0–15.0)
MCH: 27.3 pg (ref 26.0–34.0)
MCHC: 31.8 g/dL (ref 30.0–36.0)
MCV: 85.6 fL (ref 80.0–100.0)
NRBC: 0 % (ref 0.0–0.2)
Platelets: 268 10*3/uL (ref 150–400)
RBC: 4.44 MIL/uL (ref 3.87–5.11)
RDW: 12.6 % (ref 11.5–15.5)
WBC: 4.5 10*3/uL (ref 4.0–10.5)

## 2018-09-25 LAB — HIV ANTIBODY (ROUTINE TESTING W REFLEX): HIV SCREEN 4TH GENERATION: NONREACTIVE

## 2018-09-25 MED ORDER — METOPROLOL TARTRATE 25 MG PO TABS: 12.5000 mg | ORAL_TABLET | Freq: Two times a day (BID) | ORAL | 0 refills | Status: DC

## 2018-09-25 MED ORDER — IOPAMIDOL (ISOVUE-370) INJECTION 76%
100.0000 mL | Freq: Once | INTRAVENOUS | Status: AC | PRN
  Administered 2018-09-25: 100 mL via INTRAVENOUS

## 2018-09-25 MED ORDER — ENALAPRIL MALEATE 10 MG PO TABS: 10.0000 mg | ORAL_TABLET | Freq: Every day | ORAL | 0 refills | Status: DC

## 2018-09-25 MED ORDER — ATORVASTATIN CALCIUM 20 MG PO TABS: 20.0000 mg | ORAL_TABLET | Freq: Every day | ORAL | 0 refills | Status: DC

## 2018-09-25 MED ORDER — ASPIRIN 81 MG PO TBEC: 81.0000 mg | DELAYED_RELEASE_TABLET | Freq: Every day | ORAL | 0 refills | Status: DC

## 2018-09-25 MED ORDER — POTASSIUM CHLORIDE CRYS ER 20 MEQ PO TBCR
40.0000 meq | EXTENDED_RELEASE_TABLET | Freq: Once | ORAL | Status: AC
  Administered 2018-09-25: 40 meq via ORAL
  Filled 2018-09-25: qty 2

## 2018-09-25 NOTE — Progress Notes (Signed)
Progress Note  Patient Name: Debra Carpenter Date of Encounter: 09/25/2018  Primary Cardiologist: Verne Carrow, MD   Subjective   No chest pain this am. No dyspnea. Feels better  Inpatient Medications    Scheduled Meds: . aspirin EC  81 mg Oral Daily  . atorvastatin  20 mg Oral q1800  . enalapril  5 mg Oral Daily   And  . hydrochlorothiazide  12.5 mg Oral Daily  . metoprolol tartrate  12.5 mg Oral BID  . sodium chloride flush  3 mL Intravenous Q12H   Continuous Infusions: . sodium chloride    . nitroGLYCERIN 15 mcg/min (09/24/18 1718)   PRN Meds: sodium chloride, acetaminophen, morphine injection, ondansetron (ZOFRAN) IV, sodium chloride flush   Vital Signs    Vitals:   09/24/18 1803 09/24/18 1958 09/24/18 2300 09/25/18 0648  BP: 131/80 128/69 119/73 102/65  Pulse:  64 (!) 58 (!) 50  Resp:    17  Temp:  98.6 F (37 C)  97.6 F (36.4 C)  TempSrc:  Oral  Oral  SpO2:  99% 100% 98%  Weight:    111.2 kg  Height:        Intake/Output Summary (Last 24 hours) at 09/25/2018 0704 Last data filed at 09/25/2018 1610 Gross per 24 hour  Intake 383.53 ml  Output -  Net 383.53 ml   Filed Weights   09/24/18 0336 09/24/18 0945 09/25/18 0648  Weight: 113.4 kg 112.6 kg 111.2 kg    Telemetry    Sinus brady - Personally Reviewed  ECG    Non-specific ST and T wave abn- Personally Reviewed  Physical Exam   GEN: No acute distress.   Neck: No JVD Cardiac: RRR, no murmurs, rubs, or gallops.  Respiratory: Clear to auscultation bilaterally. GI: Soft, nontender, non-distended  MS: No edema; No deformity. Neuro:  Nonfocal  Psych: Normal affect   Labs    Chemistry Recent Labs  Lab 09/24/18 0341 09/25/18 0427  NA 139 134*  K 3.6 3.3*  CL 104 100  CO2 27 23  GLUCOSE 90 104*  BUN 12 7  CREATININE 0.64 0.62  CALCIUM 9.0 8.9  GFRNONAA >60 >60  GFRAA >60 >60  ANIONGAP 8 11     Hematology Recent Labs  Lab 09/24/18 0341 09/25/18 0427  WBC 5.2 4.5    RBC 4.67 4.44  HGB 13.1 12.1  HCT 41.0 38.0  MCV 87.8 85.6  MCH 28.1 27.3  MCHC 32.0 31.8  RDW 13.0 12.6  PLT 268 268    Cardiac Enzymes Recent Labs  Lab 09/24/18 0341 09/24/18 0657 09/24/18 1153  TROPONINI <0.03 <0.03 <0.03   No results for input(s): TROPIPOC in the last 168 hours.   BNPNo results for input(s): BNP, PROBNP in the last 168 hours.   DDimer No results for input(s): DDIMER in the last 168 hours.   Radiology    Dg Chest 2 View  Result Date: 09/24/2018 CLINICAL DATA:  52 year old female with chest pain. EXAM: CHEST - 2 VIEW COMPARISON:  Chest radiograph dated 07/16/2013 FINDINGS: The heart size and mediastinal contours are within normal limits. Both lungs are clear. The visualized skeletal structures are unremarkable. IMPRESSION: No active cardiopulmonary disease. Electronically Signed   By: Elgie Collard M.D.   On: 09/24/2018 04:50    Cardiac Studies     Patient Profile     52 y.o. female with history of HTN admitted with chest pain.   Assessment & Plan    1. Chest pain: Her  pain is atypical. Echo shows normal LV systolic function. Plans for coronary CTA today. If her coronary CTA has no obstructive CAD, she could be discharged home later today  For questions or updates, please contact [facility] Please consult [email or web address] for contact info under        Signed, Verne Carrow, MD  09/25/2018, 7:04 AM

## 2018-09-25 NOTE — Discharge Summary (Signed)
Physician Discharge Summary  Patient ID: Debra Carpenter MRN: [id number] DOB/AGE: 1966/06/28 52 y.o.  Admit date: 09/24/2018 Discharge date: 09/25/2018  Admission Diagnoses:  Discharge Diagnoses:  Principal Problem:   Chest pain at rest Active Problems:   Essential hypertension   Discharged Condition: stable  Hospital Course: Patient is a 52 year old African-American female, with past medical history significant for hypertension, GERD, anginal pain, anxiety and depression.  Patient was admitted with chest pain.  Patient was admitted for further assessment and management.  Cardiac enzymes were cycled and they came back negative.  Cardiology team was consulted, and they directed patient's chest pain management.  Patient eventually underwent coronary CT scan that was said to be within normal range.  Patient has been cleared for discharge by the cardiology team.  Patient will follow-up with the primary care provider and cardiology team within 1 week.  Consults: cardiology  Significant Diagnostic Studies: Coronary CT  Discharge Exam: Blood pressure 110/75, pulse (!) 55, temperature 98.3 F (36.8 C), temperature source Oral, resp. rate 17, height 5\' 10"  (1.778 m), weight 111.2 kg, SpO2 99 %.   Disposition: Discharge disposition: 01-Home or Self Care   Discharge Instructions    Call MD for:   Complete by:  As directed    Call MD if you develop chest pain.   Diet - low sodium heart healthy   Complete by:  As directed    Increase activity slowly   Complete by:  As directed      Allergies as of 09/25/2018      Reactions   Adhesive [tape] Hives      Medication List    STOP taking these medications   acetaminophen 650 MG CR tablet Commonly known as:  TYLENOL   Enalapril-hydroCHLOROthiazide 5-12.5 MG tablet     TAKE these medications   aspirin 81 MG EC tablet Take 1 tablet (81 mg total) by mouth daily. Start taking on:  09/26/2018   atorvastatin 20 MG tablet Commonly known  as:  LIPITOR Take 1 tablet (20 mg total) by mouth daily at 6 PM.   cetirizine 10 MG tablet Commonly known as:  ZYRTEC Take 10 mg by mouth daily as needed for allergies.   enalapril 10 MG tablet Commonly known as:  VASOTEC Take 1 tablet (10 mg total) by mouth daily. Start taking on:  09/26/2018   metoprolol tartrate 25 MG tablet Commonly known as:  LOPRESSOR Take 0.5 tablets (12.5 mg total) by mouth 2 (two) times daily.        SignedBarnetta Chapel 09/25/2018, 5:57 PM

## 2018-10-03 ENCOUNTER — Encounter: Payer: Self-pay | Admitting: Cardiology

## 2018-10-06 ENCOUNTER — Ambulatory Visit: Payer: BLUE CROSS/BLUE SHIELD | Admitting: Cardiology

## 2018-10-06 ENCOUNTER — Encounter: Payer: Self-pay | Admitting: Cardiology

## 2018-10-06 VITALS — BP 120/90 | HR 69 | Ht 70.0 in | Wt 250.1 lb

## 2018-10-06 DIAGNOSIS — I10 Essential (primary) hypertension: Secondary | ICD-10-CM | POA: Diagnosis not present

## 2018-10-06 MED ORDER — ASPIRIN 81 MG PO TBEC: 81.0000 mg | DELAYED_RELEASE_TABLET | Freq: Every day | ORAL | 0 refills | Status: DC

## 2018-10-06 MED ORDER — ATORVASTATIN CALCIUM 10 MG PO TABS: 10.0000 mg | ORAL_TABLET | Freq: Every day | ORAL | 3 refills | Status: DC

## 2018-10-06 MED ORDER — SPIRONOLACTONE 25 MG PO TABS: 25.0000 mg | ORAL_TABLET | Freq: Every day | ORAL | 3 refills | Status: DC

## 2018-10-06 NOTE — Progress Notes (Signed)
10/06/2018 Debra Carpenter   02-Jun-1966  [id number]  Primary Physician Henri Medal, MD Primary Cardiologist: Dr. Clifton James   Reason for Visit/CC: [facility] F/u for Chest Pain   HPI:  Debra Carpenter is a 52 y.o. AA female who is being seen today for post hospital f/u after recent admission for CP.   She has a PMH hx of HTN, GERD and family h/o CAD. Pt reports the was seen by a cardiologist several years ago for cardiac evaluation. She recalls having a stress test and echo and was told studies were normal.   Her cardiac risk factors include HTN. Prior to admission, pt was  on medications (ACEi, thiazide and ? blocker) and reported that BP had been controlled prior to recently. She also has a FH of CAD. Her mother has 7 stents, a PPM and afib. She was first diagnosed with CAD in her 23s. Her father also has CAD and had a MI in his 10s. Her paternal grandfather died suddenly from a MI in his 58s. She denies any personal h/o DM and HLD. No tobacco history.   She presented to [facility] on 09/24/18 with CC of CP that woke her up from her sleep around 2AM. No relief with OTC antacids. Atypical in that it was not any worse with physical activity. No associated dyspnea.  In the ED, she was markedly hypertensive and started on IV nitro. Initial troponin was negative and EKG showed no ischemic abnormalities. She was admitted by IM for management of her BP. Cardiac enzymes were cycled and her troponin's were negative x 3. Cardiology was consulted. We recommended a coronary CTA and 2D echo. Both studies were unremarkable. Her coronary CTA showed no significant stenosis. Calcium score was 0. 2D echo showed normal normal LVEF and normal wall motion. No valvular abnormalities. Her BP regimen was adjusted. Enalapril dose increased to 10 mg. Metoprolol was continued.  BP improved and CP resolved. She was discharge home and instructed to f/u with cardiology.   She is here today for f/u. BP is better controlled at  120/90 today in clinic, consistent w/ home readings. Her diastolic BPs have been running high. She denies any recurrent CP. No dyspnea.   After leaving the hospital it appears she had a reaction c/w angioedema. Pt reports that at time of discharge, her meds were adjusted. She was taken off of the combo enalapril-HCTZ and prescribed just enalapril 10 mg. When she had f/u with her PCP, she was placed back on the combo pill with HCTZ. The following day, she had severe swelling of her face and lips. She self medicated herself with benadryl x 2 and the swelling resolved. She denies any recurrence.    Cardiac Studies  Coronary CTA 09/25/18 Coronary arteries: Normal coronary origins.  Right dominance.  Right Coronary Artery: No significant plaque or stenosis. Motion artifact degrades the assessment of the mid-RCA, however assessment in best systolic phase suggests no significant stenosis. Possible tortuosity in the mid RCA. Cannot exclude proximal occlusion of PDA, however this may also result from motion artifact, since distal vessel is widely patent. PL branch is patent but small caliber.  Left Main Coronary Artery: No significant plaque or stenosis.  Left Anterior Descending Coronary Artery: Minimal tubular stenosis of the proximal LAD, <25% stenosis. Mid and distal vessel are widely patent with tortuosity. Patent diagonal branches and septal perforators.  Left Circumflex Artery: No detectable plaque or stenosis. Small caliber vessel. OM1 is patent but small.  Aorta:  Normal  size.  No calcifications.  No dissection.  Aortic Valve: No calcifications. Morphology cannot be assessed due to inability to visualize valve opening in systole.  Other findings:  Normal pulmonary vein drainage into the left atrium.  Normal let atrial appendage without a thrombus.  Normal size of the main pulmonary artery, mildly dilated left pulmonary artery.  IMPRESSION: 1. Coronary calcium  score of 0. This was 0 percentile for age and sex matched control.  2. Normal coronary origin with right dominance.  3. Minimal CAD in the proximal LAD, CADRADS = 1. Tortuosity of the LAD and likely RCA. Canont exclude mid-RCA or proximal PDA stenosis, however, this is more likely secondary to motion artifact given the patent distal vessel.  2D Echo 09/24/18 Left ventricle:  The cavity size was normal. Systolic function was normal. The estimated ejection fraction was in the range of 60% to 65%. Wall motion was normal; there were no regional wall motion abnormalities. The transmitral flow pattern was normal. The deceleration time of the early transmitral flow velocity was normal. The pulmonary vein flow pattern was normal. The tissue Doppler parameters were normal. Left ventricular diastolic function parameters were normal.   Current Meds  Medication Sig  . aspirin 81 MG EC tablet Take 1 tablet (81 mg total) by mouth daily.  . cetirizine (ZYRTEC) 10 MG tablet Take 10 mg by mouth daily as needed for allergies.  . metoprolol tartrate (LOPRESSOR) 25 MG tablet Take 0.5 tablets (12.5 mg total) by mouth 2 (two) times daily.  . [DISCONTINUED] aspirin EC 81 MG EC tablet Take 1 tablet (81 mg total) by mouth daily.  . [DISCONTINUED] atorvastatin (LIPITOR) 20 MG tablet Take 1 tablet (20 mg total) by mouth daily at 6 PM.  . [DISCONTINUED] enalapril-hydrochlorothiazide (VASERETIC) 10-25 MG tablet Take 1 tablet by mouth daily.   Allergies  Allergen Reactions  . Ace Inhibitors Anaphylaxis    Angioedema   . Adhesive [Tape] Hives   Past Medical History:  Diagnosis Date  . Anginal pain (HCC) 9/12   states negative cardiology workup exception of murmur  . Anxiety and depression   . Bony sclerosis 08/22/2011  . GERD (gastroesophageal reflux disease)   . Heart murmur   . Hypertension   . Lung nodule 08/24/2011   Family History  Problem Relation Age of Onset  . Hypertension Mother   .  Hypertension Father   . Cancer Father        ? colon cancer  . Cancer Maternal Grandmother        ovarian cancer  . Hypertension Maternal Grandfather   . Hypertension Paternal Grandfather    Past Surgical History:  Procedure Laterality Date  . ABDOMINAL HYSTERECTOMY  2007  . LAPAROSCOPIC GASTRIC BANDING  2011  . LAPAROSCOPIC GASTRIC SLEEVE RESECTION N/A 07/21/2013   Procedure: LAPAROSCOPIC GASTRIC SLEEVE RESECTION, LYSIS OF ADHESIONS, EGD;  Surgeon: Lodema Pilot, DO;  Location: [facility];  Service: General;  Laterality: N/A;  . LAPAROSCOPIC LYSIS OF ADHESIONS  07/21/2013   Procedure: LAPAROSCOPIC LYSIS OF ADHESIONS;  Surgeon: Lodema Pilot, DO;  Location: [facility];  Service: General;;  . LAPAROSCOPIC REPAIR AND REMOVAL OF GASTRIC BAND N/A 05/05/2013   Procedure: LAPAROSCOPIC REMOVAL OF ADJUSTABLE GASTRIC BAND AND PORT, ENDOSCOPY ;  Surgeon: Lodema Pilot, DO;  Location: [facility];  Service: General;  Laterality: N/A;  laparoscopic removal of adjustable gastric band AND PORT   . UPPER GI ENDOSCOPY  07/21/2013   Procedure: UPPER GI ENDOSCOPY;  Surgeon: Lodema Pilot, DO;  Location: [facility];  Service: General;;   Social History   Socioeconomic History  . Marital status: Married    Spouse name: Not on file  . Number of children: 3  . Years of education: Not on file  . Highest education level: Not on file  Occupational History  . 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 Financial resource strain: Not on file  . Food insecurity:    Worry: Not on file    Inability: Not on file  . Transportation needs:    Medical: Not on file    Non-medical: Not on file  Tobacco Use  . Smoking status: Never Smoker  . Smokeless tobacco: Never Used  Substance and Sexual Activity  . Alcohol use: No  . Drug use: No  . Sexual activity: Not Currently    Birth control/protection: Surgical  Lifestyle  . Physical activity:    Days per week: Not on file    Minutes per session: Not  on file  . Stress: Not on file  Relationships  . Social connections:    Talks on phone: Not on file    Gets together: Not on file    Attends religious service: Not on file    Active member of club or organization: Not on file    Attends meetings of clubs or organizations: Not on file    Relationship status: Not on file  . Intimate partner violence:    Fear of current or ex partner: Not on file    Emotionally abused: Not on file    Physically abused: Not on file    Forced sexual activity: Not on file  Other Topics Concern  . 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     3 children- 54 daughter, 63 son, 8 daughter                 Review of Systems: General: negative for chills, fever, night sweats or weight changes.  Cardiovascular: negative for chest pain, dyspnea on exertion, edema, orthopnea, palpitations, paroxysmal nocturnal dyspnea or shortness of breath Dermatological: negative for rash Respiratory: negative for cough or wheezing Urologic: negative for hematuria Abdominal: negative for nausea, vomiting, diarrhea, bright red blood per rectum, melena, or hematemesis Neurologic: negative for visual changes, syncope, or dizziness All other systems reviewed and are otherwise negative except as noted above.   Physical Exam:  Blood pressure 120/90, pulse 69, height 5\' 10"  (1.778 m), weight 250 lb 1.9 oz (113.5 kg), SpO2 98 %.  General appearance: alert, cooperative, no distress and moderately obese Neck: no carotid bruit and no JVD Lungs: clear to auscultation bilaterally Heart: regular rate and rhythm, S1, S2 normal, no murmur, click, rub or gallop Extremities: extremities normal, atraumatic, no cyanosis or edema Pulses: 2+ and symmetric Skin: Skin color, texture, turgor normal. No rashes or lesions Neurologic: Grossly normal  EKG not performed --  personally reviewed   ASSESSMENT AND PLAN:   1. Chest Pain: recent cardiac evaluation for CP in the hospital was unremarkable. She ruled out for MI with negative troponins x 3. Coronary CTA showed no significant CAD and calcium score of 0. 2D Echo showed normal LVEF and wall motion. No significant valve abnormalities. Suspect her CP was related to elevated HTN, which has improved. She denies any recurrent CP.  Continue management of cardiac risk factors for primary prevention of CAD.   2. HTN: better improved compared to level at time of hospital admission, however her diastolic BPs have been running high in the 90s. Given her recent angioedema with enalapril we will discontinue her enalapril-HCTZ. I have reviewed pt case and vital signs with Dr. Elease Hashimoto, DOD. Recent labs showed normal SCr but low normal K at 3.3. She is AA. We think she would benefit from spironolactone, 25 mg daily. Continue metoprolol 12.5 mg BID for now. Stop enalapril-hctz. Return in 1 week for f/u BMP to ensure stable renal function and K. F/u in HTN clinic in 2 weeks for repeat BP check and further med titration. If BP remains elevated, recommend considering changing  blocker from metoprolol to Coreg.   3. Angioedema: treated w/ benadryl. We will STOP enalapril. ACEi has been added to allergy list. Pt advised to notify other providers of reaction to avoid further prescription of an ACE/ARB in the future. Pt instructed to seek emergency medical attention instead of trying to self medicate any allergic reactions/ severe swelling involving the face and neck, if this happens again. She verbalized understanding.   4. HLD:  Coronary CTA showed no significant CAD and calcium score of 0. However she reports prior h/o TIA and has multiple CV risk factors. Her recent lipid panel showed elevated LDL at 120 mg/dL. We will therefore continue statin therapy w/ Lipitor 10 mg.   Follow-Up in HTN clinic in 2 weeks and w/ Dr. Clifton James in 6 months.     Debra Carpenter Debra Carpenter, MHS [facility] 10/06/2018 12:36 PM

## 2018-10-06 NOTE — Patient Instructions (Addendum)
Medication Instructions:  Your physician has recommended you make the following change in your medication:  1. START Spironolactone 25 mg Daily.  2. DECREASE  Lipitor to 10 mg Daily.  3. STOP ENALAPRIL-HCTZ   If you need a refill on your cardiac medications before your next appointment, please call your pharmacy.   Lab work: LABS TO BE DONE IN 1 WEEK: BMET If you have labs (blood work) drawn today and your tests are completely normal, you will receive your results only by: Marland Kitchen MyChart Message (if you have MyChart) OR . A paper copy in the mail If you have any lab test that is abnormal or we need to change your treatment, we will call you to review the results.  Testing/Procedures: NONE  Follow-Up: Your physician recommends that you schedule a follow-up appointment in: 2 WEEKS WITH HYPERTENSION CLINIC.  Your physician recommends that you schedule a follow-up appointment in: 6 MONTHS WITH DR. Clifton James. You will receive a letter in the mail two months before to call office and schedule your appointment.  Any Other Special Instructions Will Be Listed Below (If Applicable).

## 2018-10-13 ENCOUNTER — Other Ambulatory Visit: Payer: BLUE CROSS/BLUE SHIELD

## 2018-10-15 ENCOUNTER — Other Ambulatory Visit: Payer: BLUE CROSS/BLUE SHIELD | Admitting: *Deleted

## 2018-10-15 DIAGNOSIS — I10 Essential (primary) hypertension: Secondary | ICD-10-CM

## 2018-10-15 LAB — BASIC METABOLIC PANEL
BUN/Creatinine Ratio: 15 (ref 9–23)
BUN: 9 mg/dL (ref 6–24)
CALCIUM: 9.1 mg/dL (ref 8.7–10.2)
CO2: 23 mmol/L (ref 20–29)
Chloride: 102 mmol/L (ref 96–106)
Creatinine, Ser: 0.62 mg/dL (ref 0.57–1.00)
GFR calc non Af Amer: 103 mL/min/{1.73_m2} (ref >59)
GFR, EST AFRICAN AMERICAN: 119 mL/min/{1.73_m2} (ref >59)
GLUCOSE: 75 mg/dL (ref 65–99)
Potassium: 3.7 mmol/L (ref 3.5–5.2)
Sodium: 140 mmol/L (ref 134–144)

## 2018-10-21 ENCOUNTER — Ambulatory Visit (INDEPENDENT_AMBULATORY_CARE_PROVIDER_SITE_OTHER): Payer: BLUE CROSS/BLUE SHIELD | Admitting: Pharmacist

## 2018-10-21 VITALS — BP 154/84 | HR 52

## 2018-10-21 DIAGNOSIS — G459 Transient cerebral ischemic attack, unspecified: Secondary | ICD-10-CM | POA: Diagnosis not present

## 2018-10-21 DIAGNOSIS — I10 Essential (primary) hypertension: Secondary | ICD-10-CM | POA: Diagnosis not present

## 2018-10-21 MED ORDER — ROSUVASTATIN CALCIUM 20 MG PO TABS: 20.0000 mg | ORAL_TABLET | Freq: Every day | ORAL | 11 refills | Status: DC

## 2018-10-21 MED ORDER — SPIRONOLACTONE 50 MG PO TABS: 50.0000 mg | ORAL_TABLET | Freq: Every day | ORAL | 11 refills | Status: DC

## 2018-10-21 NOTE — Patient Instructions (Addendum)
It was nice to meet you  Increase your spironolactone to 50mg  once daily  Stop your metoprolol  Stop your atorvastatin  Start rosuvastatin 20mg  once daily  You had a coronary CTA and calcium score in the hospital - both were normal with no plaque build up  Limit the sodium in your diet to < 2,000mg  each day  Your blood pressure goal is < 130/580mmHg - keep monitoring your readings at home  Follow up in clinic in 3 weeks for a blood pressure check and lab work

## 2018-10-21 NOTE — Progress Notes (Signed)
Patient ID: Debra Carpenter                 DOB: [birth year]                      MRN: [id number]     HPI: Debra Carpenter is a pleasant 52 y.o. female patient of Dr Clifton James referred by Robbie Lis, PA to HTN clinic. PMH is significant for HTN, chest pain, GERD, and family history of CAD. She presented to [facility] on 09/24/18 with chest pain. Troponins were negative and EKG showed no abnormalities. Coronary CTA showed no significant stenosis, calcium score was 0, and LVEF was normal. She was hypertensive during admission and chest pain resolved with improvement of BP. Enalapril was discontinued due to angioedema (severe swelling of lips and face). She was seen by Brittainy 3 weeks ago and BP was elevated at 120/90. She was started on spironolactone 25mg  daily to help with BP and low K of 3.3. Follow up BMET normal with improved K to 3.7. Pt presents today for follow up.  Pt presents today in good spirits. She reports tolerating her spironolactone well. Denies dizziness and falls. She experiences occasional headaches. Checks her BP a few times throughout the day - she uses a wrist cuff at home and a bicep cuff at work. She brings both cuffs to clinic today and they measure close to clinic reading. Lowest home reading 130/85, highest 175/101. Does report some fatigue - HR in the 50s at home since being discharged on metoprolol.  Reports she experienced a TIA in January and her PCP wanted her to have follow up carotid scan at [facility]. PMH updated to include TIA. While in the hospital she did undergo coronary CTA and calcium score but not a carotid scan. She wonders if her headaches may be related to carotids. She states her PCP started her on ASA 81mg  daily and atorvastatin 10mg  daily in January. SHe was discharged from the hospital on atorvastatin 20mg  daily, but refill sent in at last [facility] visit was for lower dose of 10mg . Given TIA and strong family history of premature CAD, should be targeting LDL < 70.  Most recent LDL was 120. She does report some aches in her legs that she wonders may be related to her atorvastatin. They started around the same time she began therapy earlier this year.  Current HTN meds: metoprolol tartrate 12.5mg  BID, spironolactone 25mg  daily Previously tried: enalapril - angioedema, amlodipine - LEE BP goal: <130/53mmHg  Family History: Mother with 7 stents, PPM, CAD in her 71s, and afib. Father with CAD and MI in his 15s. Paternal grandfather died from MI in his 27s.  Social History: Denies tobacco, alcohol, and illicit drug use.  Diet: Decreasing bread and rice. Cheerios or yogurt for breakfast. Eating more chicken and fish. Drinks juice and water.  Exercise: Likes walking, jogging, elliptical, and yoga classes. Has a membership at [employer].  Home BP readings:   Wt Readings from Last 3 Encounters:  10/06/18 250 lb 1.9 oz (113.5 kg)  09/25/18 245 lb 1.6 oz (111.2 kg)  09/16/18 251 lb (113.9 kg)   BP Readings from Last 3 Encounters:  10/06/18 120/90  09/25/18 110/75  09/16/18 118/82   Pulse Readings from Last 3 Encounters:  10/06/18 69  09/25/18 (!) 55  09/16/18 69    Renal function: CrCl cannot be calculated (Unknown ideal weight.).  Past Medical History:  Diagnosis Date  . Anginal pain (HCC) 9/12  states negative cardiology workup exception of murmur  . Anxiety and depression   . Bony sclerosis 08/22/2011  . GERD (gastroesophageal reflux disease)   . Heart murmur   . Hypertension   . Lung nodule 08/24/2011    Current Outpatient Medications on File Prior to Visit  Medication Sig Dispense Refill  . aspirin 81 MG EC tablet Take 1 tablet (81 mg total) by mouth daily. 30 tablet 0  . atorvastatin (LIPITOR) 10 MG tablet Take 1 tablet (10 mg total) by mouth daily. 90 tablet 3  . cetirizine (ZYRTEC) 10 MG tablet Take 10 mg by mouth daily as needed for allergies.    . metoprolol tartrate (LOPRESSOR) 25 MG tablet Take 0.5 tablets (12.5 mg total) by mouth  2 (two) times daily. 60 tablet 0  . spironolactone (ALDACTONE) 25 MG tablet Take 1 tablet (25 mg total) by mouth daily. 90 tablet 3   No current facility-administered medications on file prior to visit.     Allergies  Allergen Reactions  . Ace Inhibitors Anaphylaxis    Angioedema   . Adhesive [Tape] Hives     Assessment/Plan:  1. Hypertension - BP remains above goal <130/5080mmHg. Will discontinue metoprolol due to lack of indication for beta blocker therapy, pt fatigue, bradycardia with HR 52 today, also not optimal HTN treatment. Will increase spironolactone to 50mg  daily. Prior angioedema with ACEi and LEE with amlodipine. F/u in clinic in 3 weeks for BP check and BMET.  2. Hyperlipidemia - LDL 120 above goal < 70 due to hx of TIA and strong family history of premature CAD. PCP started pt on atorvastatin 10mg  daily in January 2019 after her TIA, she was discharged on 20mg  daily after October 2019 hospitalization, refill at follow up visit was sent in for lower dose of 10mg  daily. Pt needs high intensity statin therapy due to history of TIA. Will stop atorvastatin due to leg pain and start rosuvastatin 20mg  daily. Assess tolerability at f/u visit with plan to recheck lipids in 3 months if tolerating well.  Advised pt to f/u with PCP regarding headaches over the past few weeks. F/u in pharmacy clinic in 3 weeks.   Megan E. Supple, PharmD, BCACP, CPP [facility] [address], [place], [place] [address] Phone: [phone]) [phone]; Fax: [phone] 10/21/2018 1:22 PM

## 2018-11-07 ENCOUNTER — Telehealth: Payer: Self-pay | Admitting: Cardiology

## 2018-11-07 DIAGNOSIS — I10 Essential (primary) hypertension: Secondary | ICD-10-CM

## 2018-11-07 NOTE — Telephone Encounter (Signed)
Patient is going to be out of town, and she can't make to her 12/18 appt with the pharmacist and rescheduled it to 1/3.  She is wondering if she can come in before 1/3 just for blood work, and then still come in on 1/3 to speak to the pharmacist. Patient can come in this Monday morning 12/16. Please advise.

## 2018-11-07 NOTE — Telephone Encounter (Signed)
Yes this is fine to have labs on Monday and visit in January

## 2018-11-10 ENCOUNTER — Telehealth: Payer: Self-pay

## 2018-11-10 ENCOUNTER — Other Ambulatory Visit: Payer: BLUE CROSS/BLUE SHIELD | Admitting: *Deleted

## 2018-11-10 DIAGNOSIS — I10 Essential (primary) hypertension: Secondary | ICD-10-CM

## 2018-11-10 LAB — BASIC METABOLIC PANEL
BUN/Creatinine Ratio: 16 (ref 9–23)
BUN: 10 mg/dL (ref 6–24)
CALCIUM: 9.5 mg/dL (ref 8.7–10.2)
CO2: 25 mmol/L (ref 20–29)
Chloride: 100 mmol/L (ref 96–106)
Creatinine, Ser: 0.61 mg/dL (ref 0.57–1.00)
GFR, EST AFRICAN AMERICAN: 120 mL/min/{1.73_m2} (ref >59)
GFR, EST NON AFRICAN AMERICAN: 104 mL/min/{1.73_m2} (ref >59)
Glucose: 88 mg/dL (ref 65–99)
POTASSIUM: 4.5 mmol/L (ref 3.5–5.2)
SODIUM: 139 mmol/L (ref 134–144)

## 2018-11-10 NOTE — Telephone Encounter (Signed)
Spoke to patient and informed her that she may come in today 12/16 for BMET.  She verbalized understanding.

## 2018-11-10 NOTE — Telephone Encounter (Signed)
Labs ordered.

## 2018-11-12 ENCOUNTER — Ambulatory Visit: Payer: BLUE CROSS/BLUE SHIELD

## 2018-11-13 ENCOUNTER — Telehealth: Payer: Self-pay

## 2018-11-13 NOTE — Telephone Encounter (Signed)
-----   Message from Allayne ButcherBrittainy M Simmons, [place] sent at 11/13/2018  4:57 PM EST ----- Kidney function and potassium levels are stable on current meds. Continue current regimen.

## 2018-11-13 NOTE — Telephone Encounter (Signed)
Notes recorded by Sigurd Sosapp, Kimberlea Schlag, RN on 11/13/2018 at 5:01 PM EST Left patient message with results. 12/19 ------

## 2018-11-17 ENCOUNTER — Telehealth: Payer: Self-pay

## 2018-11-17 NOTE — Telephone Encounter (Signed)
The patient has been notified of the result and verbalized understanding.  All questions (if any) were answered. Sigurd SosMichael  Adewale Pucillo, RN 11/17/2018 8:53 AM

## 2018-11-28 ENCOUNTER — Ambulatory Visit (INDEPENDENT_AMBULATORY_CARE_PROVIDER_SITE_OTHER): Payer: BLUE CROSS/BLUE SHIELD | Admitting: Pharmacist

## 2018-11-28 VITALS — BP 130/74 | HR 70

## 2018-11-28 DIAGNOSIS — I10 Essential (primary) hypertension: Secondary | ICD-10-CM

## 2018-11-28 DIAGNOSIS — E785 Hyperlipidemia, unspecified: Secondary | ICD-10-CM | POA: Insufficient documentation

## 2018-11-28 DIAGNOSIS — E782 Mixed hyperlipidemia: Secondary | ICD-10-CM | POA: Diagnosis not present

## 2018-11-28 NOTE — Patient Instructions (Addendum)
It was nice to see you today  Continue taking your spironolactone 50mg  once daily  Continue to monitor your blood pressure - your goal is < 130/69mmHg  Start taking your rosuvastatin for your cholesterol each day  Follow up in clinic in 2 months for a blood pressure check and cholesterol check.  Call [facility] if your blood pressures stay elevated above goal before then [phone]

## 2018-11-28 NOTE — Progress Notes (Signed)
Patient ID: Debra Carpenter                 DOB: November 01, 1966                      MRN: [id number]     HPI: Debra Carpenter is a pleasant 53 y.o. female patient of Dr Clifton James referred by Robbie Lis, PA to HTN clinic. PMH is significant for HTN, chest pain, GERD, TIA, and family history of CAD. She presented to [facility] on 09/24/18 with chest pain. Troponins were negative and EKG showed no abnormalities. Coronary CTA showed no significant stenosis, calcium score was 0, and LVEF was normal. She was hypertensive during admission and chest pain resolved with improvement of BP. Enalapril was discontinued due to angioedema (severe swelling of lips and face). At last visit in pharmacy clinic, spironolactone was increased to 50mg  daily with f/u BMET stable. Her atorvastatin was also changed to higher dose of rosuvastatin given history of TIA.  Pt presents today in good spirits. She reports tolerating the higher dose of her spironolactone well. Has noticed that she became off balance once or twice, however no overt dizziness. Denies headache or blurred vision. She has been monitoring her BP at home - systolic has mostly ranged 140-180, on the higher end while she has been sick and using Advil sinus (contains NSAID and Sudafed, both of which can increase BP). Lowest reading seen was 128/70. She took her spironolactone this morning.  She did not start taking her rosuvastatin as instructed at last visit. She feels as though her cholesterol is not too high. Her leg pain did resolve after discontinuing her atorvastatin. Discussed benefit of targeting LDL < 70 due to history of TIAs and strong family history of CAD.  Current HTN meds: spironolactone 50mg  daily Previously tried: enalapril - angioedema, amlodipine - LEE BP goal: <130/49mmHg  Current lipid meds: none Previously tried: atorvastatin 10mg  and 20mg  - myalgias LDL goal: < 70 due to TIA and strong family history of CAD  Family History: Mother with 7 stents, PPM,  CAD in her 58s, and afib. Father with CAD and MI in his 4s. Paternal grandfather died from MI in his 4s.  Social History: Denies tobacco, alcohol, and illicit drug use.  Diet: Decreasing bread and rice. Cheerios or yogurt for breakfast. Eating more chicken and fish. Drinks juice and water.  Exercise: Likes walking, jogging, elliptical, and yoga classes. Has a membership at [employer].  Home BP readings: 140-180 systolic, low of 295/28  Wt Readings from Last 3 Encounters:  10/06/18 250 lb 1.9 oz (113.5 kg)  09/25/18 245 lb 1.6 oz (111.2 kg)  09/16/18 251 lb (113.9 kg)   BP Readings from Last 3 Encounters:  10/21/18 (!) 154/84  10/06/18 120/90  09/25/18 110/75   Pulse Readings from Last 3 Encounters:  10/21/18 (!) 52  10/06/18 69  09/25/18 (!) 55    Renal function: CrCl cannot be calculated (Unknown ideal weight.).  Past Medical History:  Diagnosis Date  . Anginal pain (HCC) 9/12   states negative cardiology workup exception of murmur  . Anxiety and depression   . Bony sclerosis 08/22/2011  . GERD (gastroesophageal reflux disease)   . Heart murmur   . Hypertension   . Lung nodule 08/24/2011    Current Outpatient Medications on File Prior to Visit  Medication Sig Dispense Refill  . aspirin 81 MG EC tablet Take 1 tablet (81 mg total) by mouth daily. 30 tablet 0  .  cetirizine (ZYRTEC) 10 MG tablet Take 10 mg by mouth daily as needed for allergies.    . rosuvastatin (CRESTOR) 20 MG tablet Take 1 tablet (20 mg total) by mouth daily. 30 tablet 11  . spironolactone (ALDACTONE) 50 MG tablet Take 1 tablet (50 mg total) by mouth daily. 30 tablet 11   No current facility-administered medications on file prior to visit.     Allergies  Allergen Reactions  . Ace Inhibitors Anaphylaxis    Angioedema   . Adhesive [Tape] Hives  . Amlodipine     LEE     Assessment/Plan:  1. Hypertension - BP much improved and now at goal <130/72mmHg in clinic, even despite use of cough and cold  products that can increase BP. Will continue spironolactone 50mg  daily, BMET stable. Advised pt to continue to monitor BP at home as her home readings were higher than clinic today. F/u in clinic in 2 months for BP check and BMET. Advised pt to bring in her home BP cuff and readings to f/u visit.  2. Hyperlipidemia - LDL 120 above goal < 70 due to hx of TIA and strong family history of premature CAD. Pt did not start rosuvastatin as instructed at last visit. Again discussed the benefit of treating her cholesterol aggressively and she is willing to start rosuvastatin 20mg  now. Will recheck fasting in 2 months.    E. Supple, PharmD, BCACP, CPP [facility] [address], [place], [place] [address] Phone: [phone]; Fax: [phone] 11/28/2018 8:31 AM

## 2018-11-30 ENCOUNTER — Other Ambulatory Visit: Payer: Self-pay | Admitting: Cardiology

## 2018-12-10 ENCOUNTER — Ambulatory Visit: Payer: BLUE CROSS/BLUE SHIELD | Admitting: Family Medicine

## 2018-12-10 NOTE — Progress Notes (Deleted)
[name] [address] [place], [place] [address] Phone: [phone] Subjective:    I'm seeing this patient by the request  of:    CC:   [id number]  Debra Carpenter is a 53 y.o. female coming in with complaint of ***  Onset-  Location Duration-  Character- Aggravating factors- Reliving factors-  Therapies tried-  Severity-     Past Medical History:  Diagnosis Date  . Anginal pain (HCC) 9/12   states negative cardiology workup exception of murmur  . Anxiety and depression   . Bony sclerosis 08/22/2011  . GERD (gastroesophageal reflux disease)   . Heart murmur   . Hypertension   . Lung nodule 08/24/2011   Past Surgical History:  Procedure Laterality Date  . ABDOMINAL HYSTERECTOMY  2007  . LAPAROSCOPIC GASTRIC BANDING  2011  . LAPAROSCOPIC GASTRIC SLEEVE RESECTION N/A 07/21/2013   Procedure: LAPAROSCOPIC GASTRIC SLEEVE RESECTION, LYSIS OF ADHESIONS, EGD;  Surgeon: Lodema Pilot, DO;  Location: [facility];  Service: General;  Laterality: N/A;  . LAPAROSCOPIC LYSIS OF ADHESIONS  07/21/2013   Procedure: LAPAROSCOPIC LYSIS OF ADHESIONS;  Surgeon: Lodema Pilot, DO;  Location: [facility];  Service: General;;  . LAPAROSCOPIC REPAIR AND REMOVAL OF GASTRIC BAND N/A 05/05/2013   Procedure: LAPAROSCOPIC REMOVAL OF ADJUSTABLE GASTRIC BAND AND PORT, ENDOSCOPY ;  Surgeon: Lodema Pilot, DO;  Location: [facility];  Service: General;  Laterality: N/A;  laparoscopic removal of adjustable gastric band AND PORT   . UPPER GI ENDOSCOPY  07/21/2013   Procedure: UPPER GI ENDOSCOPY;  Surgeon: Lodema Pilot, DO;  Location: [facility];  Service: General;;   Social History   Socioeconomic History  . Marital status: Married    Spouse name: Not on file  . Number of children: 3  . Years of education: Not on file  . Highest education level: Not on file  Occupational History  . 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 Financial resource strain: Not  on file  . Food insecurity:    Worry: Not on file    Inability: Not on file  . Transportation needs:    Medical: Not on file    Non-medical: Not on file  Tobacco Use  . Smoking status: Never Smoker  . Smokeless tobacco: Never Used  Substance and Sexual Activity  . Alcohol use: No  . Drug use: No  . Sexual activity: Not Currently    Birth control/protection: Surgical  Lifestyle  . Physical activity:    Days per week: Not on file    Minutes per session: Not on file  . Stress: Not on file  Relationships  . Social connections:    Talks on phone: Not on file    Gets together: Not on file    Attends religious service: Not on file    Active member of club or organization: Not on file    Attends meetings of clubs or organizations: Not on file    Relationship status: Not on file  Other Topics Concern  . 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     3 children- 76 daughter, 61 son, 41 daughter               Allergies  Allergen Reactions  . Ace Inhibitors Anaphylaxis    Angioedema   .  Adhesive [Tape] Hives  . Amlodipine     LEE   Family History  Problem Relation Age of Onset  . Hypertension Mother   . Hypertension Father   . Cancer Father        ? colon cancer  . Cancer Maternal Grandmother        ovarian cancer  . Hypertension Maternal Grandfather   . Hypertension Paternal Grandfather      Current Outpatient Medications (Cardiovascular):  .  rosuvastatin (CRESTOR) 20 MG tablet, Take 1 tablet (20 mg total) by mouth daily. Marland Kitchen.  spironolactone (ALDACTONE) 50 MG tablet, Take 1 tablet (50 mg total) by mouth daily.  Current Outpatient Medications (Respiratory):  .  cetirizine (ZYRTEC) 10 MG tablet, Take 10 mg by mouth daily as needed for allergies.  Current Outpatient Medications (Analgesics):  .  aspirin 81 MG EC tablet, TAKE 1 TABLET BY MOUTH EVERY  DAY      Past medical history, social, surgical and family history all reviewed in electronic medical record.  No pertanent information unless stated regarding to the chief complaint.   Review of Systems:  No headache, visual changes, nausea, vomiting, diarrhea, constipation, dizziness, abdominal pain, skin rash, fevers, chills, night sweats, weight loss, swollen lymph nodes, body aches, joint swelling, muscle aches, chest pain, shortness of breath, mood changes.   Objective  There were no vitals taken for this visit. Systems examined below as of    General: No apparent distress alert and oriented x3 mood and affect normal, dressed appropriately.  HEENT: Pupils equal, extraocular movements intact  Respiratory: Patient's speak in full sentences and does not appear short of breath  Cardiovascular: No lower extremity edema, non tender, no erythema  Skin: Warm dry intact with no signs of infection or rash on extremities or on axial skeleton.  Abdomen: Soft nontender  Neuro: Cranial nerves II through XII are intact, neurovascularly intact in all extremities with 2+ DTRs and 2+ pulses.  Lymph: No lymphadenopathy of posterior or anterior cervical chain or axillae bilaterally.  Gait normal with good balance and coordination.  MSK:  Non tender with full range of motion and good stability and symmetric strength and tone of shoulders, elbows, wrist, hip, knee and ankles bilaterally.     Impression and Recommendations:     This case required medical decision making of moderate complexity. The above documentation has been reviewed and is accurate and complete Judi SaaZachary M , DO       Note: This dictation was prepared with Dragon dictation along with smaller phrase technology. Any transcriptional errors that result from this process are unintentional.

## 2019-01-02 DIAGNOSIS — R05 Cough: Secondary | ICD-10-CM | POA: Diagnosis not present

## 2019-01-02 DIAGNOSIS — J029 Acute pharyngitis, unspecified: Secondary | ICD-10-CM | POA: Diagnosis not present

## 2019-01-02 DIAGNOSIS — J060 Acute laryngopharyngitis: Secondary | ICD-10-CM | POA: Diagnosis not present

## 2019-01-02 DIAGNOSIS — R52 Pain, unspecified: Secondary | ICD-10-CM | POA: Diagnosis not present

## 2019-01-26 NOTE — Progress Notes (Signed)
Patient ID: Debra Carpenter                 DOB: 08-02-1966                      MRN: [id number]     HPI: Debra Carpenter is a pleasant 53 y.o. female patient of Dr Clifton James referred by Debra Lis, PA to HTN clinic. PMH is significant for HTN, chest pain, GERD, TIA, and family history of CAD. She presented to [facility] on 09/24/18 with chest pain. Troponins were negative and EKG showed no abnormalities. Coronary CTA showed no significant stenosis, calcium score was 0, and LVEF was normal. She was hypertensive during admission and chest pain resolved with improvement of BP. Enalapril was discontinued due to angioedema (severe swelling of lips and face). Her spironolactone was increased to 50mg  daily with f/u BMET stable. Her atorvastatin was also changed to higher dose of rosuvastatin given history of TIA.  She was last seen in pharmacy clinic on 1/3. Her blood pressure was improved and at goal on spironolactone 50 mg daily. She reports doing well. She reports she took rosuvastatin for a few days and then developed some dull aching pains in her legs, she continued rosuvastatin for a total of 2 weeks and then stopped. Leg pain resolved upon discontinuing within a few days. She has started to try to stay more active and eat healthier, eating 2 shakes/day (Radio producer).    She reports doing well with her spironolactone 50 mg daily. She has been monitoring her blood pressure at home, SBP 130-140s. She reports some dizziness in the morning, with fast movements (not necessarily related to timing of medication). She denies headaches and blurred vision. She has gained a few pounds recently, concerned it may be from her spironolactone. Has mostly stopped cough/cold products. She did not take her spironolactone this morning (typically takes at 730 AM).  Current HTN meds: spironolactone 50mg  daily Previously tried: enalapril - angioedema, amlodipine - LEE BP goal: <130/79mmHg  Current lipid meds:  none Previously tried: atorvastatin 10mg  and 20mg  - myalgias; rosuvastatin 20 mg daily - myalgias (began a few hours after starting medication, resolved a few days after stopping) LDL goal: < 70 due to TIA and strong family history of CAD  Family History: Mother with 7 stents, PPM, CAD in her 83s, and afib. Father with CAD and MI in his 52s. Paternal grandfather died from MI in his 74s.  Social History: Denies tobacco, alcohol, and illicit drug use.  Diet: Decreasing bread and rice. Cheerios or yogurt for breakfast. Eating more chicken and fish. Drinks juice and water. Drinks 2 shakes/day Marzella Schlein, Psychologist, counselling)  Exercise: Likes walking, jogging, elliptical, and yoga classes. [facility] and has been [facility] more often now.  Home BP readings: Averaging systolic 130-140 mmHg  Labs: 01/27/19:  09/25/18: Total 189, LDL 120, TG 36, HDL 62 (off statin)  Wt Readings from Last 3 Encounters:  10/06/18 250 lb 1.9 oz (113.5 kg)  09/25/18 245 lb 1.6 oz (111.2 kg)  09/16/18 251 lb (113.9 kg)   BP Readings from Last 3 Encounters:  01/27/19 118/78  11/28/18 130/74  10/21/18 (!) 154/84   Pulse Readings from Last 3 Encounters:  01/27/19 69  11/28/18 70  10/21/18 (!) 52    Renal function: CrCl cannot be calculated (Patient's most recent lab result is older than the maximum 21 days allowed.).  Past Medical History:  Diagnosis Date  .  Anginal pain (HCC) 9/12   states negative cardiology workup exception of murmur  . Anxiety and depression   . Bony sclerosis 08/22/2011  . GERD (gastroesophageal reflux disease)   . Heart murmur   . Hypertension   . Lung nodule 08/24/2011    Current Outpatient Medications on File Prior to Visit  Medication Sig Dispense Refill  . aspirin 81 MG EC tablet TAKE 1 TABLET BY MOUTH EVERY DAY 30 tablet 11  . cetirizine (ZYRTEC) 10 MG tablet Take 10 mg by mouth daily as needed for allergies.     No current facility-administered medications  on file prior to visit.     Allergies  Allergen Reactions  . Ace Inhibitors Anaphylaxis    Angioedema   . Adhesive [Tape] Hives  . Amlodipine     LEE     Assessment/Plan:  1. Hypertension - BP much improved and now at goal <130/51mmHg in clinic. Will continue spironolactone 50mg  daily. Advised pt to continue to monitor BP at home and monitor dizziness. Not likely her spironolactone is contributing to her weight gain, appears weight to be more controlled with lifestyle modifications. Encouraged patient to continue exercising more frequently and her healthy diet.  2. Hyperlipidemia - LDL 120 (08/2018) above goal < 70 due to hx of TIA and strong family history of premature CAD. Patient was started on rosuvastatin 20 mg daily, but was only able to tolerate for two weeks and stopped taking due to myalgias. Repeat lipids today, direct LDL added as patient is not fasting. Will discuss rechallenging with low dose statin once labs result.   Marcelino Freestone, PharmD PGY2 Cardiology Pharmacy Resident 01/27/2019 10:11 AM     Addendum: Labs resulted.  Lipid panel (3/3): Total 183, HDL 60, TG 56, Direct LDL 053.  CMET: renal function and potassium stable on spironolactone. LFTs WNL.  Will start rosuvastatin 5 mg daily to target LDL goal < 70. Will follow-up in 3 months with repeat lipid panel. Patient verbalized understanding  Marcelino Freestone, PharmD PGY2 Cardiology Pharmacy Resident 01/28/2019 10:09 AM

## 2019-01-27 ENCOUNTER — Ambulatory Visit (INDEPENDENT_AMBULATORY_CARE_PROVIDER_SITE_OTHER): Payer: BLUE CROSS/BLUE SHIELD | Admitting: Pharmacist

## 2019-01-27 ENCOUNTER — Other Ambulatory Visit: Payer: BLUE CROSS/BLUE SHIELD

## 2019-01-27 VITALS — BP 118/78 | HR 69

## 2019-01-27 DIAGNOSIS — I10 Essential (primary) hypertension: Secondary | ICD-10-CM

## 2019-01-27 DIAGNOSIS — E782 Mixed hyperlipidemia: Secondary | ICD-10-CM | POA: Diagnosis not present

## 2019-01-27 LAB — LIPID PANEL
Chol/HDL Ratio: 3.1 ratio (ref 0.0–4.4)
Cholesterol, Total: 183 mg/dL (ref 100–199)
HDL: 60 mg/dL (ref >39)
LDL Calculated: 112 mg/dL — ABNORMAL HIGH (ref 0–99)
Triglycerides: 56 mg/dL (ref 0–149)
VLDL Cholesterol Cal: 11 mg/dL (ref 5–40)

## 2019-01-27 LAB — COMPREHENSIVE METABOLIC PANEL
A/G RATIO: 1.3 (ref 1.2–2.2)
ALBUMIN: 4.2 g/dL (ref 3.8–4.9)
ALT: 13 IU/L (ref 0–32)
AST: 20 IU/L (ref 0–40)
Alkaline Phosphatase: 98 IU/L (ref 39–117)
BILIRUBIN TOTAL: 0.4 mg/dL (ref 0.0–1.2)
BUN / CREAT RATIO: 27 — AB (ref 9–23)
BUN: 18 mg/dL (ref 6–24)
CALCIUM: 9.6 mg/dL (ref 8.7–10.2)
CHLORIDE: 100 mmol/L (ref 96–106)
CO2: 22 mmol/L (ref 20–29)
Creatinine, Ser: 0.66 mg/dL (ref 0.57–1.00)
GFR calc non Af Amer: 101 mL/min/{1.73_m2} (ref >59)
GFR, EST AFRICAN AMERICAN: 117 mL/min/{1.73_m2} (ref >59)
Globulin, Total: 3.2 g/dL (ref 1.5–4.5)
Glucose: 78 mg/dL (ref 65–99)
POTASSIUM: 4.2 mmol/L (ref 3.5–5.2)
Sodium: 137 mmol/L (ref 134–144)
TOTAL PROTEIN: 7.4 g/dL (ref 6.0–8.5)

## 2019-01-27 LAB — LDL CHOLESTEROL, DIRECT: LDL Direct: 111 mg/dL — ABNORMAL HIGH (ref 0–99)

## 2019-01-27 MED ORDER — SPIRONOLACTONE 50 MG PO TABS: 50.0000 mg | ORAL_TABLET | Freq: Every day | ORAL | 11 refills | Status: DC

## 2019-01-27 NOTE — Patient Instructions (Signed)
It was nice to see you today.  Continue to take your spironolactone every day. Keep monitoring your blood pressure, goal is less than 130/80.  I will call you with the results of your cholesterol panel, and discuss options once they result.

## 2019-01-28 MED ORDER — ROSUVASTATIN CALCIUM 5 MG PO TABS: 5.0000 mg | ORAL_TABLET | Freq: Every day | ORAL | 11 refills | Status: DC

## 2019-01-29 ENCOUNTER — Other Ambulatory Visit: Payer: BLUE CROSS/BLUE SHIELD

## 2019-01-30 ENCOUNTER — Other Ambulatory Visit: Payer: BLUE CROSS/BLUE SHIELD

## 2019-02-05 DIAGNOSIS — E669 Obesity, unspecified: Secondary | ICD-10-CM | POA: Diagnosis not present

## 2019-02-05 DIAGNOSIS — F419 Anxiety disorder, unspecified: Secondary | ICD-10-CM | POA: Diagnosis not present

## 2019-02-05 DIAGNOSIS — I10 Essential (primary) hypertension: Secondary | ICD-10-CM | POA: Diagnosis not present

## 2019-02-05 DIAGNOSIS — Z6839 Body mass index (BMI) 39.0-39.9, adult: Secondary | ICD-10-CM | POA: Diagnosis not present

## 2019-02-20 DIAGNOSIS — E669 Obesity, unspecified: Secondary | ICD-10-CM | POA: Diagnosis not present

## 2019-02-20 DIAGNOSIS — Z713 Dietary counseling and surveillance: Secondary | ICD-10-CM | POA: Diagnosis not present

## 2019-03-10 DIAGNOSIS — R7303 Prediabetes: Secondary | ICD-10-CM | POA: Diagnosis not present

## 2019-03-10 DIAGNOSIS — Z6841 Body Mass Index (BMI) 40.0 and over, adult: Secondary | ICD-10-CM | POA: Diagnosis not present

## 2019-03-10 DIAGNOSIS — R635 Abnormal weight gain: Secondary | ICD-10-CM | POA: Diagnosis not present

## 2019-03-10 DIAGNOSIS — I10 Essential (primary) hypertension: Secondary | ICD-10-CM | POA: Diagnosis not present

## 2019-03-23 DIAGNOSIS — Z713 Dietary counseling and surveillance: Secondary | ICD-10-CM | POA: Diagnosis not present

## 2019-03-23 DIAGNOSIS — Z6841 Body Mass Index (BMI) 40.0 and over, adult: Secondary | ICD-10-CM | POA: Diagnosis not present

## 2019-04-29 ENCOUNTER — Telehealth: Payer: Self-pay

## 2019-04-29 NOTE — Telephone Encounter (Signed)
    COVID-19 Pre-Screening Questions:  . In the past 7 to 10 days have you had a cough,  shortness of breath, headache, congestion, fever (100 or greater) body aches, chills, sore throat, or sudden loss of taste or sense of smell? . Have you been around anyone with known Covid 19. . Have you been around anyone who is awaiting Covid 19 test results in the past 7 to 10 days? . Have you been around anyone who has been exposed to Covid 19, or has mentioned symptoms of Covid 19 within the past 7 to 10 days?  If you have any concerns/questions about symptoms patients report during screening (either on the phone or at threshold). Contact the provider seeing the patient or DOD for further guidance.  If neither are available contact a member of the leadership team.          Pt answer NO to all pre-screening questions. klb 04/29/2019 1026

## 2019-04-30 ENCOUNTER — Other Ambulatory Visit: Payer: Self-pay

## 2019-04-30 ENCOUNTER — Other Ambulatory Visit: Payer: BC Managed Care – PPO | Admitting: *Deleted

## 2019-04-30 DIAGNOSIS — E782 Mixed hyperlipidemia: Secondary | ICD-10-CM | POA: Diagnosis not present

## 2019-04-30 LAB — LIPID PANEL
Chol/HDL Ratio: 3.2 ratio (ref 0.0–4.4)
Cholesterol, Total: 186 mg/dL (ref 100–199)
HDL: 58 mg/dL (ref >39)
LDL Calculated: 111 mg/dL — ABNORMAL HIGH (ref 0–99)
Triglycerides: 86 mg/dL (ref 0–149)
VLDL Cholesterol Cal: 17 mg/dL (ref 5–40)

## 2019-05-01 ENCOUNTER — Telehealth: Payer: Self-pay | Admitting: Pharmacist

## 2019-05-01 DIAGNOSIS — I10 Essential (primary) hypertension: Secondary | ICD-10-CM

## 2019-05-01 DIAGNOSIS — E782 Mixed hyperlipidemia: Secondary | ICD-10-CM

## 2019-05-01 MED ORDER — CHLORTHALIDONE 25 MG PO TABS: 25.0000 mg | ORAL_TABLET | Freq: Every day | ORAL | 11 refills | Status: DC

## 2019-05-01 MED ORDER — PRAVASTATIN SODIUM 20 MG PO TABS: 10.0000 mg | ORAL_TABLET | Freq: Every evening | ORAL | 11 refills | Status: DC

## 2019-05-01 NOTE — Telephone Encounter (Signed)
Called pt regarding labs as she was supposed to start rosuvastatin 5mg  daily after last lipid visit but labs have not changed. She states she stopped taking rosuvastatin a while ago due to a burning sensation in her legs. She is also intolerant to atorvastatin. She is willing to try pravastatin 10mg  daily. I'll call pt to follow up with tolerability in 1 month, we can try Zetia if she develops myalgias on pravastatin as well.  Pt also inquired about her BP medication - she reports 15-20 lb weight gain that she attributes to her spironolactone. States she has been eating healthy and is in a weight loss group. Advised pt that weight gain is not typically associated with spironolactone, however she prefers to try something else. Home BP 119/89 recently. Previously experienced angioedema on ACEi and LEE on amlodipine. Will stop spironolactone 50mg  and start chlorthalidone 25mg  daily. Will recheck BMET in 3 weeks.

## 2019-05-20 ENCOUNTER — Telehealth: Payer: Self-pay | Admitting: Pharmacist

## 2019-05-20 DIAGNOSIS — E782 Mixed hyperlipidemia: Secondary | ICD-10-CM

## 2019-05-20 NOTE — Telephone Encounter (Signed)
Patient called clinic and reports muscle pain on pravastatin for the past few weeks. She is also intolerant to atorvastatin and rosuvastatin 5mg .   Discussed trying Zetia or Repatha - pt is hesitant to start anything new since she has had trouble tolerating the 3 medications she has previously tried. She would like to recheck her cholesterol in 1 month to see how her numbers look. Advised pt that we would like to target an LDL < 70 due to her TIA. Will discuss Zetia or Repatha again with pt once follow up labs result - she seems a bit hesitant about Zetia due to potential for GI discomfort.  She does report tolerating her chlorthalidone well. Reports BP readings have been normal and she has lost 2-3 lbs and does not feel as bloated. She is aware to come to clinic on Monday for follow up BMET.

## 2019-05-25 ENCOUNTER — Other Ambulatory Visit: Payer: BC Managed Care – PPO

## 2019-05-26 ENCOUNTER — Telehealth: Payer: Self-pay | Admitting: Pharmacist

## 2019-05-26 NOTE — Telephone Encounter (Signed)
Left message for pt - she missed BMET lab draw yesterday which needs to be drawn this week due to recent chlorthalidone start.

## 2019-06-01 NOTE — Telephone Encounter (Signed)
Patient returned call left VM on our machine. Called patient back-left VM to set up another day for lab work

## 2019-06-02 ENCOUNTER — Other Ambulatory Visit: Payer: Self-pay

## 2019-06-02 ENCOUNTER — Other Ambulatory Visit: Payer: BC Managed Care – PPO

## 2019-06-02 DIAGNOSIS — I10 Essential (primary) hypertension: Secondary | ICD-10-CM

## 2019-06-02 NOTE — Telephone Encounter (Signed)
Called pt and scheduled BMET for this afternoon. Pt advised to wear a mask and denies s/sx of COVID.

## 2019-06-03 LAB — BASIC METABOLIC PANEL
BUN/Creatinine Ratio: 28 — ABNORMAL HIGH (ref 9–23)
BUN: 15 mg/dL (ref 6–24)
CO2: 26 mmol/L (ref 20–29)
Calcium: 9.6 mg/dL (ref 8.7–10.2)
Chloride: 98 mmol/L (ref 96–106)
Creatinine, Ser: 0.54 mg/dL — ABNORMAL LOW (ref 0.57–1.00)
GFR calc Af Amer: 125 mL/min/{1.73_m2} (ref >59)
GFR calc non Af Amer: 108 mL/min/{1.73_m2} (ref >59)
Glucose: 90 mg/dL (ref 65–99)
Potassium: 3.6 mmol/L (ref 3.5–5.2)
Sodium: 141 mmol/L (ref 134–144)

## 2019-06-05 ENCOUNTER — Telehealth: Payer: Self-pay | Admitting: *Deleted

## 2019-06-05 MED ORDER — POTASSIUM CHLORIDE CRYS ER 20 MEQ PO TBCR: 20.0000 meq | EXTENDED_RELEASE_TABLET | Freq: Every day | ORAL | 6 refills | Status: DC

## 2019-06-05 NOTE — Telephone Encounter (Signed)
-----   Message from Burnell Blanks, MD sent at 06/04/2019  8:28 AM EDT ----- BMET stable after starting chlorthalidone. I would recommend that she continue this and also start Kdur 20 meq per day. Thanks, chris

## 2019-06-05 NOTE — Telephone Encounter (Signed)
Pt notified. Will send prescription to [facility] on [facility].  She is asking about possible side effects and according to micromedex nausea, vomiting, diarrhea and flatulence are side effects.  Pt will let us know if she has problems tolerating. I offered to set up follow up appointment in our office but pt plans to follow up in primary care and does not want to schedule appointment in our office at this time.

## 2019-06-15 ENCOUNTER — Other Ambulatory Visit: Payer: Self-pay | Admitting: Cardiovascular Disease

## 2019-06-15 MED ORDER — CHLORTHALIDONE 25 MG PO TABS: 25.0000 mg | ORAL_TABLET | Freq: Every day | ORAL | 0 refills | Status: DC

## 2019-06-15 MED ORDER — POTASSIUM CHLORIDE CRYS ER 20 MEQ PO TBCR: 20.0000 meq | EXTENDED_RELEASE_TABLET | Freq: Every day | ORAL | 0 refills | Status: AC

## 2019-06-22 ENCOUNTER — Other Ambulatory Visit: Payer: BC Managed Care – PPO

## 2019-07-10 DIAGNOSIS — M5136 Other intervertebral disc degeneration, lumbar region: Secondary | ICD-10-CM | POA: Diagnosis not present

## 2019-07-10 DIAGNOSIS — M5416 Radiculopathy, lumbar region: Secondary | ICD-10-CM | POA: Diagnosis not present

## 2019-07-10 DIAGNOSIS — K219 Gastro-esophageal reflux disease without esophagitis: Secondary | ICD-10-CM | POA: Diagnosis not present

## 2019-07-10 DIAGNOSIS — I10 Essential (primary) hypertension: Secondary | ICD-10-CM | POA: Diagnosis not present

## 2019-07-15 ENCOUNTER — Ambulatory Visit: Payer: BC Managed Care – PPO | Admitting: Family Medicine

## 2019-07-15 ENCOUNTER — Encounter: Payer: Self-pay | Admitting: Family Medicine

## 2019-07-15 ENCOUNTER — Ambulatory Visit (INDEPENDENT_AMBULATORY_CARE_PROVIDER_SITE_OTHER)
Admission: RE | Admit: 2019-07-15 | Discharge: 2019-07-15 | Disposition: A | Discharge status: Home / Self Care | Payer: BC Managed Care – PPO | Source: Ambulatory Visit | Attending: Family Medicine | Admitting: Family Medicine

## 2019-07-15 ENCOUNTER — Other Ambulatory Visit: Payer: Self-pay

## 2019-07-15 VITALS — BP 130/80 | HR 61 | Ht 70.0 in | Wt 261.0 lb

## 2019-07-15 DIAGNOSIS — M545 Low back pain: Secondary | ICD-10-CM

## 2019-07-15 DIAGNOSIS — M25569 Pain in unspecified knee: Secondary | ICD-10-CM | POA: Diagnosis not present

## 2019-07-15 DIAGNOSIS — M1711 Unilateral primary osteoarthritis, right knee: Secondary | ICD-10-CM | POA: Diagnosis not present

## 2019-07-15 DIAGNOSIS — M5416 Radiculopathy, lumbar region: Secondary | ICD-10-CM | POA: Diagnosis not present

## 2019-07-15 DIAGNOSIS — G8929 Other chronic pain: Secondary | ICD-10-CM

## 2019-07-15 MED ORDER — GABAPENTIN 100 MG PO CAPS: 200.0000 mg | ORAL_CAPSULE | Freq: Every day | ORAL | 3 refills | Status: DC

## 2019-07-15 NOTE — Patient Instructions (Addendum)
Good to see you PT will call you Exercise 3 times a week Xray downstairs  Gabapentin 200 mg at night See me again in 4 weeks

## 2019-07-15 NOTE — Progress Notes (Signed)
[name] [place], [place] [address] Phone: [phone] Subjective:   I Debra Carpenter am serving as a Education administrator for Dr. Hulan Saas.   CC: Knee pain, back pain  CWC:[id number]   09/16/2018 Patient was given injection.  Discussed icing regimen and home exercise.  Which activities to do which wants to avoid.  Follow-up in 4 to 8 weeks.  Can repeat every 3 months.  Sent to formal physical therapy.   07/15/2019 Debra Carpenter is a 53 y.o. female coming in with complaint of knee pain. Burning sensation in the back of her right thigh. Was told by PCP that it has to do with her lower back.  Patient has had right knee pain and has noticed more swelling.  Known arthritic changes.  Last injection was in October.  Patient states some increasing swelling and instability noted.       Past Medical History:  Diagnosis Date  . Anginal pain ([place]) 9/12   states negative cardiology workup exception of murmur  . Anxiety and depression   . Bony sclerosis 08/22/2011  . GERD (gastroesophageal reflux disease)   . Heart murmur   . Hypertension   . Lung nodule 08/24/2011   Past Surgical History:  Procedure Laterality Date  . ABDOMINAL HYSTERECTOMY  2007  . LAPAROSCOPIC GASTRIC BANDING  2011  . LAPAROSCOPIC GASTRIC SLEEVE RESECTION N/A 07/21/2013   Procedure: LAPAROSCOPIC GASTRIC SLEEVE RESECTION, LYSIS OF ADHESIONS, EGD;  Surgeon: Madilyn Hook, DO;  [facility];  Service: General;  Laterality: N/A;  . LAPAROSCOPIC LYSIS OF ADHESIONS  07/21/2013   Procedure: LAPAROSCOPIC LYSIS OF ADHESIONS;  Surgeon: Madilyn Hook, DO;  [facility];  Service: General;;  . LAPAROSCOPIC REPAIR AND REMOVAL OF GASTRIC BAND N/A 05/05/2013   Procedure: LAPAROSCOPIC REMOVAL OF ADJUSTABLE GASTRIC BAND AND PORT, ENDOSCOPY ;  Surgeon: Madilyn Hook, DO;  [facility];  Service: General;  Laterality: N/A;  laparoscopic removal of adjustable gastric band AND PORT   . UPPER GI  ENDOSCOPY  07/21/2013   Procedure: UPPER GI ENDOSCOPY;  Surgeon: Madilyn Hook, DO;  [facility];  Service: General;;   Social History   Socioeconomic History  . Marital status: Married    Spouse name: Not on file  . Number of children: 3  . Years of education: Not on file  . Highest education level: Not on file  Occupational History  . 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 Financial resource strain: Not on file  . Food insecurity    Worry: Not on file    Inability: Not on file  . Transportation needs    Medical: Not on file    Non-medical: Not on file  Tobacco Use  . Smoking status: Never Smoker  . Smokeless tobacco: Never Used  Substance and Sexual Activity  . Alcohol use: No  . Drug use: No  . Sexual activity: Not Currently    Birth control/protection: Surgical  Lifestyle  . Physical activity    Days per week: Not on file    Minutes per session: Not on file  . Stress: Not on file  Relationships  . Social Herbalist on phone: Not on file    Gets together: Not on file    Attends religious service: Not on file    Active member of club or organization: Not on file    Attends meetings of clubs or  organizations: Not on file    Relationship status: Not on file  Other Topics Concern  . 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     3 children- 2724 daughter, 5022 son, 2918 daughter               Allergies  Allergen Reactions  . Ace Inhibitors Anaphylaxis    Angioedema   . Adhesive [Tape] Hives  . Amlodipine     LEE  . Spironolactone     Weight gain  . Statins     Myalgias with rosuvastatin 5mg  daily, pravastatin 10mg , and atorvastatin   Family History  Problem Relation Age of Onset  . Hypertension Mother   . Hypertension Father   . Cancer Father        ? colon cancer  . Cancer Maternal  Grandmother        ovarian cancer  . Hypertension Maternal Grandfather   . Hypertension Paternal Grandfather      Current Outpatient Medications (Cardiovascular):  .  chlorthalidone (HYGROTON) 25 MG tablet, Take 1 tablet (25 mg total) by mouth daily. Please make yearly appt with Dr. Clifton JamesMcalhany for November for future refills. 1st attempt   Current Outpatient Medications (Analgesics):  .  aspirin 81 MG EC tablet, TAKE 1 TABLET BY MOUTH EVERY DAY   Current Outpatient Medications (Other):  .  potassium chloride SA (K-DUR) 20 MEQ tablet, Take 1 tablet (20 mEq total) by mouth daily. Please make yearly appt with Dr. Clifton JamesMcalhany for November for future refills. 1st attempt .  gabapentin (NEURONTIN) 100 MG capsule, Take 2 capsules (200 mg total) by mouth at bedtime.    Past medical history, social, surgical and family history all reviewed in electronic medical record.  No pertanent information unless stated regarding to the chief complaint.   Review of Systems:  No headache, visual changes, nausea, vomiting, diarrhea, constipation, dizziness, abdominal pain, skin rash, fevers, chills, night sweats, weight loss, swollen lymph nodes, body aches, joint swelling,  chest pain, shortness of breath, mood changes.  Positive muscle aches  Objective  Blood pressure 130/80, pulse 61, height 5\' 10"  (1.778 Carpenter), weight 261 lb (118.4 kg), SpO2 98 %.    General: No apparent distress alert and oriented x3 mood and affect normal, dressed appropriately.  HEENT: Pupils equal, extraocular movements intact  Respiratory: Patient's speak in full sentences and does not appear short of breath  Cardiovascular: No lower extremity edema, non tender, no erythema  Skin: Warm dry intact with no signs of infection or rash on extremities or on axial skeleton.  Abdomen: Soft nontender  Neuro: Cranial nerves II through XII are intact, neurovascularly intact in all extremities with 2+ DTRs and 2+ pulses.  Lymph: No  lymphadenopathy of posterior or anterior cervical chain or axillae bilaterally.  Gait normal with good balance and coordination.  MSK:  Non tender with full range of motion and good stability and symmetric strength and tone of shoulders, elbows, wrist, hip, and ankles bilaterally.  Knee: Right valgus deformity noted. Large thigh to calf ratio.  Tender to palpation over medial and PF joint line.  ROM full in flexion and extension and lower leg rotation. instability with valgus force.  painful patellar compression. Patellar glide with moderate crepitus. Patellar and quadriceps tendons unremarkable. Hamstring and quadriceps strength is normal. Contralateral knee shows mild arthritic changes with minimal discomfort  to palpation  Back exam shows some mild loss of lordosis with severe tenderness to palpation in the L3-L5 paraspinal musculature on the right side.  Mild positive straight leg test at 35 degrees of forward flexion.  5 out of 5 strength of the lower extremity with deep tendon reflexes intact.  Tightness with Pearlean BrownieFaber test noted on the right as well.  After informed written and verbal consent, patient was seated on exam table. Right knee was prepped with alcohol swab and utilizing anterolateral approach, patient's right knee space was injected with 4:1  marcaine 0.5%: Kenalog 40mg /dL. Patient tolerated the procedure well without immediate complications.    Impression and Recommendations:     This case required medical decision making of moderate complexity. The above documentation has been reviewed and is accurate and complete Debra Carpenter , DO       Note: This dictation was prepared with Dragon dictation along with smaller phrase technology. Any transcriptional errors that result from this process are unintentional.

## 2019-07-15 NOTE — Assessment & Plan Note (Signed)
Right-sided lumbar radiculopathy with a positive straight leg test.  Started on gabapentin home exercises and formal physical therapy.  Discussed weight loss.  Follow-up again in 4 to 6 weeks.  X-rays pending.  May need advanced imaging.

## 2019-07-15 NOTE — Assessment & Plan Note (Signed)
Patient having another injection today.  Known arthritic changes.  Could be a candidate for Visco supplementation if needed.  Patient now has responded very well previously to the conservative therapy and will do formal physical therapy.  Follow-up again in 4 to 8 weeks

## 2019-07-20 ENCOUNTER — Ambulatory Visit: Payer: BC Managed Care – PPO | Attending: Family Medicine | Admitting: Physical Therapy

## 2019-07-20 ENCOUNTER — Encounter: Payer: Self-pay | Admitting: Physical Therapy

## 2019-07-20 ENCOUNTER — Other Ambulatory Visit: Payer: Self-pay

## 2019-07-20 DIAGNOSIS — M62830 Muscle spasm of back: Secondary | ICD-10-CM | POA: Insufficient documentation

## 2019-07-20 DIAGNOSIS — M5441 Lumbago with sciatica, right side: Secondary | ICD-10-CM | POA: Diagnosis not present

## 2019-07-20 NOTE — Therapy (Signed)
[place], [place], [address] Phone: [phone]   Fax:  [phone]  Physical Therapy Evaluation  Patient Details  Name: Valisa Karpel MRN: [id number] Date of Birth: [birth year] Referring Provider (PT): Creig Hines   Encounter Date: 07/20/2019  PT End of Session - 07/20/19 1631    Visit Number  1    Date for PT Re-Evaluation  09/19/19    PT Start Time  1500    PT Stop Time  1545    PT Time Calculation (min)  45 min    Activity Tolerance  Patient tolerated treatment well    Behavior During Therapy  [facility] for tasks assessed/performed       Past Medical History:  Diagnosis Date  . Anginal pain ([place]) 9/12   states negative cardiology workup exception of murmur  . Anxiety and depression   . Bony sclerosis 08/22/2011  . GERD (gastroesophageal reflux disease)   . Heart murmur   . Hypertension   . Lung nodule 08/24/2011    Past Surgical History:  Procedure Laterality Date  . ABDOMINAL HYSTERECTOMY  2007  . LAPAROSCOPIC GASTRIC BANDING  2011  . LAPAROSCOPIC GASTRIC SLEEVE RESECTION N/A 07/21/2013   Procedure: LAPAROSCOPIC GASTRIC SLEEVE RESECTION, LYSIS OF ADHESIONS, EGD;  Surgeon: Madilyn Hook, DO;  [facility];  Service: General;  Laterality: N/A;  . LAPAROSCOPIC LYSIS OF ADHESIONS  07/21/2013   Procedure: LAPAROSCOPIC LYSIS OF ADHESIONS;  Surgeon: Madilyn Hook, DO;  [facility];  Service: General;;  . LAPAROSCOPIC REPAIR AND REMOVAL OF GASTRIC BAND N/A 05/05/2013   Procedure: LAPAROSCOPIC REMOVAL OF ADJUSTABLE GASTRIC BAND AND PORT, ENDOSCOPY ;  Surgeon: Madilyn Hook, DO;  [facility];  Service: General;  Laterality: N/A;  laparoscopic removal of adjustable gastric band AND PORT   . UPPER GI ENDOSCOPY  07/21/2013   Procedure: UPPER GI ENDOSCOPY;  Surgeon: Madilyn Hook, DO;  [facility];  Service: General;;    There were no vitals filed for this visit.   Subjective Assessment -  07/20/19 1504    Subjective  Patient reports that she has right knee pain, reports DDD of the low back and the MD thought that this may be the cause of some pain with sciatica.  She does report that she started some running recently and this may have caused increased pain.    Limitations  Lifting;Standing;Walking    Patient Stated Goals  be stronger, have less pain    Currently in Pain?  Yes    Pain Score  1     Pain Location  Back   right knee   Pain Orientation  Lower    Pain Descriptors / Indicators  Aching    Pain Type  Acute pain    Pain Radiating Towards  was having pain down the right posterior leg to the knee    Pain Onset  1 to 4 weeks ago    Aggravating Factors   pain can be up to 9/10, running, sleeping, throbbing, worse with sitting    Pain Relieving Factors  when it hurts not much helps, a deep throb, takes Tylenol, had an injection last week, at times can have no pain    Effect of Pain on Daily Activities  limits sitting, running and sleeping         [facility] PT Assessment - 07/20/19 0001      Assessment   Medical Diagnosis  LBP, right knee pain  Referring Provider (PT)  Z. Smith    Onset Date/Surgical Date  07/13/19    Prior Therapy  for the back a year ago      Precautions   Precautions  None      Balance Screen   Has the patient fallen in the past 6 months  No    Has the patient had a decrease in activity level because of a fear of falling?   No    Is the patient reluctant to leave their home because of a fear of falling?   No      Home Environment   Additional Comments  does housework      Prior Function   Level of Independence  Independent    Vocation  Full time employment    Vocation Requirements  mostly sitting    Leisure  started jogging, 2 months ago      ROM / Strength   AROM / PROM / Strength  AROM;Strength      AROM   Overall AROM Comments  lumbar ROM decreased 25% for flexion, decreased 50% for other motions, right knee AROM WNL's with some  crepitus      Strength   Overall Strength Comments  4-/5 for the LE's, she cannot squat due to pain and weakness in the knees      Flexibility   Soft Tissue Assessment /Muscle Length  yes    Hamstrings  very tight and painful, right SLR 40 degrees, left SLR 60 degrees    Quadriceps  very tight    ITB  very tight    Piriformis  very tight with pain on the right'      Palpation   Palpation comment  she is very tender in the right SI and buttock area                Objective measurements completed on examination: See above findings.      [facility] Adult PT Treatment/Exercise - 07/20/19 0001      Modalities   Modalities  Electrical Stimulation;Moist Heat      Moist Heat Therapy   Number Minutes Moist Heat  15 Minutes    Moist Heat Location  Lumbar Spine      Electrical Stimulation   Electrical Stimulation Location  right low back into the buttock    Electrical Stimulation Action  IFC    Electrical Stimulation Parameters  supine    Electrical Stimulation Goals  Pain             PT Education - 07/20/19 1630    Education Details  Wms flexion, HS and piriformis stretch    Person(s) Educated  Patient    Methods  Explanation;Demonstration;Handout    Comprehension  Verbalized understanding       PT Short Term Goals - 07/20/19 1636      PT SHORT TERM GOAL #1   Title  independent with initial HEP    Time  2    Period  Weeks    Status  New        PT Long Term Goals - 07/20/19 1636      PT LONG TERM GOAL #1   Title  understand posture and body mechanics    Time  8    Period  Weeks    Status  New      PT LONG TERM GOAL #2   Title  decrease pain50%    Time  8    Period  Weeks    Status  New      PT LONG TERM GOAL #3   Title  increase ROM 25%    Time  8    Period  Weeks    Status  New      PT LONG TERM GOAL #4   Title  increase right SLR to 60 degrees    Time  8    Period  Weeks    Status  New             Plan - 07/20/19 1632     Clinical Impression Statement  Patient with LBP and right leg pain, she reports started over a month ago after starting to jog.  She reports that x-rays show DDD of the spine, she does report an injection in the knee that helped the pain.  She has crepitus in the knee, she is very tender in the right low back and the buttock, she is very tight in the HS with + SLR on the right, tight and painful piriformis.  She would like to get back to exercise    Stability/Clinical Decision Making  Stable/Uncomplicated    Clinical Decision Making  Low    Rehab Potential  Good    PT Frequency  2x / week    PT Duration  8 weeks    PT Treatment/Interventions  ADLs/Self Care Home Management;Cryotherapy;Electrical Stimulation;Iontophoresis 4mg /ml Dexamethasone;Moist Heat;Traction;Ultrasound;Therapeutic activities;Gait training;Therapeutic exercise;Functional mobility training;Balance training;Neuromuscular re-education;Patient/family education;Manual techniques;Dry needling    PT Next Visit Plan  work on core stability and flexibility    Consulted and Agree with Plan of Care  Patient       Patient will benefit from skilled therapeutic intervention in order to improve the following deficits and impairments:  Improper body mechanics, Pain, Increased muscle spasms, Decreased range of motion, Decreased strength, Impaired flexibility, Difficulty walking  Visit Diagnosis: Acute right-sided low back pain with right-sided sciatica - Plan: PT plan of care cert/re-cert  Muscle spasm of back - Plan: PT plan of care cert/re-cert     Problem List Patient Active Problem List   Diagnosis Date Noted  . Lumbar radiculopathy, right 07/15/2019  . Hyperlipidemia 11/28/2018  . TIA (transient ischemic attack) 10/21/2018  . Essential hypertension 09/24/2018  . Chest pain at rest   . Degenerative joint disease of knee, right 05/08/2018  . Pain of left heel 02/23/2013  . Bacterial vaginosis 12/25/2011  . Allergic rhinitis  12/25/2011  . Lung nodule 08/24/2011  . Atypical chest pain 08/22/2011  . Depression 08/22/2011  . Preventative health care 08/22/2011    Jearld LeschALBRIGHT,Feliza Diven W., PT 07/20/2019, 4:42 PM  [facility] [address] [place], [place], [address] Phone: [phone]   Fax:  [phone]  Name: Cresenciano GenreLisa Gunnoe MRN: [id number] Date of Birth: [birth year]

## 2019-07-23 ENCOUNTER — Ambulatory Visit: Payer: BC Managed Care – PPO | Admitting: Family Medicine

## 2019-07-28 ENCOUNTER — Ambulatory Visit: Payer: BC Managed Care – PPO | Attending: Family Medicine | Admitting: Physical Therapy

## 2019-07-28 ENCOUNTER — Other Ambulatory Visit: Payer: Self-pay

## 2019-07-28 ENCOUNTER — Encounter: Payer: Self-pay | Admitting: Physical Therapy

## 2019-07-28 DIAGNOSIS — M5441 Lumbago with sciatica, right side: Secondary | ICD-10-CM | POA: Diagnosis not present

## 2019-07-28 DIAGNOSIS — M62830 Muscle spasm of back: Secondary | ICD-10-CM | POA: Diagnosis not present

## 2019-07-28 NOTE — Therapy (Signed)
[place], [place], [address] Phone: [phone]   Fax:  [phone]  Physical Therapy Treatment  Patient Details  Name: Debra Carpenter MRN: [id number] Date of Birth: [birth year] Referring Provider (PT): Creig Hines   Encounter Date: 07/28/2019  PT End of Session - 07/28/19 4097    Visit Number  2    Date for PT Re-Evaluation  09/19/19    PT Start Time  1600    PT Stop Time  1655    PT Time Calculation (min)  55 min    Activity Tolerance  Patient tolerated treatment well    Behavior During Therapy  [facility] for tasks assessed/performed       Past Medical History:  Diagnosis Date  . Anginal pain ([place]) 9/12   states negative cardiology workup exception of murmur  . Anxiety and depression   . Bony sclerosis 08/22/2011  . GERD (gastroesophageal reflux disease)   . Heart murmur   . Hypertension   . Lung nodule 08/24/2011    Past Surgical History:  Procedure Laterality Date  . ABDOMINAL HYSTERECTOMY  2007  . LAPAROSCOPIC GASTRIC BANDING  2011  . LAPAROSCOPIC GASTRIC SLEEVE RESECTION N/A 07/21/2013   Procedure: LAPAROSCOPIC GASTRIC SLEEVE RESECTION, LYSIS OF ADHESIONS, EGD;  Surgeon: Madilyn Hook, DO;  [facility];  Service: General;  Laterality: N/A;  . LAPAROSCOPIC LYSIS OF ADHESIONS  07/21/2013   Procedure: LAPAROSCOPIC LYSIS OF ADHESIONS;  Surgeon: Madilyn Hook, DO;  [facility];  Service: General;;  . LAPAROSCOPIC REPAIR AND REMOVAL OF GASTRIC BAND N/A 05/05/2013   Procedure: LAPAROSCOPIC REMOVAL OF ADJUSTABLE GASTRIC BAND AND PORT, ENDOSCOPY ;  Surgeon: Madilyn Hook, DO;  [facility];  Service: General;  Laterality: N/A;  laparoscopic removal of adjustable gastric band AND PORT   . UPPER GI ENDOSCOPY  07/21/2013   Procedure: UPPER GI ENDOSCOPY;  Surgeon: Madilyn Hook, DO;  [facility];  Service: General;;    There were no vitals filed for this visit.  Subjective Assessment -  07/28/19 1600    Subjective  Pt reports no change since evaluation    Limitations  Lifting;Standing;Walking    Patient Stated Goals  be stronger, have less pain    Currently in Pain?  Yes    Pain Score  6     Pain Location  Back                       [facility] Adult PT Treatment/Exercise - 07/28/19 0001      Exercises   Exercises  Lumbar      Lumbar Exercises: Stretches   Passive Hamstring Stretch  Left;Right;5 reps;10 seconds;20 seconds    Single Knee to Chest Stretch  Left;Right;4 reps;10 seconds    Piriformis Stretch  Left;Right;3 reps;10 seconds      Lumbar Exercises: Aerobic   Nustep  L4 x6 min       Lumbar Exercises: Standing   Row  Theraband;20 reps;Both    Theraband Level (Row)  Level 2 (Red)    Shoulder Extension  Theraband;20 reps;Both    Theraband Level (Shoulder Extension)  Level 2 (Red)    Other Standing Lumbar Exercises  Hip ext & abd x 10 each       Lumbar Exercises: Seated   Sit to Stand  5 reps   Airex on mat table, x2      Lumbar Exercises: Supine   Ab Set  20  reps;2 seconds    Bridge  Compliant;10 reps;2 seconds      Modalities   Modalities  Electrical Stimulation;Moist Heat      Moist Heat Therapy   Number Minutes Moist Heat  15 Minutes    Moist Heat Location  Lumbar Spine      Electrical Stimulation   Electrical Stimulation Location  right low back into the buttock    Electrical Stimulation Action  IFC    Electrical Stimulation Parameters  supine    Electrical Stimulation Goals  Pain               PT Short Term Goals - 07/20/19 1636      PT SHORT TERM GOAL #1   Title  independent with initial HEP    Time  2    Period  Weeks    Status  New        PT Long Term Goals - 07/20/19 1636      PT LONG TERM GOAL #1   Title  understand posture and body mechanics    Time  8    Period  Weeks    Status  New      PT LONG TERM GOAL #2   Title  decrease pain50%    Time  8    Period  Weeks    Status  New      PT LONG  TERM GOAL #3   Title  increase ROM 25%    Time  8    Period  Weeks    Status  New      PT LONG TERM GOAL #4   Title  increase right SLR to 60 degrees    Time  8    Period  Weeks    Status  New            Plan - 07/28/19 1644    Clinical Impression Statement  Pt tolerated an initial progression to TE well. No report of pain with today's interventions. Tactile cues needed to not lean with all standing hip exercises. She did report some low back tightness with hip extensions. Postural cues needed with standing rows and extensions. Cues to engage core with ab sets.    Stability/Clinical Decision Making  Stable/Uncomplicated    Rehab Potential  Good    PT Frequency  2x / week    PT Duration  8 weeks    PT Treatment/Interventions  ADLs/Self Care Home Management;Cryotherapy;Electrical Stimulation;Iontophoresis 4mg /ml Dexamethasone;Moist Heat;Traction;Ultrasound;Therapeutic activities;Gait training;Therapeutic exercise;Functional mobility training;Balance training;Neuromuscular re-education;Patient/family education;Manual techniques;Dry needling    PT Next Visit Plan  work on core stability and flexibility       Patient will benefit from skilled therapeutic intervention in order to improve the following deficits and impairments:  Improper body mechanics, Pain, Increased muscle spasms, Decreased range of motion, Decreased strength, Impaired flexibility, Difficulty walking  Visit Diagnosis: Acute right-sided low back pain with right-sided sciatica  Muscle spasm of back     Problem List Patient Active Problem List   Diagnosis Date Noted  . Lumbar radiculopathy, right 07/15/2019  . Hyperlipidemia 11/28/2018  . TIA (transient ischemic attack) 10/21/2018  . Essential hypertension 09/24/2018  . Chest pain at rest   . Degenerative joint disease of knee, right 05/08/2018  . Pain of left heel 02/23/2013  . Bacterial vaginosis 12/25/2011  . Allergic rhinitis 12/25/2011  . Lung nodule  08/24/2011  . Atypical chest pain 08/22/2011  . Depression 08/22/2011  . Preventative health care 08/22/2011  Grayce Sessions, PTA 07/28/2019, 4:46 PM  [facility] [address] [place], [place], [address] Phone: [phone]   Fax:  [phone]  Name: Lachele Gopalakrishnan MRN: [id number] Date of Birth: Oct 27, 1966

## 2019-07-30 ENCOUNTER — Ambulatory Visit: Payer: BC Managed Care – PPO | Admitting: Physical Therapy

## 2019-08-04 ENCOUNTER — Ambulatory Visit: Payer: BC Managed Care – PPO | Admitting: Physical Therapy

## 2019-08-06 ENCOUNTER — Encounter: Payer: Self-pay | Admitting: Physical Therapy

## 2019-08-06 ENCOUNTER — Other Ambulatory Visit: Payer: Self-pay

## 2019-08-06 ENCOUNTER — Ambulatory Visit: Payer: BC Managed Care – PPO | Admitting: Physical Therapy

## 2019-08-06 DIAGNOSIS — M5441 Lumbago with sciatica, right side: Secondary | ICD-10-CM

## 2019-08-06 DIAGNOSIS — M62830 Muscle spasm of back: Secondary | ICD-10-CM

## 2019-08-06 NOTE — Therapy (Signed)
[facility] [place], [place], [address] Phone: [phone]   Fax:  [phone]  Physical Therapy Treatment  Patient Details  Name: Debra Carpenter MRN: [id number] Date of Birth: 12-28-1965 Referring Provider (PT): Creig Hines   Encounter Date: 08/06/2019  PT End of Session - 08/06/19 1651    Visit Number  3    Date for PT Re-Evaluation  09/19/19    PT Start Time  1605    PT Stop Time  1704    PT Time Calculation (min)  59 min    Activity Tolerance  Patient tolerated treatment well    Behavior During Therapy  [facility] for tasks assessed/performed       Past Medical History:  Diagnosis Date  . Anginal pain ([place]) 9/12   states negative cardiology workup exception of murmur  . Anxiety and depression   . Bony sclerosis 08/22/2011  . GERD (gastroesophageal reflux disease)   . Heart murmur   . Hypertension   . Lung nodule 08/24/2011    Past Surgical History:  Procedure Laterality Date  . ABDOMINAL HYSTERECTOMY  2007  . LAPAROSCOPIC GASTRIC BANDING  2011  . LAPAROSCOPIC GASTRIC SLEEVE RESECTION N/A 07/21/2013   Procedure: LAPAROSCOPIC GASTRIC SLEEVE RESECTION, LYSIS OF ADHESIONS, EGD;  Surgeon: Madilyn Hook, DO;  [facility];  Service: General;  Laterality: N/A;  . LAPAROSCOPIC LYSIS OF ADHESIONS  07/21/2013   Procedure: LAPAROSCOPIC LYSIS OF ADHESIONS;  Surgeon: Madilyn Hook, DO;  [facility];  Service: General;;  . LAPAROSCOPIC REPAIR AND REMOVAL OF GASTRIC BAND N/A 05/05/2013   Procedure: LAPAROSCOPIC REMOVAL OF ADJUSTABLE GASTRIC BAND AND PORT, ENDOSCOPY ;  Surgeon: Madilyn Hook, DO;  [facility];  Service: General;  Laterality: N/A;  laparoscopic removal of adjustable gastric band AND PORT   . UPPER GI ENDOSCOPY  07/21/2013   Procedure: UPPER GI ENDOSCOPY;  Surgeon: Madilyn Hook, DO;  [facility];  Service: General;;    There were no vitals filed for this visit.  Subjective Assessment -  08/06/19 1607    Subjective  "Im ok" Rode her bike 5 miles yesterday, R knee is bothering    Limitations  Lifting;Standing;Walking    Patient Stated Goals  be stronger, have less pain    Currently in Pain?  Yes    Pain Score  5     Pain Location  Knee    Pain Orientation  Right                       [facility] Adult PT Treatment/Exercise - 08/06/19 0001      Lumbar Exercises: Stretches   Passive Hamstring Stretch  Left;Right;10 seconds;20 seconds;3 reps    Single Knee to Chest Stretch  Left;Right;4 reps;10 seconds    Double Knee to Chest Stretch  4 reps;10 seconds    Lower Trunk Rotation  2 reps;20 seconds    Piriformis Stretch  Left;Right;3 reps;10 seconds      Lumbar Exercises: Aerobic   Nustep  L4 x6 min       Lumbar Exercises: Machines for Strengthening   Cybex Knee Extension  5lb 2x10     Cybex Knee Flexion  20lb 2x10    Leg Press  30lb 2x10     Other Lumbar Machine Exercise  Rows and lats 25lb 2x10       Lumbar Exercises: Standing   Row  Theraband;20 reps;Both    Theraband Level (Row)  Level 3 (Green)    Shoulder Extension  Theraband;20 reps;Both    Theraband Level (Shoulder Extension)  Level 3 (Green)      Modalities   Modalities  Electrical Stimulation;Moist Heat      Moist Heat Therapy   Number Minutes Moist Heat  15 Minutes    Moist Heat Location  Lumbar Spine      Electrical Stimulation   Electrical Stimulation Location  right low back into the buttock    Electrical Stimulation Action  IFC    Electrical Stimulation Parameters  supine    Electrical Stimulation Goals  Pain               PT Short Term Goals - 08/06/19 1654      PT SHORT TERM GOAL #1   Title  independent with initial HEP    Status  Achieved        PT Long Term Goals - 08/06/19 1654      PT LONG TERM GOAL #1   Title  understand posture and body mechanics    Status  Partially Met      PT LONG TERM GOAL #2   Title  decrease pain50%    Status  On-going             Plan - 08/06/19 1651    Clinical Impression Statement  Progressed pt to some machine level interventions without reports of increase pain. She did however report som increase low back tightness after leg press. This tightness was relieved after stretching. Postural cues required with standing shoulder extensions to keep head up and to engage core.    Stability/Clinical Decision Making  Stable/Uncomplicated    PT Treatment/Interventions  ADLs/Self Care Home Management;Cryotherapy;Electrical Stimulation;Iontophoresis 38m/ml Dexamethasone;Moist Heat;Traction;Ultrasound;Therapeutic activities;Gait training;Therapeutic exercise;Functional mobility training;Balance training;Neuromuscular re-education;Patient/family education;Manual techniques;Dry needling    PT Next Visit Plan  work on core stability and flexibility       Patient will benefit from skilled therapeutic intervention in order to improve the following deficits and impairments:  Improper body mechanics, Pain, Increased muscle spasms, Decreased range of motion, Decreased strength, Impaired flexibility, Difficulty walking  Visit Diagnosis: Acute right-sided low back pain with right-sided sciatica  Muscle spasm of back     Problem List Patient Active Problem List   Diagnosis Date Noted  . Lumbar radiculopathy, right 07/15/2019  . Hyperlipidemia 11/28/2018  . TIA (transient ischemic attack) 10/21/2018  . Essential hypertension 09/24/2018  . Chest pain at rest   . Degenerative joint disease of knee, right 05/08/2018  . Pain of left heel 02/23/2013  . Bacterial vaginosis 12/25/2011  . Allergic rhinitis 12/25/2011  . Lung nodule 08/24/2011  . Atypical chest pain 08/22/2011  . Depression 08/22/2011  . Preventative health care 08/22/2011    RScot Jun PTA 08/06/2019, 4:54 PM  [address] [address]Phone:  [phone]  Fax:  [phone] Name: Debra TusingMRN: [id number] of Birth: 805/12/1965

## 2019-08-20 ENCOUNTER — Ambulatory Visit: Payer: BC Managed Care – PPO | Admitting: Physical Therapy

## 2019-10-05 ENCOUNTER — Telehealth: Payer: Self-pay | Admitting: *Deleted

## 2019-10-05 NOTE — Telephone Encounter (Signed)
Offered for patient to see Dr. [name] today. Patient declines and will keep appointment for the 11th.

## 2019-10-05 NOTE — Telephone Encounter (Signed)
Pt left msg stating she would like to be seen today.  Pt already has an appt scheduled on 11/11 with Dr. Tamala Julian.

## 2019-10-07 ENCOUNTER — Encounter: Payer: Self-pay | Admitting: Family Medicine

## 2019-10-07 ENCOUNTER — Other Ambulatory Visit: Payer: Self-pay

## 2019-10-07 ENCOUNTER — Ambulatory Visit: Payer: BC Managed Care – PPO | Admitting: Family Medicine

## 2019-10-07 DIAGNOSIS — M1711 Unilateral primary osteoarthritis, right knee: Secondary | ICD-10-CM | POA: Diagnosis not present

## 2019-10-07 NOTE — Assessment & Plan Note (Signed)
Patient given a steroid injection.  Discussed which activities to do which wants to avoid.  Discussed icing regimen.  Increase activity as tolerated.  Follow-up again 4 weeks.  Could be a candidate for viscosupplementation.  Due to patient's abnormal thigh to calf ratio and instability of the knee will see if patient can be fitted for custom OA brace.

## 2019-10-07 NOTE — Progress Notes (Signed)
Debra Carpenter Debra Carpenter D.O. [facility] [address] [place], [place] [address] Phone: [phone] Subjective:   [name], [name], am serving as a scribe for Dr. Antoine PrimasZachary TRUE [place].  I'm seeing this patient by the request  of:    CC: Low back and right knee pain  BJY:NWGNFAOZHYHPI:Subjective   07/15/2019 Right-sided lumbar radiculopathy with a positive straight leg test.  Started on gabapentin home exercises and formal physical therapy.  Discussed weight loss.  Follow-up again in 4 to 6 weeks.  X-rays pending.  May need advanced imaging.  Patient having another injection today.  Known arthritic changes.  Could be a candidate for Visco supplementation if needed.  Patient now has responded very well previously to the conservative therapy and will do formal physical therapy.  Follow-up again in 4 to 8 weeks  Update 10/07/2019 Debra GenreLisa Carpenter is a 53 y.o. female coming in with complaint of back and right knee pain. Patient states that she has had an increase in her knee pain over the past 2 weeks. Pain occurs with weight bearing. Can have some achy pain when seated. Pain over the patellar tendon.   Continues to use gabapentin at night. Physical therapy did help with back pain. Has been working on [employer] from PT office.      Past Medical History:  Diagnosis Date  . Anginal pain ([facility]) 9/12   states negative cardiology workup exception of murmur  . Anxiety and depression   . Bony sclerosis 08/22/2011  . GERD (gastroesophageal reflux disease)   . Heart murmur   . Hypertension   . Lung nodule 08/24/2011   Past Surgical History:  Procedure Laterality Date  . ABDOMINAL HYSTERECTOMY  2007  . LAPAROSCOPIC GASTRIC BANDING  2011  . LAPAROSCOPIC GASTRIC SLEEVE RESECTION N/A 07/21/2013   Procedure: LAPAROSCOPIC GASTRIC SLEEVE RESECTION, LYSIS OF ADHESIONS, EGD;  Surgeon: Lodema PilotBrian Layton, DO;  [facility];  Service: General;  Laterality: N/A;  . LAPAROSCOPIC LYSIS OF ADHESIONS  07/21/2013   Procedure: LAPAROSCOPIC LYSIS  OF ADHESIONS;  Surgeon: Lodema PilotBrian Layton, DO;  [facility];  Service: General;;  . LAPAROSCOPIC REPAIR AND REMOVAL OF GASTRIC BAND N/A 05/05/2013   Procedure: LAPAROSCOPIC REMOVAL OF ADJUSTABLE GASTRIC BAND AND PORT, ENDOSCOPY ;  Surgeon: Lodema PilotBrian Layton, DO;  [facility];  Service: General;  Laterality: N/A;  laparoscopic removal of adjustable gastric band AND PORT   . UPPER GI ENDOSCOPY  07/21/2013   Procedure: UPPER GI ENDOSCOPY;  Surgeon: Lodema PilotBrian Layton, DO;  [facility];  Service: General;;   Social History   Socioeconomic History  . Marital status: Married    Spouse name: Not on file  . Number of children: 3  . Years of education: Not on file  . Highest education level: Not on file  Occupational History  . Occupation: IT for [employer]    Employer: [employer]  Social Needs  . Financial resource strain: Not on file  . Food insecurity    Worry: Not on file    Inability: Not on file  . Transportation needs    Medical: Not on file    Non-medical: Not on file  Tobacco Use  . Smoking status: Never Smoker  . Smokeless tobacco: Never Used  Substance and Sexual Activity  . Alcohol use: No  . Drug use: No  . Sexual activity: Not Currently    Birth control/protection: Surgical  Lifestyle  . Physical activity    Days per week: Not on file  Minutes per session: Not on file  . Stress: Not on file  Relationships  . Social Herbalist on phone: Not on file    Gets together: Not on file    Attends religious service: Not on file    Active member of club or organization: Not on file    Attends meetings of clubs or organizations: Not on file    Relationship status: Not on file  Other Topics Concern  . 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     3 children- 54 daughter, 4 son, 74 daughter               Allergies  Allergen Reactions  .  Ace Inhibitors Anaphylaxis    Angioedema   . Adhesive [Tape] Hives  . Amlodipine     LEE  . Spironolactone     Weight gain  . Statins     Myalgias with rosuvastatin 5mg  daily, pravastatin 10mg , and atorvastatin   Family History  Problem Relation Age of Onset  . Hypertension Mother   . Hypertension Father   . Cancer Father        ? colon cancer  . Cancer Maternal Grandmother        ovarian cancer  . Hypertension Maternal Grandfather   . Hypertension Paternal Grandfather      Current Outpatient Medications (Cardiovascular):  .  chlorthalidone (HYGROTON) 25 MG tablet, Take 1 tablet (25 mg total) by mouth daily. Please make yearly appt with Dr. Angelena Form for November for future refills. 1st attempt   Current Outpatient Medications (Analgesics):  .  aspirin 81 MG EC tablet, TAKE 1 TABLET BY MOUTH EVERY DAY   Current Outpatient Medications (Other):  .  gabapentin (NEURONTIN) 100 MG capsule, Take 2 capsules (200 mg total) by mouth at bedtime. .  potassium chloride SA (K-DUR) 20 MEQ tablet, Take 1 tablet (20 mEq total) by mouth daily. Please make yearly appt with Dr. Angelena Form for November for future refills. 1st attempt    Past medical history, social, surgical and family history all reviewed in electronic medical record.  No pertanent information unless stated regarding to the chief complaint.   Review of Systems:  No headache, visual changes, nausea, vomiting, diarrhea, constipation, dizziness, abdominal pain, skin rash, fevers, chills, night sweats, weight loss, swollen lymph nodes, body aches, joint swelling, , chest pain, shortness of breath, mood changes.  Positive muscle aches  Objective  Blood pressure 110/78, pulse 85, height 5\' 10"  (1.778 m), weight 267 lb (121.1 kg), SpO2 99 %.    General: No apparent distress alert and oriented x3 mood and affect normal, dressed appropriately.  HEENT: Pupils equal, extraocular movements intact  Respiratory: Patient's speak in full  sentences and does not appear short of breath  Cardiovascular: No lower extremity edema, non tender, no erythema  Skin: Warm dry intact with no signs of infection or rash on extremities or on axial skeleton.  Abdomen: Soft nontender  Neuro: Cranial nerves II through XII are intact, neurovascularly intact in all extremities with 2+ DTRs and 2+ pulses.  Lymph: No lymphadenopathy of posterior or anterior cervical chain or axillae bilaterally.  Gait antalgic MSK:  Non tender with full range of motion and good stability and symmetric strength and tone of shoulders, elbows, wrist, hip, and ankles bilaterally.  Knee: Right valgus deformity noted.  Abnormal thigh to calf ratio.  Tender to palpation over medial and PF joint line.  ROM full in flexion and extension and lower leg rotation. instability with valgus force.  painful patellar compression. Patellar glide with moderate crepitus. Patellar and quadriceps tendons unremarkable. Hamstring and quadriceps strength is normal.  After informed written and verbal consent, patient was seated on exam table. Right knee was prepped with alcohol swab and utilizing anterolateral approach, patient's right knee space was injected with 4:1  marcaine 0.5%: Kenalog 40mg /dL. Patient tolerated the procedure well without immediate complications.      Impression and Recommendations:     This case required medical decision making of moderate complexity. The above documentation has been reviewed and is accurate and complete , DO       Note: This dictation was prepared with Dragon dictation along with smaller phrase technology. Any transcriptional errors that result from this process are unintentional.

## 2019-10-07 NOTE — Patient Instructions (Signed)
Good to see you. Steroid injection given today, We will get you fitted for a custom brace. Getting approval for gel injections. Every pound to lose will be 4 pounds daily. See me again in 3-4 weeks

## 2019-10-12 DIAGNOSIS — Z1231 Encounter for screening mammogram for malignant neoplasm of breast: Secondary | ICD-10-CM | POA: Diagnosis not present

## 2019-10-12 DIAGNOSIS — Z1239 Encounter for other screening for malignant neoplasm of breast: Secondary | ICD-10-CM | POA: Diagnosis not present

## 2019-10-21 DIAGNOSIS — M1711 Unilateral primary osteoarthritis, right knee: Secondary | ICD-10-CM | POA: Diagnosis not present

## 2019-11-03 ENCOUNTER — Encounter: Payer: Self-pay | Admitting: Family Medicine

## 2019-11-03 MED ORDER — CHLORTHALIDONE 25 MG PO TABS: 25.0000 mg | ORAL_TABLET | Freq: Every day | ORAL | 0 refills | Status: DC

## 2019-11-03 NOTE — Telephone Encounter (Signed)
Pt's medication was sent to pt's pharmacy as requested. Confirmation received.  °

## 2019-11-19 NOTE — Progress Notes (Deleted)
Cardiology Office Note   Date:  11/19/2019   ID:  Debra Carpenter, DOB 1966/10/16, MRN [id number]  PCP:  Lauraine Rinne, MD  Cardiologist:  Dr. Angelena Form, MD  No chief complaint on file.   History of Present Illness: Debra Carpenter is a 53 y.o. female who presents for 1 year follow-up, seen for Dr. Angelena Form.  Debra Carpenter has a history of hypertension, GERD and family history of CAD.  She has reportedly been seen by cardiologist several years ago for cardiac evaluation and recalls having a stress test and echocardiogram at which time she was told her studies were normal.  She was last seen for post hospital follow-up for chest pain 10/06/2018.  She reported waking from her sleep with chest pain and no relief with OTC antacids felt to be atypical and that it was not any worse with physical activity with no associated symptoms.  In the ED, she was markedly hypertensive and was started on IV nitroglycerin.  Initial troponin was negative and EKG showed no ischemic abnormalities.  She was admitted to hospitalist service for management of her BP.  Cardiology was consulted and recommended a coronary CTA and echocardiogram in which both studies were found to be unremarkable.  Calcium score was 0 and echocardiogram showed normal LVEF and no regional wall motion abnormalities as well as no valvular disease.  Her metoprolol was continued and enalapril dose was increased to 10 mg.  Chest pain resolved with BP improvement and she was discharged soon thereafter.  At last office visit, she did have what appears to be an angioedema reaction.  She reports she was taken off combination enalapril-HCTZ prescribed enalapril 10 mg.  At follow-up with her PCP she was placed back on combination at which time she had severe swelling of her face and lips and self medicated with Benadryl x2 with resolution.  CRFs include hypertension as well as strong family history of CAD.  Mother had 7 stents, PPM and atrial  fibrillation in which she was first diagnosed with CAD in her 72s.  Her father also has CAD with MI in his 4s.  Paternal grandfather died suddenly from an MI in his 37s.  Today,  1.  Hypertension: -Stable, -Given angioedema with enalapril, enalapril-HCTZ was discontinued at last office visit -Spironolactone 25 mg was added to her regimen of metoprolol 12.5 mg twice daily   2.  History of chest pain: -Previously evaluated by our service during hospitalization for atypical chest pain found to have a normal CTA as well as an echocardiogram with no significant CAD and calcium score of 0 on CTA.  Echocardiogram showed normal LVEF and no wall motion abnormalities. -Chest pain felt to be related to markedly elevated BP on hospital presentation -Plan as above   3.  Angioedema: -In the setting of enalapril treated with Benadryl -ACE I added to allergy list at last office visit with no recurrent issues noted   Past Medical History:  Diagnosis Date  . Anginal pain ([place]) 9/12   states negative cardiology workup exception of murmur  . Anxiety and depression   . Bony sclerosis 08/22/2011  . GERD (gastroesophageal reflux disease)   . Heart murmur   . Hypertension   . Lung nodule 08/24/2011    Past Surgical History:  Procedure Laterality Date  . ABDOMINAL HYSTERECTOMY  2007  . LAPAROSCOPIC GASTRIC BANDING  2011  . LAPAROSCOPIC GASTRIC SLEEVE RESECTION N/A 07/21/2013   Procedure: LAPAROSCOPIC GASTRIC SLEEVE RESECTION, LYSIS OF ADHESIONS,  EGD;  Surgeon: Lodema Pilot, DO;  [facility];  Service: General;  Laterality: N/A;  . LAPAROSCOPIC LYSIS OF ADHESIONS  07/21/2013   Procedure: LAPAROSCOPIC LYSIS OF ADHESIONS;  Surgeon: Lodema Pilot, DO;  [facility];  Service: General;;  . LAPAROSCOPIC REPAIR AND REMOVAL OF GASTRIC BAND N/A 05/05/2013   Procedure: LAPAROSCOPIC REMOVAL OF ADJUSTABLE GASTRIC BAND AND PORT, ENDOSCOPY ;  Surgeon: Lodema Pilot, DO;  [facility];  Service: General;   Laterality: N/A;  laparoscopic removal of adjustable gastric band AND PORT   . UPPER GI ENDOSCOPY  07/21/2013   Procedure: UPPER GI ENDOSCOPY;  Surgeon: Lodema Pilot, DO;  [facility];  Service: General;;     Current Outpatient Medications  Medication Sig Dispense Refill  . aspirin 81 MG EC tablet TAKE 1 TABLET BY MOUTH EVERY DAY 30 tablet 11  . chlorthalidone (HYGROTON) 25 MG tablet Take 1 tablet (25 mg total) by mouth daily. Please make overdue appt with Dr. Clifton James before anymore refills. 2nd attempt 15 tablet 0  . gabapentin (NEURONTIN) 100 MG capsule Take 2 capsules (200 mg total) by mouth at bedtime. 180 capsule 3  . potassium chloride SA (K-DUR) 20 MEQ tablet Take 1 tablet (20 mEq total) by mouth daily. Please make yearly appt with Dr. Clifton James for November for future refills. 1st attempt 90 tablet 0   No current facility-administered medications for this visit.    Allergies:   Ace inhibitors, Adhesive [tape], Amlodipine, Spironolactone, and Statins    Social History:  The patient  reports that she has never smoked. She has never used smokeless tobacco. She reports that she does not drink alcohol or use drugs.   Family History:  The patient's ***family history includes Cancer in her father and maternal grandmother; Hypertension in her father, maternal grandfather, mother, and paternal grandfather.    ROS:  Please see the history of present illness.   Otherwise, review of systems are positive for {NONE DEFAULTED:18576::"none"}.   All other systems are reviewed and negative.    PHYSICAL EXAM: VS:  There were no vitals taken for this visit. , BMI There is no height or weight on file to calculate BMI. GEN: Well nourished, well developed, in no acute distress HEENT: normal Neck: no JVD, carotid bruits, or masses Cardiac: ***RRR; no murmurs, rubs, or gallops,no edema  Respiratory:  clear to auscultation bilaterally, normal work of breathing GI: soft, nontender, nondistended, +  BS MS: no deformity or atrophy Skin: warm and dry, no rash Neuro:  Strength and sensation are intact Psych: euthymic mood, full affect   EKG:  EKG {ACTION; IS/IS KCL:[id number]} ordered today. The ekg ordered today demonstrates ***   Recent Labs: 01/27/2019: ALT 13 06/02/2019: BUN 15; Creatinine, Ser 0.54; Potassium 3.6; Sodium 141   Lipid Panel    Component Value Date/Time   CHOL 186 04/30/2019 0951   TRIG 86 04/30/2019 0951   HDL 58 04/30/2019 0951   CHOLHDL 3.2 04/30/2019 0951   CHOLHDL 3.0 09/24/2018 1153   VLDL 7 09/24/2018 1153   LDLCALC 111 (H) 04/30/2019 0951   LDLDIRECT 111 (H) 01/27/2019 0859      Wt Readings from Last 3 Encounters:  10/07/19 267 lb (121.1 kg)  07/15/19 261 lb (118.4 kg)  10/06/18 250 lb 1.9 oz (113.5 kg)    Other studies Reviewed: Additional studies/ records that were reviewed today include:   Coronary CTA 09/25/18 Coronary arteries: Normal coronary origins. Right dominance.  Right Coronary Artery: No significant plaque or  stenosis. Motion artifact degrades the assessment of the mid-RCA, however assessment in best systolic phase suggests no significant stenosis. Possible tortuosity in the mid RCA. Cannot exclude proximal occlusion of PDA, however this may also result from motion artifact, since distal vessel is widely patent. PL branch is patent but small caliber.  Left Main Coronary Artery: No significant plaque or stenosis.  Left Anterior Descending Coronary Artery: Minimal tubular stenosis of the proximal LAD, <25% stenosis. Mid and distal vessel are widely patent with tortuosity. Patent diagonal branches and septal perforators.  Left Circumflex Artery: No detectable plaque or stenosis. Small caliber vessel. OM1 is patent but small.  Aorta: Normal size. No calcifications. No dissection.  Aortic Valve: No calcifications. Morphology cannot be assessed due to inability to visualize valve opening in systole.  Other  findings:  Normal pulmonary vein drainage into the left atrium.  Normal let atrial appendage without a thrombus.  Normal size of the main pulmonary artery, mildly dilated left pulmonary artery.  IMPRESSION: 1. Coronary calcium score of 0. This was 0 percentile for age and sex matched control.  2. Normal coronary origin with right dominance.  3. Minimal CAD in the proximal LAD, CADRADS = 1. Tortuosity of the LAD and likely RCA. Canont exclude mid-RCA or proximal PDA stenosis, however, this is more likely secondary to motion artifact given the patent distal vessel.  2D Echo 09/24/18 Left ventricle: The cavity size was normal. Systolic function was normal. The estimated ejection fraction was in the range of 60% to 65%. Wall motion was normal; there were no regional wall motion abnormalities. The transmitral flow pattern was normal. The deceleration time of the early transmitral flow velocity was normal. The pulmonary vein flow pattern was normal. The tissue Doppler parameters were normal. Left ventricular diastolic function parameters were normal.  ASSESSMENT AND PLAN:  1.  ***   Current medicines are reviewed at length with the patient today.  The patient {ACTIONS; HAS/DOES NOT HAVE:19233} concerns regarding medicines.  The following changes have been made:  {PLAN; NO CHANGE:13088:s}  Labs/ tests ordered today include: *** No orders of the defined types were placed in this encounter.    Disposition:   FU with *** in {gen number 1-61:096045}0-10:310397} {Days to years:10300}  Signed, Georgie ChardJill Harshan Kearley, NP  11/19/2019 5:51 AM    [facility] [address], [place], [place]  [address] Phone: [phone]; Fax: [phone]

## 2019-11-24 ENCOUNTER — Ambulatory Visit: Payer: BC Managed Care – PPO | Admitting: Cardiology

## 2019-12-02 DIAGNOSIS — Z8673 Personal history of transient ischemic attack (TIA), and cerebral infarction without residual deficits: Secondary | ICD-10-CM | POA: Diagnosis not present

## 2019-12-02 DIAGNOSIS — E782 Mixed hyperlipidemia: Secondary | ICD-10-CM | POA: Diagnosis not present

## 2019-12-02 DIAGNOSIS — I10 Essential (primary) hypertension: Secondary | ICD-10-CM | POA: Diagnosis not present

## 2019-12-02 DIAGNOSIS — R7301 Impaired fasting glucose: Secondary | ICD-10-CM | POA: Diagnosis not present

## 2019-12-02 DIAGNOSIS — Z23 Encounter for immunization: Secondary | ICD-10-CM | POA: Diagnosis not present

## 2019-12-02 DIAGNOSIS — E559 Vitamin D deficiency, unspecified: Secondary | ICD-10-CM | POA: Diagnosis not present

## 2019-12-18 NOTE — Telephone Encounter (Signed)
Patient called with her insurance company on the phone as well trying to get approval for the injections. He said that verbal authorization could be done by calling [phone] or fax at [phone].  Please advise.

## 2019-12-21 ENCOUNTER — Other Ambulatory Visit: Payer: Self-pay

## 2019-12-21 ENCOUNTER — Ambulatory Visit (INDEPENDENT_AMBULATORY_CARE_PROVIDER_SITE_OTHER): Payer: BC Managed Care – PPO

## 2019-12-21 ENCOUNTER — Ambulatory Visit: Payer: Self-pay

## 2019-12-21 ENCOUNTER — Telehealth: Payer: Self-pay

## 2019-12-21 ENCOUNTER — Ambulatory Visit: Payer: BC Managed Care – PPO | Admitting: Family Medicine

## 2019-12-21 ENCOUNTER — Encounter: Payer: Self-pay | Admitting: Family Medicine

## 2019-12-21 VITALS — BP 142/78 | HR 69 | Ht 70.0 in | Wt 267.0 lb

## 2019-12-21 DIAGNOSIS — M25561 Pain in right knee: Secondary | ICD-10-CM | POA: Diagnosis not present

## 2019-12-21 DIAGNOSIS — M1711 Unilateral primary osteoarthritis, right knee: Secondary | ICD-10-CM | POA: Diagnosis not present

## 2019-12-21 MED ORDER — HYDROCODONE-ACETAMINOPHEN 5-325 MG PO TABS: 1.0000 | ORAL_TABLET | Freq: Four times a day (QID) | ORAL | 0 refills | Status: DC | PRN

## 2019-12-21 MED ORDER — AMBULATORY NON FORMULARY MEDICATION: 0 refills | Status: DC

## 2019-12-21 NOTE — Telephone Encounter (Signed)
Left message for patient to call back to schedule appointment with Dr. Katrinka Blazing for Thursday at 3:30pm.

## 2019-12-21 NOTE — Patient Instructions (Addendum)
Thank you for coming in today. Call or go to the ER if you develop a large red swollen joint with extreme pain or oozing puss.  Use a walker. You can get one from [facility].  Let me know how you re doing.  If not much better next step is MRI.    Meniscus Tear  A meniscus tear is a knee injury that happens when a piece of the meniscus is torn. The meniscus is a thick, rubbery, wedge-shaped cartilage in the knee. Two menisci are located in each knee. They sit between the upper bone (femur) and lower bone (tibia) that make up the knee joint. Each meniscus acts as a shock absorber for the knee. A torn meniscus is one of the most common types of knee injuries. This injury can range from mild to severe. Surgery may be needed to repair a severe tear. What are the causes? This condition may be caused by any kneeling, squatting, twisting, or pivoting movement. Sports-related injuries are the most common cause. These often occur from:  Running and stopping suddenly. ? Changing direction. ? Being tackled or knocked off your feet.  Lifting or carrying heavy weights. As people get older, their menisci get thinner and weaker. In these people, tears can happen more easily, such as from climbing stairs. What increases the risk? You are more likely to develop this condition if you:  Play contact sports.  Have a job that requires kneeling or squatting.  Are female.  Are over 32 years old. What are the signs or symptoms? Symptoms of this condition include:  Knee pain, especially at the side of the knee joint. You may feel pain when the injury occurs, or you may only hear a pop and feel pain later.  A feeling that your knee is clicking, catching, locking, or giving way.  Not being able to fully bend or extend your knee.  Bruising or swelling in your knee. How is this diagnosed? This condition may be diagnosed based on your symptoms and a physical exam. You may also have tests, such  as:  X-rays.  MRI.  A procedure to look inside your knee with a narrow surgical telescope (arthroscopy). You may be referred to a knee specialist (orthopedic surgeon). How is this treated? Treatment for this injury depends on the severity of the tear. Treatment for a mild tear may include:  Rest.  Medicine to reduce pain and swelling. This is usually a nonsteroidal anti-inflammatory drug (NSAID), like ibuprofen.  A knee brace, sleeve, or wrap.  Using crutches or a walker to keep weight off your knee and to help you walk.  Exercises to strengthen your knee (physical therapy). You may need surgery if you have a severe tear or if other treatments are not working. Follow these instructions at home: If you have a brace, sleeve, or wrap:  Wear it as told by your health care provider. Remove it only as told by your health care provider.  Loosen the brace, sleeve, or wrap if your toes tingle, become numb, or turn cold and blue.  Keep the brace, sleeve, or wrap clean and dry.  If the brace, sleeve, or wrap is not waterproof: ? Do not let it get wet. ? Cover it with a watertight covering when you take a bath or shower. Managing pain and swelling   Take over-the-counter and prescription medicines only as told by your health care provider.  If directed, put ice on your knee: ? If you have  a removable brace, sleeve, or wrap, remove it as told by your health care provider. ? Put ice in a plastic bag. ? Place a towel between your skin and the bag. ? Leave the ice on for 20 minutes, 2-3 times per day.  Move your toes often to avoid stiffness and to lessen swelling.  Raise (elevate) the injured area above the level of your heart while you are sitting or lying down. Activity  Do not use the injured limb to support your body weight until your health care provider says that you can. Use crutches or a walker as told by your health care provider.  Return to your normal activities as told  by your health care provider. Ask your health care provider what activities are safe for you.  Perform range-of-motion exercises only as told by your health care provider.  Begin doing exercises to strengthen your knee and leg muscles only as told by your health care provider. After you recover, your health care provider may recommend these exercises to help prevent another injury. General instructions  Use a knee brace, sleeve, or wrap as told by your health care provider.  Ask your health care provider when it is safe to drive if you have a brace, sleeve, or wrap on your knee.  Do not use any products that contain nicotine or tobacco, such as cigarettes, e-cigarettes, and chewing tobacco. If you need help quitting, ask your health care provider.  Ask your health care provider if the medicine prescribed to you: ? Requires you to avoid driving or using heavy machinery. ? Can cause constipation. You may need to take these actions to prevent or treat constipation:  Drink enough fluid to keep your urine pale yellow.  Take over-the-counter or prescription medicines.  Eat foods that are high in fiber, such as beans, whole grains, and fresh fruits and vegetables.  Limit foods that are high in fat and processed sugars, such as fried or sweet foods.  Keep all follow-up visits as told by your health care provider. This is important. Contact a health care provider if:  You have a fever.  Your knee becomes red, tender, or swollen.  Your pain medicine is not helping.  Your symptoms get worse or do not improve after 2 weeks of home care. Summary  A meniscus tear is a knee injury that happens when a piece of the meniscus is torn.  Treatment for this injury depends on the severity of the tear. You may need surgery if you have a severe tear or if other treatments are not working.  Rest, ice, and raise (elevate) your injured knee as told by your health care provider. This will help lessen  pain and swelling.  Contact a health care provider if you have new symptoms, or your symptoms get worse or do not improve after 2 weeks of home care.  Keep all follow-up visits as told by your health care provider. This is important. This information is not intended to replace advice given to you by your health care provider. Make sure you discuss any questions you have with your health care provider. Document Revised: 05/27/2018 Document Reviewed: 05/27/2018 Elsevier Patient Education  [place].

## 2019-12-21 NOTE — Progress Notes (Signed)
I, Christoper Fabian, LAT, ATC, am serving as scribe for Dr. Clementeen Graham.  Debra Carpenter is a 54 y.o. female who presents to [facility] at [facility] today for f/u of R knee pain.  Pt was last seen by Dr. Katrinka Blazing on 10/07/19 for R knee pain and low back pain.  She had a R knee injection on 10/07/19.  Pt's insurance has denied viscosupplementation per pt message in chart on 11/03/19.  Since her last visit w/ Dr. Katrinka Blazing, pt reports that something "cracked" in her R knee yesterday when she was standing and turned to the R.  She had immediate pain and difficulty bearing weight on the R LE.  Her pain is unchanged from yesterday and rates her pain at a 10/10 and describes her pain as sharp. She has swelling in the R knee.  She has tried alternating ice and heat and has tried compression.  She hast taken Tylenol arthritis.  She denies any radiating pain or numbness/tingling in the R LE.   Pertinent review of systems: No fevers or chills  Relevant historical information: History of right knee DJD   Exam:  BP (!) 142/78 (BP Location: Left Arm, Patient Position: Sitting, Cuff Size: Large)   Pulse 69   Ht 5\' 10"  (1.778 m)   Wt 267 lb (121.1 kg)   SpO2 99%   BMI 38.31 kg/m  General: Well Developed, well nourished, and in no acute distress.   MSK:  Right knee: Mild effusion otherwise normal-appearing with no erythema. Range of motion 5-100 degrees. Mild crepitation with motion. Tender palpation lateral joint line.  Otherwise nontender. Guarding with ligament exam testing. Guarding with McMurray's testing positive lateral however. Strength to flexion extension intact but slightly weak 4/5.  Patient guarding and painful with strength testing.      Lab and Radiology Results  Limited musculoskeletal ultrasound right knee: Quad tendon intact with no tears.  Mild joint effusion present. Patella tendon intact with no tears.  Mild prepatellar bursitis present at distal portion of patella  tendon. Medial joint line narrowed with no obvious visible meniscus tear. Lateral joint line narrowed with significant osteophyte formation at proximal lateral tibia. Meniscus this appears to be extruded at lateral joint line. Impression: Lateral meniscus tear.  DJD.  Procedure: Real-time Ultrasound Guided Injection of right knee Device: Philips Affiniti 50G Images permanently stored and available for review in the ultrasound unit. Verbal informed consent obtained.  Discussed risks and benefits of procedure. Warned about infection bleeding damage to structures skin hypopigmentation and fat atrophy among others. Patient expresses understanding and agreement Time-out conducted.   Noted no overlying erythema, induration, or other signs of local infection.   Skin prepped in a sterile fashion.   Local anesthesia: Topical Ethyl chloride.   With sterile technique and under real time ultrasound guidance:  40 mg of Depo-Medrol and 4 mL of Marcaine injected easily.   Completed without difficulty   Pain immediately resolved suggesting accurate placement of the medication.   Advised to call if fevers/chills, erythema, induration, drainage, or persistent bleeding.   Images permanently stored and available for review in the ultrasound unit.  Impression: Technically successful ultrasound guided injection.    X-ray right knee obtained today personally and independently reviewed DJD most severe patellofemoral joint moderate to mild lateral joint.  No visible fractures per my interpretation. Await formal radiology review  Assessment and Plan: 54 y.o. female with right knee pain acute exacerbation of chronic pain.  Most likely diagnosis at  this time is acute lateral meniscus tear.  This certainly occurs in the setting of existing DJD.  Plan for steroid injection as above walker home exercise program and Norco.  If not improving rapidly following injection we will proceed with MRI.  Patient will notify me.   Precautions reviewed.   PDMP reviewed during this encounter. Orders Placed This Encounter  Procedures  . DG Knee AP/LAT W/Sunrise Right    Standing Status:   Future    Number of Occurrences:   1    Standing Expiration Date:   02/17/2021    Order Specific Question:   Reason for Exam (SYMPTOM  OR DIAGNOSIS REQUIRED)    Answer:   R knee pain    Order Specific Question:   Is patient pregnant?    Answer:   No    Order Specific Question:   Preferred imaging location?    Answer:   [facility]  . [place] LIMITED JOINT SPACE STRUCTURES LOW RIGHT(NO LINKED CHARGES)    Order Specific Question:   Reason for Exam (SYMPTOM  OR DIAGNOSIS REQUIRED)    Answer:   R knee pain    Order Specific Question:   Preferred imaging location?    Answer:   [place]   Meds ordered this encounter  Medications  . HYDROcodone-acetaminophen (NORCO/VICODIN) 5-325 MG tablet    Sig: Take 1 tablet by mouth every 6 (six) hours as needed.    Dispense:  15 tablet    Refill:  0  . AMBULATORY NON FORMULARY MEDICATION    Sig: Walker.Use as needed.  Disp 1 M25.561    Dispense:  1 each    Refill:  0     Discussed warning signs or symptoms. Please see discharge instructions. Patient expresses understanding.   The above documentation has been reviewed and is accurate and complete [name]

## 2019-12-21 NOTE — Telephone Encounter (Signed)
Patient called to follow up on Mychart message about trying to be fit in today for an emergency appointment today.

## 2019-12-22 NOTE — Progress Notes (Signed)
X-ray right knee shows arthritis but no fractures.  If not improving following injection next step will be MRI.

## 2019-12-23 ENCOUNTER — Telehealth: Payer: Self-pay | Admitting: *Deleted

## 2019-12-23 DIAGNOSIS — M25561 Pain in right knee: Secondary | ICD-10-CM

## 2019-12-23 NOTE — Telephone Encounter (Signed)
Pt called stating that she saw Dr. Denyse Amass on Monday and she is still continuing to have knee pain and is requesting an MRI.

## 2019-12-24 NOTE — Telephone Encounter (Signed)
MRI knee ordered. Based on current weight times I can get you in much faster at [facility] than in [place].  If this is going to be a problem please let me know so we can change the location check by [facility] base location.  Additionally do you need medications for claustrophobia during MRI?  If so I can send it into the pharmacy.

## 2019-12-24 NOTE — Telephone Encounter (Signed)
Patient called back to follow up. Given MD response. She is fine with Kathryne Sharper and does not need any medication prescribed.

## 2019-12-24 NOTE — Telephone Encounter (Signed)
Debra Carpenter location is fine for her R knee MRI

## 2019-12-27 ENCOUNTER — Other Ambulatory Visit: Payer: Self-pay

## 2019-12-27 ENCOUNTER — Ambulatory Visit (INDEPENDENT_AMBULATORY_CARE_PROVIDER_SITE_OTHER): Payer: BC Managed Care – PPO

## 2019-12-27 DIAGNOSIS — M25561 Pain in right knee: Secondary | ICD-10-CM

## 2019-12-28 ENCOUNTER — Ambulatory Visit (INDEPENDENT_AMBULATORY_CARE_PROVIDER_SITE_OTHER): Payer: BC Managed Care – PPO | Admitting: Family Medicine

## 2019-12-28 ENCOUNTER — Telehealth: Payer: Self-pay | Admitting: Family Medicine

## 2019-12-28 ENCOUNTER — Encounter: Payer: Self-pay | Admitting: Family Medicine

## 2019-12-28 VITALS — BP 118/76 | HR 76 | Ht 70.0 in

## 2019-12-28 DIAGNOSIS — M25561 Pain in right knee: Secondary | ICD-10-CM | POA: Diagnosis not present

## 2019-12-28 NOTE — Progress Notes (Signed)
MRI knee shows tear at lateral meniscus with medium to in spots more severe arthritis.  Please return to clinic to discuss the results in full detail and to discuss next steps.

## 2019-12-28 NOTE — Patient Instructions (Signed)
Thank you for coming in today.  You should hear from Dr Eliberto Ivory office soon.  Let me know if you have trouble getting in.  Be careful with family gathering coming up.   Continue medicine for pain as needed.

## 2019-12-28 NOTE — Telephone Encounter (Signed)
Patient called asking if the referral that was placed today could be sent to Trudee Grip with [facility]? Please advise.

## 2019-12-28 NOTE — Progress Notes (Signed)
I, Christoper Fabian, LAT, ATC, am serving as scribe for Dr. Clementeen Graham.  Debra Carpenter is a 54 y.o. female who presents to [facility] at [facility] today for f/u of R knee pain and R knee MRI review.  She was last seen by Dr. Denyse Amass on 12/21/19 after injuring her knee on 12/20/19 when she was standing and turned to the R, hearing and feeling a "crack" in her R knee.  She had a R knee injection at her last visit but did not notice much improvement.  She had a R knee MRI on 12/27/19.  Since her last visit, pt reports continued pain and swelling in her R knee.  She has tried alternating ice and heat, compression and Tylenol arthritis.  She notes slight improvement in her knee pain in the fact that she can now put some light pressure through her R LE when walking.  She con't to use a RW to ambulate at home.  She is using her prescription pain medication prn and is using a knee compression sleeve.  First name notes that her mother [name] died recently and will be attending a funeral in [place] at the end of the week.  Pertinent review of systems: No fevers or chills  Relevant historical information: History of hyperlipidemia.  History of right knee DJD.   Exam:  BP 118/76 (BP Location: Left Arm, Patient Position: Sitting, Cuff Size: Large)   Pulse 76   Ht 5\' 10"  (1.778 m)   SpO2 98%   BMI 38.31 kg/m  General: Well Developed, well nourished, and in no acute distress.   MSK: Right knee: Moderate effusion decreased range of motion.  Tender palpation lateral joint line.  Guarding with ligament and McMurray's testing.    Lab and Radiology Results No results found for this or any previous visit (from the past 72 hour(s)). MR Knee Right Wo Contrast  Result Date: 12/27/2019 CLINICAL DATA:  Lateral right knee pain after twisting injury 1 week ago EXAM: MRI OF THE RIGHT KNEE WITHOUT CONTRAST TECHNIQUE: Multiplanar, multisequence MR imaging of the knee was performed. No intravenous  contrast was administered. COMPARISON:  X-ray 12/21/2019 FINDINGS: MENISCI Medial meniscus: Mild intrasubstance degeneration of the body segment. No discrete medial meniscal tear. Lateral meniscus: Maceration of the lateral meniscus centered at the posterior horn-body junction with complex tears with prominent radial and oblique undersurface components. Meniscus is laterally extruded. No displaced meniscal tissue. LIGAMENTS Cruciates:  Intact ACL and PCL. Collaterals: Medial collateral ligament is intact. Lateral collateral ligament complex is intact. CARTILAGE Patellofemoral: High-grade patellofemoral chondral irregularities with extensive full-thickness cartilage loss of the lateral patellar facet and lateral trochlea. Prominent subchondral marrow changes within the lateral trochlea. Medial:  Mild chondral thinning without focal defect. Lateral: Large irregular full-thickness cartilage defects involving the central weight-bearing portions of the lateral femoral condyle and lateral tibial plateau (series 11, images 10-12). Joint:  Trace joint effusion. Mild edema within Hoffa's fat. Popliteal Fossa:  Tiny Baker's cyst. Intact popliteus tendon. Extensor Mechanism:  Intact quadriceps tendon and patellar tendon. Bones: Tricompartmental joint space narrowing with prominent marginal osteophyte formation most pronounced in the lateral and patellofemoral compartments. Degenerative subchondral marrow changes most pronounced within the lateral tibial plateau and lateral trochlea. No fracture line or contour abnormality is seen. Other: None. IMPRESSION: 1. Maceration/complex tearing of the lateral meniscus centered at the posterior horn-body junction. 2. Tricompartmental osteoarthritis, worst in the patellofemoral and lateral tibiofemoral compartments where there is full-thickness cartilage loss and subchondral  marrow changes. 3. Trace joint effusion. Tiny Baker's cyst. Electronically Signed   By: Davina Poke D.O.   On:  12/27/2019 17:06  I, [name], personally (independently) visualized and performed the interpretation of the images attached in this note.      Assessment and Plan: 54 y.o. female with complex right lateral meniscus tear in the setting of moderate to severe isolated DJD/chondromalacia.  She is failing early conservative management and likely will benefit from surgery.  However given her age and the degree of DJD it is likely that her best option is a total knee replacement.  Discussed that it is not clear which is the best option however.  Recommend referral to orthopedic surgery at this point to discuss surgical options.  Patient notes that she has had family and friends see Dr. Wynelle Link at [facility] and she like to go there.   PDMP not reviewed this encounter. Orders Placed This Encounter  Procedures  . Ambulatory referral to Orthopedic Surgery    Referral Priority:   Routine    Referral Type:   Surgical    Referral Reason:   Specialty Services Required    Referred to Provider:   Gaynelle Arabian, MD    Requested Specialty:   Orthopedic Surgery    Number of Visits Requested:   1   No orders of the defined types were placed in this encounter.    Discussed warning signs or symptoms. Please see discharge instructions. Patient expresses understanding.   The above documentation has been reviewed and is accurate and complete [name]

## 2019-12-29 ENCOUNTER — Ambulatory Visit: Payer: BC Managed Care – PPO | Admitting: Family Medicine

## 2019-12-29 NOTE — Telephone Encounter (Signed)
Done in office note dated 12/28/19

## 2019-12-30 ENCOUNTER — Ambulatory Visit: Payer: BC Managed Care – PPO | Admitting: Family Medicine

## 2020-01-13 ENCOUNTER — Encounter: Payer: Self-pay | Admitting: Family Medicine

## 2020-01-15 DIAGNOSIS — M25561 Pain in right knee: Secondary | ICD-10-CM | POA: Diagnosis not present

## 2020-01-28 DIAGNOSIS — M1711 Unilateral primary osteoarthritis, right knee: Secondary | ICD-10-CM | POA: Diagnosis not present

## 2020-02-03 DIAGNOSIS — M1711 Unilateral primary osteoarthritis, right knee: Secondary | ICD-10-CM | POA: Diagnosis not present

## 2020-02-11 DIAGNOSIS — M1711 Unilateral primary osteoarthritis, right knee: Secondary | ICD-10-CM | POA: Diagnosis not present

## 2020-03-14 ENCOUNTER — Other Ambulatory Visit: Payer: Self-pay

## 2020-03-14 ENCOUNTER — Ambulatory Visit: Payer: BC Managed Care – PPO | Admitting: Orthopaedic Surgery

## 2020-03-14 ENCOUNTER — Encounter: Payer: Self-pay | Admitting: Orthopaedic Surgery

## 2020-03-14 VITALS — Ht 69.0 in | Wt 260.0 lb

## 2020-03-14 DIAGNOSIS — M1711 Unilateral primary osteoarthritis, right knee: Secondary | ICD-10-CM | POA: Diagnosis not present

## 2020-03-14 NOTE — Progress Notes (Signed)
Office Visit Note   Patient: Debra Carpenter           Date of Birth: 13-Jun-1966           MRN: [id number] Visit Date: 03/14/2020              Requested by: Lauraine Rinne, MD [place],  [place] [address] PCP: Thornton Dales I, MD   Assessment & Plan: Visit Diagnoses:  1. Unilateral primary osteoarthritis, right knee     Plan: I talked to her in detail about her right knee.  She does have severe valgus malalignment with end-stage arthritis of that knee.  At this point one treatment option will be knee replacement surgery.  I had a long thorough discussion with her about knee replacement surgery showing her knee model and explaining in detail what the surgery involves.  I talked about the risk and benefits of surgery and what to expect interoperative and postoperative.  All questions and concerns were answered and addressed.  She will let us know if she would like [place] to schedule the surgery for her.  She is welcome to call any time for [place] to answer questions and is well.  Follow-Up Instructions: Return if symptoms worsen or fail to improve.   Orders:  No orders of the defined types were placed in this encounter.  No orders of the defined types were placed in this encounter.     Procedures: No procedures performed   Clinical Data: No additional findings.   Subjective: Chief Complaint  Patient presents with  . Right Knee - Pain  The patient is a very pleasant 54 year old female who is sent my way for second opinion as it relates to her right knee.  She does see Sherene Sires, MD regular for this knee.  She is also seen Dr. Wynelle Link in town for her knee.  Her right knee has been hurting for many years now.  Is been getting worse for 2 to 3 years.  She has had multiple steroid injections and hyaluronic acid in that knee.  She does have valgus malalignment.  It hurts on a daily basis and she has a MRI that was recently read is bone-on-bone wear in the lateral and  patellofemoral compartments of her knee.  At this point her pain is become daily and is getting worse.  Her right knee pain is detrimentally affecting her actives daily living, her quality of life and her mobility.  She is not a diabetic.  She has no other active medical issues other than her BMI is 38.  HPI  Review of Systems She currently denies any headache, chest pain, shortness of breath, fever, chills, nausea, vomiting  Objective: Vital Signs: Ht 5\' 9"  (1.753 m)   Wt 260 lb (117.9 kg)   BMI 38.40 kg/m   Physical Exam She is alert and orient x3 and in no acute distress Ortho Exam When she walks and I can definitely see that her knees are in valgus malalignment with the right painful knee much worse than the left.  On examination of that right knee, there is valgus malalignment with significant lateral joint line tenderness.  The knee is ligamentously stable with severe patellofemoral crepitation.  She has a slight flexion contracture with that knee as well. Specialty Comments:  No specialty comments available.  Imaging: No results found. The MRI is reviewed of her right knee.  There is complete denuding of cartilage on the lateral compartment of her knee  on both the lateral femoral condyle and the lateral tibial plateau.  There is a significant degenerative meniscal tear with extrusion of the meniscus from the lateral compartment.  Her ACL and PCL are intact.  There is severe denuding of the cartilage of the patellar femoral joint.  PMFS History: Patient Active Problem List   Diagnosis Date Noted  . Unilateral primary osteoarthritis, right knee 03/14/2020  . Lumbar radiculopathy, right 07/15/2019  . Hyperlipidemia 11/28/2018  . TIA (transient ischemic attack) 10/21/2018  . Essential hypertension 09/24/2018  . Chest pain at rest   . Degenerative joint disease of knee, right 05/08/2018  . Pain of left heel 02/23/2013  . Bacterial vaginosis 12/25/2011  . Allergic rhinitis  12/25/2011  . Lung nodule 08/24/2011  . Atypical chest pain 08/22/2011  . Depression 08/22/2011  . Preventative health care 08/22/2011   Past Medical History:  Diagnosis Date  . Anginal pain (HCC) 9/12   states negative cardiology workup exception of murmur  . Anxiety and depression   . Bony sclerosis 08/22/2011  . GERD (gastroesophageal reflux disease)   . Heart murmur   . Hypertension   . Lung nodule 08/24/2011    Family History  Problem Relation Age of Onset  . Hypertension Mother   . Hypertension Father   . Cancer Father        ? colon cancer  . Cancer Maternal Grandmother        ovarian cancer  . Hypertension Maternal Grandfather   . Hypertension Paternal Grandfather     Past Surgical History:  Procedure Laterality Date  . ABDOMINAL HYSTERECTOMY  2007  . LAPAROSCOPIC GASTRIC BANDING  2011  . LAPAROSCOPIC GASTRIC SLEEVE RESECTION N/A 07/21/2013   Procedure: LAPAROSCOPIC GASTRIC SLEEVE RESECTION, LYSIS OF ADHESIONS, EGD;  Surgeon: Lodema Pilot, DO;  [facility];  Service: General;  Laterality: N/A;  . LAPAROSCOPIC LYSIS OF ADHESIONS  07/21/2013   Procedure: LAPAROSCOPIC LYSIS OF ADHESIONS;  Surgeon: Lodema Pilot, DO;  [facility];  Service: General;;  . LAPAROSCOPIC REPAIR AND REMOVAL OF GASTRIC BAND N/A 05/05/2013   Procedure: LAPAROSCOPIC REMOVAL OF ADJUSTABLE GASTRIC BAND AND PORT, ENDOSCOPY ;  Surgeon: Lodema Pilot, DO;  [facility];  Service: General;  Laterality: N/A;  laparoscopic removal of adjustable gastric band AND PORT   . UPPER GI ENDOSCOPY  07/21/2013   Procedure: UPPER GI ENDOSCOPY;  Surgeon: Lodema Pilot, DO;  [facility];  Service: General;;   Social History   Occupational History  . 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 Smoking status: Never Smoker  . Smokeless tobacco: Never Used  Substance and Sexual Activity  . Alcohol use: No  . Drug use: No  . Sexual activity: Not Currently    Birth  control/protection: Surgical

## 2020-03-25 ENCOUNTER — Other Ambulatory Visit: Payer: Self-pay

## 2020-03-30 ENCOUNTER — Other Ambulatory Visit: Payer: Self-pay | Admitting: Physician Assistant

## 2020-03-30 NOTE — Progress Notes (Addendum)
DUE TO COVID-19 ONLY ONE VISITOR IS ALLOWED TO COME WITH YOU AND STAY IN THE WAITING ROOM ONLY DURING PRE OP AND PROCEDURE DAY OF SURGERY. THE 1 VISITOR MAY VISIT WITH YOU AFTER SURGERY IN YOUR PRIVATE ROOM DURING VISITING HOURS ONLY!  YOU NEED TO HAVE A COVID 19 TEST ON____5/11/21___ @_______ , THIS TEST MUST BE DONE BEFORE SURGERY, COME  [address], [place] , .  [facility]) ONCE YOUR COVID TEST IS COMPLETED, PLEASE BEGIN THE QUARANTINE INSTRUCTIONS AS OUTLINED IN YOUR HANDOUT.                Debra Carpenter  03/30/2020   Your procedure is scheduled on:  04/08/20  Report to [facility] Main  Entrance   Report to admitting at   0600am      Call this number if you have problems the morning of surgery [phone]    Remember: Do not eat food   :After Midnight. BRUSH YOUR TEETH MORNING OF SURGERY AND RINSE YOUR MOUTH OUT, NO CHEWING GUM CANDY OR MINTS.     Take these medicines the morning of surgery with A SIP OF WATER: Zyrtec                                  You may not have any metal on your body including hair pins and              piercings  Do not wear jewelry, make-up, lotions, powders or perfumes, deodorant             Do not wear nail polish on your fingernails.  Do not shave  48 hours prior to surgery.              Men may shave face and neck.   Do not bring valuables to the hospital. [facility] IS NOT             RESPONSIBLE   FOR VALUABLES.  Contacts, dentures or bridgework may not be worn into surgery.  Leave suitcase in the car. After surgery it may be brought to your room.     Patients discharged the day of surgery will not be allowed to drive home. IF YOU ARE HAVING SURGERY AND GOING HOME THE SAME DAY, YOU MUST HAVE AN ADULT TO DRIVE YOU HOME AND BE WITH YOU FOR 24 HOURS. YOU MAY GO HOME BY TAXI OR UBER OR ORTHERWISE, BUT AN ADULT MUST ACCOMPANY YOU HOME AND STAY WITH YOU FOR 24 HOURS.  Name and phone number of your  driver:  Special Instructions: N/A              Please read over the following fact sheets you were given: _____________________________________________________________________             NO SOLID FOOD AFTER MIDNIGHT THE NIGHT PRIOR TO SURGERY. NOTHING BY MOUTH EXCEPT CLEAR LIQUIDS UNTIL  0530am . PLEASE FINISH ENSURE DRINK PER SURGEON ORDER  WHICH NEEDS TO BE COMPLETED AT 0530am.   CLEAR LIQUID DIET   Foods Allowed  Foods Excluded  Coffee and tea, regular and decaf                             liquids that you cannot  Plain Jell-O any favor except red or purple                                           see through such as: Fruit ices (not with fruit pulp)                                     milk, soups, orange juice  Iced Popsicles                                    All solid food Carbonated beverages, regular and diet                                    Cranberry, grape and apple juices Sports drinks like Gatorade Lightly seasoned clear broth or consume(fat free) Sugar, honey syrup  Sample Menu Breakfast                                Lunch                                     Supper Cranberry juice                    Beef broth                            Chicken broth Jell-O                                     Grape juice                           Apple juice Coffee or tea                        Jell-O                                      Popsicle                                                Coffee or tea                        Coffee or tea  _____________________________________________________________________   WHAT IS A BLOOD TRANSFUSION? Blood Transfusion Information  A transfusion is the replacement of blood or some of its parts.  Blood is made up of multiple cells which provide different functions.  Red blood cells carry oxygen and are used for blood loss replacement.  White blood cells fight against  infection.  Platelets control bleeding.  Plasma helps clot blood.  Other blood products are available for specialized needs, such as hemophilia or other clotting disorders. BEFORE THE TRANSFUSION  Who gives blood for transfusions?   Healthy volunteers who are fully evaluated to make sure their blood is safe. This is blood bank blood. Transfusion therapy is the safest it has ever been in the practice of medicine. Before blood is taken from a donor, a complete history is taken to make sure that person has no history of diseases nor engages in risky social behavior (examples are intravenous drug use or sexual activity with multiple partners). The donor's travel history is screened to minimize risk of transmitting infections, such as malaria. The donated blood is tested for signs of infectious diseases, such as HIV and hepatitis. The blood is then tested to be sure it is compatible with you in order to minimize the chance of a transfusion reaction. If you or a relative donates blood, this is often done in anticipation of surgery and is not appropriate for emergency situations. It takes many days to process the donated blood. RISKS AND COMPLICATIONS Although transfusion therapy is very safe and saves many lives, the main dangers of transfusion include:   Getting an infectious disease.  Developing a transfusion reaction. This is an allergic reaction to something in the blood you were given. Every precaution is taken to prevent this. The decision to have a blood transfusion has been considered carefully by your caregiver before blood is given. Blood is not given unless the benefits outweigh the risks. AFTER THE TRANSFUSION  Right after receiving a blood transfusion, you will usually feel much better and more energetic. This is especially true if your red blood cells have gotten low (anemic). The transfusion raises the level of the red blood cells which carry oxygen, and this usually causes an energy  increase.  The nurse administering the transfusion will monitor you carefully for complications. HOME CARE INSTRUCTIONS  No special instructions are needed after a transfusion. You may find your energy is better. Speak with your caregiver about any limitations on activity for underlying diseases you may have. SEEK MEDICAL CARE IF:   Your condition is not improving after your transfusion.  You develop redness or irritation at the intravenous (IV) site. SEEK IMMEDIATE MEDICAL CARE IF:  Any of the following symptoms occur over the next 12 hours:  Shaking chills.  You have a temperature by mouth above 102 F (38.9 C), not controlled by medicine.  Chest, back, or muscle pain.  People around you feel you are not acting correctly or are confused.  Shortness of breath or difficulty breathing.  Dizziness and fainting.  You get a rash or develop hives.  You have a decrease in urine output.  Your urine turns a dark color or changes to pink, red, or brown. Any of the following symptoms occur over the next 10 days:  You have a temperature by mouth above 102 F (38.9 C), not controlled by medicine.  Shortness of breath.  Weakness after normal activity.  The white part of the eye turns yellow (jaundice).  You have a decrease in the amount of urine or are urinating less often.  Your urine turns a dark color or changes to pink, red, or brown. Document Released: 11/09/2000 Document  Revised: 02/04/2012 Document Reviewed: 06/28/2008 ExitCare Patient Information 2014 [place].  _______________________________________________________________________  Incentive Spirometer  An incentive spirometer is a tool that can help keep your lungs clear and active. This tool measures how well you are filling your lungs with each breath. Taking long deep breaths may help reverse or decrease the chance of developing breathing (pulmonary) problems (especially infection) following:  A long  period of time when you are unable to move or be active. BEFORE THE PROCEDURE   If the spirometer includes an indicator to show your best effort, your nurse or respiratory therapist will set it to a desired goal.  If possible, sit up straight or lean slightly forward. Try not to slouch.  Hold the incentive spirometer in an upright position. INSTRUCTIONS FOR USE  1. Sit on the edge of your bed if possible, or sit up as far as you can in bed or on a chair. 2. Hold the incentive spirometer in an upright position. 3. Breathe out normally. 4. Place the mouthpiece in your mouth and seal your lips tightly around it. 5. Breathe in slowly and as deeply as possible, raising the piston or the ball toward the top of the column. 6. Hold your breath for 3-5 seconds or for as long as possible. Allow the piston or ball to fall to the bottom of the column. 7. Remove the mouthpiece from your mouth and breathe out normally. 8. Rest for a few seconds and repeat Steps 1 through 7 at least 10 times every 1-2 hours when you are awake. Take your time and take a few normal breaths between deep breaths. 9. The spirometer may include an indicator to show your best effort. Use the indicator as a goal to work toward during each repetition. 10. After each set of 10 deep breaths, practice coughing to be sure your lungs are clear. If you have an incision (the cut made at the time of surgery), support your incision when coughing by placing a pillow or rolled up towels firmly against it. Once you are able to get out of bed, walk around indoors and cough well. You may stop using the incentive spirometer when instructed by your caregiver.  RISKS AND COMPLICATIONS  Take your time so you do not get dizzy or light-headed.  If you are in pain, you may need to take or ask for pain medication before doing incentive spirometry. It is harder to take a deep breath if you are having pain. AFTER USE  Rest and breathe slowly and  easily.  It can be helpful to keep track of a log of your progress. Your caregiver can provide you with a simple table to help with this. If you are using the spirometer at home, follow these instructions: [place] IF:   You are having difficultly using the spirometer.  You have trouble using the spirometer as often as instructed.  Your pain medication is not giving enough relief while using the spirometer.  You develop fever of 100.5 F (38.1 C) or higher. SEEK IMMEDIATE MEDICAL CARE IF:   You cough up bloody sputum that had not been present before.  You develop fever of 102 F (38.9 C) or greater.  You develop worsening pain at or near the incision site. MAKE SURE YOU:   Understand these instructions.  Will watch your condition.  Will get help right away if you are not doing well or get worse. Document Released: 03/25/2007 Document Revised: 02/04/2012 Document Reviewed: 05/26/2007 ExitCare Patient  Information 2014 [place], [place].   ________________________________________________________________________

## 2020-03-31 ENCOUNTER — Encounter (HOSPITAL_COMMUNITY)
Admission: RE | Admit: 2020-03-31 | Discharge: 2020-03-31 | Disposition: A | Discharge status: Home / Self Care | Payer: BC Managed Care – PPO | Source: Ambulatory Visit | Attending: Orthopaedic Surgery | Admitting: Orthopaedic Surgery

## 2020-03-31 ENCOUNTER — Other Ambulatory Visit: Payer: Self-pay

## 2020-03-31 ENCOUNTER — Encounter (HOSPITAL_COMMUNITY): Payer: Self-pay

## 2020-03-31 ENCOUNTER — Encounter: Payer: Self-pay | Admitting: Orthopaedic Surgery

## 2020-03-31 DIAGNOSIS — Z01818 Encounter for other preprocedural examination: Secondary | ICD-10-CM | POA: Diagnosis not present

## 2020-03-31 HISTORY — DX: Personal history of other diseases of the respiratory system: Z87.09

## 2020-03-31 LAB — BASIC METABOLIC PANEL
Anion gap: 10 (ref 5–15)
BUN: 19 mg/dL (ref 6–20)
CO2: 30 mmol/L (ref 22–32)
Calcium: 9.4 mg/dL (ref 8.9–10.3)
Chloride: 101 mmol/L (ref 98–111)
Creatinine, Ser: 0.56 mg/dL (ref 0.44–1.00)
GFR calc Af Amer: >60 mL/min (ref >60)
GFR calc non Af Amer: >60 mL/min (ref >60)
Glucose, Bld: 113 mg/dL — ABNORMAL HIGH (ref 70–99)
Potassium: 3.1 mmol/L — ABNORMAL LOW (ref 3.5–5.1)
Sodium: 141 mmol/L (ref 135–145)

## 2020-03-31 LAB — CBC
HCT: 42.8 % (ref 36.0–46.0)
Hemoglobin: 13.9 g/dL (ref 12.0–15.0)
MCH: 29 pg (ref 26.0–34.0)
MCHC: 32.5 g/dL (ref 30.0–36.0)
MCV: 89.2 fL (ref 80.0–100.0)
Platelets: 271 10*3/uL (ref 150–400)
RBC: 4.8 MIL/uL (ref 3.87–5.11)
RDW: 12.5 % (ref 11.5–15.5)
WBC: 5.6 10*3/uL (ref 4.0–10.5)
nRBC: 0 % (ref 0.0–0.2)

## 2020-03-31 LAB — SURGICAL PCR SCREEN
MRSA, PCR: NEGATIVE
Staphylococcus aureus: NEGATIVE

## 2020-03-31 NOTE — Progress Notes (Signed)
Patient verbalized understanding of instructions that were given to them at the PAT appointment. Patient was also instructed that they will need to review over the PAT instructions again at home before surgery.  

## 2020-03-31 NOTE — Progress Notes (Signed)
BMP results from 03/31/20 routed via epic to Dr Maureen Ralphs.

## 2020-04-01 LAB — ABO/RH: ABO/RH(D): B NEG

## 2020-04-04 NOTE — Progress Notes (Addendum)
Anesthesia Chart Review   Case: [id number] Date/Time: 04/08/20 0815   Procedure: RIGHT TOTAL KNEE ARTHROPLASTY (Right Knee)   Anesthesia type: Choice   Pre-op diagnosis: Right knee osteoarthritis   [facility] 10 / [facility]   Surgeons: Mcarthur Rossetti, MD      DISCUSSION:53 y.o. never smoker with h/o GERD, HTN, right knee OA scheduled for above procedure with Dr. Jean Rosenthal 04/08/2020.    Pt previously evaluated by cardiology 09/2018 for chest pain.  During admission negative troponins x3 . Coronary CTA 09/25/2018 with no significant CAD and calcium score of 0.  Echo 09/24/2018 with normal LFEF, no valve abnormalities.   Denies recurrent chest pain.    Last seen by PCP 1/06/202.  BP well controlled at this visit.    EKG 03/31/2020 interpretation with Long QTc, QT/QTc 570/641.  Reviewed with Dr. Ambrose Pancoast who compared most recent EKG to previous tracing 09/25/2018 and appreciated no significant change.  No further workup recommended.   VS: BP 133/72   Pulse 72   Temp 37 C (Oral)   Resp 16   Ht 5\' 8"  (1.727 m)   Wt 122 kg   SpO2 100%   BMI 40.90 kg/m   PROVIDERS: Thornton Dales I, MD is PCP    LABS: Labs reviewed: Acceptable for surgery. (all labs ordered are listed, but only abnormal results are displayed)  Labs Reviewed  BASIC METABOLIC PANEL - Abnormal; Notable for the following components:      Result Value   Potassium 3.1 (*)    Glucose, Bld 113 (*)    All other components within normal limits  SURGICAL PCR SCREEN  CBC  TYPE AND SCREEN     IMAGES:   EKG: 03/31/2020 Rate 76bpm Critical Test Result: Long QTc Normal sinus rhythm with sinus arrhythmia Nonspecific ST and T wave abnormality Abnormal ECG HR is faster since previous eCG  CV: CT Coronary 09/25/2018 IMPRESSION: 1. Coronary calcium score of 0. This was 0 percentile for age and sex matched control.  2. Normal coronary origin with right dominance.  3. Minimal CAD in the  proximal LAD, CADRADS = 1. Tortuosity of the LAD and likely RCA. Canont exclude mid-RCA or proximal PDA stenosis, however, this is more likely secondary to motion artifact given the patent distal vessel.  Echo 09/24/2018 Study Conclusions   - Left ventricle: The cavity size was normal. Systolic function was  normal. The estimated ejection fraction was in the range of 60%  to 65%. Wall motion was normal; there were no regional wall  motion abnormalities. Left ventricular diastolic function  parameters were normal.  - Left atrium: The atrium was mildly dilated.  Past Medical History:  Diagnosis Date  . Anginal pain ([place]) 9/12   states negative cardiology workup exception of murmur  . Anxiety and depression   . Bony sclerosis 08/22/2011  . GERD (gastroesophageal reflux disease)   . Heart murmur   . Hx of bronchitis   . Hypertension   . Lung nodule 08/24/2011    Past Surgical History:  Procedure Laterality Date  . ABDOMINAL HYSTERECTOMY  2007  . LAPAROSCOPIC GASTRIC BANDING  2011  . LAPAROSCOPIC GASTRIC SLEEVE RESECTION N/A 07/21/2013   Procedure: LAPAROSCOPIC GASTRIC SLEEVE RESECTION, LYSIS OF ADHESIONS, EGD;  Surgeon: Madilyn Hook, DO;  [facility];  Service: General;  Laterality: N/A;  . LAPAROSCOPIC LYSIS OF ADHESIONS  07/21/2013   Procedure: LAPAROSCOPIC LYSIS OF ADHESIONS;  Surgeon: Madilyn Hook, DO;  [facility];  Service:  General;;  . LAPAROSCOPIC REPAIR AND REMOVAL OF GASTRIC BAND N/A 05/05/2013   Procedure: LAPAROSCOPIC REMOVAL OF ADJUSTABLE GASTRIC BAND AND PORT, ENDOSCOPY ;  Surgeon: Lodema Pilot, DO;  [facility];  Service: General;  Laterality: N/A;  laparoscopic removal of adjustable gastric band AND PORT   . UPPER GI ENDOSCOPY  07/21/2013   Procedure: UPPER GI ENDOSCOPY;  Surgeon: Lodema Pilot, DO;  [facility];  Service: General;;    MEDICATIONS: . acetaminophen (TYLENOL) 650 MG CR tablet  . AMBULATORY NON FORMULARY MEDICATION  . aspirin  81 MG EC tablet  . cetirizine (ZYRTEC) 10 MG tablet  . chlorthalidone (HYGROTON) 25 MG tablet  . gabapentin (NEURONTIN) 100 MG capsule  . HYDROcodone-acetaminophen (NORCO/VICODIN) 5-325 MG tablet  . potassium chloride SA (K-DUR) 20 MEQ tablet   No current facility-administered medications for this encounter.   Janey Genta [facility] Pre-Surgical Testing [phone] 04/04/20  12:35 PM

## 2020-04-05 ENCOUNTER — Other Ambulatory Visit (HOSPITAL_COMMUNITY)
Admission: RE | Admit: 2020-04-05 | Discharge: 2020-04-05 | Disposition: A | Discharge status: Home / Self Care | Payer: BC Managed Care – PPO | Source: Ambulatory Visit | Attending: Orthopaedic Surgery | Admitting: Orthopaedic Surgery

## 2020-04-05 DIAGNOSIS — Z20822 Contact with and (suspected) exposure to covid-19: Secondary | ICD-10-CM | POA: Diagnosis not present

## 2020-04-05 DIAGNOSIS — Z01812 Encounter for preprocedural laboratory examination: Secondary | ICD-10-CM | POA: Insufficient documentation

## 2020-04-05 LAB — SARS CORONAVIRUS 2 (TAT 6-24 HRS): SARS Coronavirus 2: NEGATIVE

## 2020-04-07 MED ORDER — DEXTROSE 5 % IV SOLN
3.0000 g | INTRAVENOUS | Status: AC
  Administered 2020-04-08: 3 g via INTRAVENOUS
  Filled 2020-04-07: qty 3

## 2020-04-08 ENCOUNTER — Encounter (HOSPITAL_COMMUNITY)
Admission: RE | Disposition: A | Discharge status: Home Health | Payer: Self-pay | Source: Home / Self Care | Attending: Orthopaedic Surgery

## 2020-04-08 ENCOUNTER — Encounter (HOSPITAL_COMMUNITY): Payer: Self-pay | Admitting: Orthopaedic Surgery

## 2020-04-08 ENCOUNTER — Observation Stay (HOSPITAL_COMMUNITY): Payer: BC Managed Care – PPO

## 2020-04-08 ENCOUNTER — Ambulatory Visit (HOSPITAL_COMMUNITY): Payer: BC Managed Care – PPO | Admitting: Registered Nurse

## 2020-04-08 ENCOUNTER — Other Ambulatory Visit: Payer: Self-pay

## 2020-04-08 ENCOUNTER — Ambulatory Visit (HOSPITAL_COMMUNITY): Payer: BC Managed Care – PPO | Admitting: Physician Assistant

## 2020-04-08 ENCOUNTER — Inpatient Hospital Stay (HOSPITAL_COMMUNITY)
Admission: RE | Admit: 2020-04-08 | Discharge: 2020-04-10 | DRG: 470 | Disposition: A | Discharge status: Home Health | Payer: BC Managed Care – PPO | Attending: Orthopaedic Surgery | Admitting: Orthopaedic Surgery

## 2020-04-08 DIAGNOSIS — M1712 Unilateral primary osteoarthritis, left knee: Secondary | ICD-10-CM | POA: Diagnosis not present

## 2020-04-08 DIAGNOSIS — F419 Anxiety disorder, unspecified: Secondary | ICD-10-CM | POA: Diagnosis present

## 2020-04-08 DIAGNOSIS — Z91048 Other nonmedicinal substance allergy status: Secondary | ICD-10-CM | POA: Diagnosis not present

## 2020-04-08 DIAGNOSIS — K219 Gastro-esophageal reflux disease without esophagitis: Secondary | ICD-10-CM | POA: Diagnosis not present

## 2020-04-08 DIAGNOSIS — E785 Hyperlipidemia, unspecified: Secondary | ICD-10-CM | POA: Diagnosis present

## 2020-04-08 DIAGNOSIS — Z96651 Presence of right artificial knee joint: Secondary | ICD-10-CM

## 2020-04-08 DIAGNOSIS — Z6841 Body Mass Index (BMI) 40.0 and over, adult: Secondary | ICD-10-CM | POA: Diagnosis not present

## 2020-04-08 DIAGNOSIS — I10 Essential (primary) hypertension: Secondary | ICD-10-CM | POA: Diagnosis not present

## 2020-04-08 DIAGNOSIS — G8918 Other acute postprocedural pain: Secondary | ICD-10-CM | POA: Diagnosis not present

## 2020-04-08 DIAGNOSIS — J309 Allergic rhinitis, unspecified: Secondary | ICD-10-CM | POA: Diagnosis not present

## 2020-04-08 DIAGNOSIS — Z888 Allergy status to other drugs, medicaments and biological substances status: Secondary | ICD-10-CM

## 2020-04-08 DIAGNOSIS — M5416 Radiculopathy, lumbar region: Secondary | ICD-10-CM | POA: Diagnosis present

## 2020-04-08 DIAGNOSIS — Z9884 Bariatric surgery status: Secondary | ICD-10-CM

## 2020-04-08 DIAGNOSIS — R011 Cardiac murmur, unspecified: Secondary | ICD-10-CM | POA: Diagnosis present

## 2020-04-08 DIAGNOSIS — F329 Major depressive disorder, single episode, unspecified: Secondary | ICD-10-CM | POA: Diagnosis not present

## 2020-04-08 DIAGNOSIS — Z90710 Acquired absence of both cervix and uterus: Secondary | ICD-10-CM

## 2020-04-08 DIAGNOSIS — R911 Solitary pulmonary nodule: Secondary | ICD-10-CM | POA: Diagnosis not present

## 2020-04-08 DIAGNOSIS — Z8673 Personal history of transient ischemic attack (TIA), and cerebral infarction without residual deficits: Secondary | ICD-10-CM | POA: Diagnosis not present

## 2020-04-08 DIAGNOSIS — Z471 Aftercare following joint replacement surgery: Secondary | ICD-10-CM | POA: Diagnosis not present

## 2020-04-08 DIAGNOSIS — Z8249 Family history of ischemic heart disease and other diseases of the circulatory system: Secondary | ICD-10-CM | POA: Diagnosis not present

## 2020-04-08 DIAGNOSIS — M1711 Unilateral primary osteoarthritis, right knee: Secondary | ICD-10-CM | POA: Diagnosis not present

## 2020-04-08 DIAGNOSIS — G459 Transient cerebral ischemic attack, unspecified: Secondary | ICD-10-CM | POA: Diagnosis not present

## 2020-04-08 HISTORY — PX: TOTAL KNEE ARTHROPLASTY: SHX125

## 2020-04-08 LAB — TYPE AND SCREEN
ABO/RH(D): B NEG
Antibody Screen: NEGATIVE

## 2020-04-08 SURGERY — ARTHROPLASTY, KNEE, TOTAL
Anesthesia: Spinal | Site: Knee | Laterality: Right

## 2020-04-08 MED ORDER — POTASSIUM CHLORIDE CRYS ER 20 MEQ PO TBCR
20.0000 meq | EXTENDED_RELEASE_TABLET | ORAL | Status: DC
  Administered 2020-04-08: 20 meq via ORAL
  Filled 2020-04-08: qty 1

## 2020-04-08 MED ORDER — DIPHENHYDRAMINE HCL 12.5 MG/5ML PO ELIX: 12.5000 mg | ORAL_SOLUTION | ORAL | Status: DC | PRN

## 2020-04-08 MED ORDER — BUPIVACAINE-EPINEPHRINE 0.5% -1:200000 IJ SOLN
INTRAMUSCULAR | Status: DC | PRN
  Administered 2020-04-08: 30 mL

## 2020-04-08 MED ORDER — SODIUM CHLORIDE 0.9 % IV SOLN: INTRAVENOUS | Status: DC

## 2020-04-08 MED ORDER — CEFAZOLIN SODIUM-DEXTROSE 1-4 GM/50ML-% IV SOLN
1.0000 g | Freq: Four times a day (QID) | INTRAVENOUS | Status: AC
  Administered 2020-04-08 (×2): 1 g via INTRAVENOUS
  Filled 2020-04-08 (×2): qty 50

## 2020-04-08 MED ORDER — METHOCARBAMOL 500 MG PO TABS
500.0000 mg | ORAL_TABLET | Freq: Four times a day (QID) | ORAL | Status: DC | PRN
  Administered 2020-04-08 – 2020-04-10 (×6): 500 mg via ORAL
  Filled 2020-04-08 (×6): qty 1

## 2020-04-08 MED ORDER — DEXAMETHASONE SODIUM PHOSPHATE 4 MG/ML IJ SOLN
INTRAMUSCULAR | Status: DC | PRN
  Administered 2020-04-08: 8 mg via PERINEURAL

## 2020-04-08 MED ORDER — FENTANYL CITRATE (PF) 100 MCG/2ML IJ SOLN: 50.0000 ug | INTRAMUSCULAR | Status: DC

## 2020-04-08 MED ORDER — PANTOPRAZOLE SODIUM 40 MG PO TBEC
40.0000 mg | DELAYED_RELEASE_TABLET | Freq: Every day | ORAL | Status: DC
  Administered 2020-04-08 – 2020-04-10 (×3): 40 mg via ORAL
  Filled 2020-04-08 (×3): qty 1

## 2020-04-08 MED ORDER — LIDOCAINE 2% (20 MG/ML) 5 ML SYRINGE
INTRAMUSCULAR | Status: DC | PRN
  Administered 2020-04-08: 80 mg via INTRAVENOUS

## 2020-04-08 MED ORDER — DEXAMETHASONE SODIUM PHOSPHATE 10 MG/ML IJ SOLN
INTRAMUSCULAR | Status: AC
  Filled 2020-04-08: qty 1

## 2020-04-08 MED ORDER — EPHEDRINE SULFATE-NACL 50-0.9 MG/10ML-% IV SOSY
PREFILLED_SYRINGE | INTRAVENOUS | Status: DC | PRN
  Administered 2020-04-08: 5 mg via INTRAVENOUS

## 2020-04-08 MED ORDER — HYDROMORPHONE HCL 1 MG/ML IJ SOLN
INTRAMUSCULAR | Status: AC
  Filled 2020-04-08: qty 1

## 2020-04-08 MED ORDER — ONDANSETRON HCL 4 MG/2ML IJ SOLN
INTRAMUSCULAR | Status: AC
  Filled 2020-04-08: qty 2

## 2020-04-08 MED ORDER — BUPIVACAINE IN DEXTROSE 0.75-8.25 % IT SOLN
INTRATHECAL | Status: DC | PRN
  Administered 2020-04-08: 1.8 mL via INTRATHECAL

## 2020-04-08 MED ORDER — PROPOFOL 1000 MG/100ML IV EMUL
INTRAVENOUS | Status: AC
  Filled 2020-04-08: qty 100

## 2020-04-08 MED ORDER — GABAPENTIN 100 MG PO CAPS
100.0000 mg | ORAL_CAPSULE | Freq: Three times a day (TID) | ORAL | Status: DC
  Administered 2020-04-08 – 2020-04-10 (×6): 100 mg via ORAL
  Filled 2020-04-08 (×6): qty 1

## 2020-04-08 MED ORDER — DOCUSATE SODIUM 100 MG PO CAPS
100.0000 mg | ORAL_CAPSULE | Freq: Two times a day (BID) | ORAL | Status: DC
  Administered 2020-04-08 – 2020-04-10 (×4): 100 mg via ORAL
  Filled 2020-04-08 (×4): qty 1

## 2020-04-08 MED ORDER — ONDANSETRON HCL 4 MG/2ML IJ SOLN: 4.0000 mg | Freq: Four times a day (QID) | INTRAMUSCULAR | Status: DC | PRN

## 2020-04-08 MED ORDER — LACTATED RINGERS IV SOLN: INTRAVENOUS | Status: DC

## 2020-04-08 MED ORDER — SODIUM CHLORIDE 0.9 % IR SOLN
Status: DC | PRN
  Administered 2020-04-08: 1000 mL

## 2020-04-08 MED ORDER — PROPOFOL 10 MG/ML IV BOLUS
INTRAVENOUS | Status: DC | PRN
Start: 2020-04-08 — End: 2020-04-08
  Administered 2020-04-08: 30 mg via INTRAVENOUS

## 2020-04-08 MED ORDER — BUPIVACAINE-EPINEPHRINE (PF) 0.5% -1:200000 IJ SOLN
INTRAMUSCULAR | Status: DC | PRN
Start: 2020-04-08 — End: 2020-04-08
  Administered 2020-04-08: 30 mL

## 2020-04-08 MED ORDER — METOCLOPRAMIDE HCL 5 MG PO TABS: 5.0000 mg | ORAL_TABLET | Freq: Three times a day (TID) | ORAL | Status: DC | PRN

## 2020-04-08 MED ORDER — HYDROMORPHONE HCL 1 MG/ML IJ SOLN
0.5000 mg | INTRAMUSCULAR | Status: DC | PRN
  Administered 2020-04-08 – 2020-04-10 (×3): 1 mg via INTRAVENOUS
  Filled 2020-04-08 (×2): qty 1

## 2020-04-08 MED ORDER — METHOCARBAMOL 500 MG IVPB - SIMPLE MED
500.0000 mg | Freq: Four times a day (QID) | INTRAVENOUS | Status: DC | PRN
  Filled 2020-04-08: qty 50

## 2020-04-08 MED ORDER — ONDANSETRON HCL 4 MG/2ML IJ SOLN
INTRAMUSCULAR | Status: DC | PRN
  Administered 2020-04-08: 4 mg via INTRAVENOUS

## 2020-04-08 MED ORDER — CLONIDINE HCL (ANALGESIA) 100 MCG/ML EP SOLN
EPIDURAL | Status: DC | PRN
Start: 2020-04-08 — End: 2020-04-08
  Administered 2020-04-08: 80 ug

## 2020-04-08 MED ORDER — CHLORTHALIDONE 25 MG PO TABS
25.0000 mg | ORAL_TABLET | Freq: Every day | ORAL | Status: DC
  Administered 2020-04-09 – 2020-04-10 (×2): 25 mg via ORAL
  Filled 2020-04-08 (×2): qty 1

## 2020-04-08 MED ORDER — ALUM & MAG HYDROXIDE-SIMETH 200-200-20 MG/5ML PO SUSP
30.0000 mL | ORAL | Status: DC | PRN
  Administered 2020-04-09: 30 mL via ORAL
  Filled 2020-04-08 (×2): qty 30

## 2020-04-08 MED ORDER — ORAL CARE MOUTH RINSE
15.0000 mL | Freq: Once | OROMUCOSAL | Status: AC
  Administered 2020-04-08: 15 mL via OROMUCOSAL

## 2020-04-08 MED ORDER — PROPOFOL 500 MG/50ML IV EMUL
INTRAVENOUS | Status: DC | PRN
  Administered 2020-04-08: 20 mg via INTRAVENOUS
  Administered 2020-04-08: 40 ug/kg/min via INTRAVENOUS

## 2020-04-08 MED ORDER — 0.9 % SODIUM CHLORIDE (POUR BTL) OPTIME
TOPICAL | Status: DC | PRN
  Administered 2020-04-08: 1000 mL

## 2020-04-08 MED ORDER — MIDAZOLAM HCL 2 MG/2ML IJ SOLN: 1.0000 mg | INTRAMUSCULAR | Status: DC

## 2020-04-08 MED ORDER — OXYCODONE HCL 5 MG PO TABS
5.0000 mg | ORAL_TABLET | ORAL | Status: DC | PRN
  Administered 2020-04-08 (×2): 10 mg via ORAL
  Administered 2020-04-09: 5 mg via ORAL
  Administered 2020-04-09: 10 mg via ORAL
  Filled 2020-04-08: qty 2
  Filled 2020-04-08: qty 1
  Filled 2020-04-08 (×4): qty 2

## 2020-04-08 MED ORDER — MENTHOL 3 MG MT LOZG: 1.0000 | LOZENGE | OROMUCOSAL | Status: DC | PRN

## 2020-04-08 MED ORDER — STERILE WATER FOR IRRIGATION IR SOLN
Status: DC | PRN
  Administered 2020-04-08: 2000 mL

## 2020-04-08 MED ORDER — FENTANYL CITRATE (PF) 100 MCG/2ML IJ SOLN
INTRAMUSCULAR | Status: AC
  Filled 2020-04-08: qty 2

## 2020-04-08 MED ORDER — ACETAMINOPHEN 325 MG PO TABS
325.0000 mg | ORAL_TABLET | Freq: Four times a day (QID) | ORAL | Status: DC | PRN
  Administered 2020-04-09 – 2020-04-10 (×2): 650 mg via ORAL
  Filled 2020-04-08 (×2): qty 2

## 2020-04-08 MED ORDER — ONDANSETRON HCL 4 MG PO TABS: 4.0000 mg | ORAL_TABLET | Freq: Four times a day (QID) | ORAL | Status: DC | PRN

## 2020-04-08 MED ORDER — DEXAMETHASONE SODIUM PHOSPHATE 4 MG/ML IJ SOLN
INTRAMUSCULAR | Status: DC | PRN
Start: 2020-04-08 — End: 2020-04-08
  Administered 2020-04-08: 8 mg via INTRAVENOUS

## 2020-04-08 MED ORDER — OXYCODONE HCL 5 MG PO TABS
10.0000 mg | ORAL_TABLET | ORAL | Status: DC | PRN
  Administered 2020-04-08: 15 mg via ORAL
  Administered 2020-04-09: 10 mg via ORAL
  Administered 2020-04-09 (×2): 15 mg via ORAL
  Administered 2020-04-09: 10 mg via ORAL
  Administered 2020-04-09 – 2020-04-10 (×4): 15 mg via ORAL
  Filled 2020-04-08 (×7): qty 3

## 2020-04-08 MED ORDER — POVIDONE-IODINE 10 % EX SWAB
2.0000 "application " | Freq: Once | CUTANEOUS | Status: AC
  Administered 2020-04-08: 2 via TOPICAL

## 2020-04-08 MED ORDER — TRANEXAMIC ACID-NACL 1000-0.7 MG/100ML-% IV SOLN
1000.0000 mg | INTRAVENOUS | Status: AC
  Administered 2020-04-08: 1000 mg via INTRAVENOUS
  Filled 2020-04-08: qty 100

## 2020-04-08 MED ORDER — METOCLOPRAMIDE HCL 5 MG/ML IJ SOLN: 5.0000 mg | Freq: Three times a day (TID) | INTRAMUSCULAR | Status: DC | PRN

## 2020-04-08 MED ORDER — ASPIRIN 81 MG PO CHEW
81.0000 mg | CHEWABLE_TABLET | Freq: Two times a day (BID) | ORAL | Status: DC
  Administered 2020-04-08 – 2020-04-10 (×4): 81 mg via ORAL
  Filled 2020-04-08 (×4): qty 1

## 2020-04-08 MED ORDER — KETOROLAC TROMETHAMINE 15 MG/ML IJ SOLN
15.0000 mg | Freq: Four times a day (QID) | INTRAMUSCULAR | Status: AC
  Administered 2020-04-08 – 2020-04-09 (×4): 15 mg via INTRAVENOUS
  Filled 2020-04-08 (×4): qty 1

## 2020-04-08 MED ORDER — BUPIVACAINE-EPINEPHRINE 0.5% -1:200000 IJ SOLN
INTRAMUSCULAR | Status: AC
  Filled 2020-04-08: qty 1

## 2020-04-08 MED ORDER — PROPOFOL 10 MG/ML IV BOLUS
INTRAVENOUS | Status: AC
  Filled 2020-04-08: qty 20

## 2020-04-08 MED ORDER — EPHEDRINE 5 MG/ML INJ
INTRAVENOUS | Status: AC
  Filled 2020-04-08: qty 10

## 2020-04-08 MED ORDER — CHLORHEXIDINE GLUCONATE 0.12 % MT SOLN
15.0000 mL | Freq: Once | OROMUCOSAL | Status: AC
  Filled 2020-04-08: qty 15

## 2020-04-08 MED ORDER — PHENOL 1.4 % MT LIQD: 1.0000 | OROMUCOSAL | Status: DC | PRN

## 2020-04-08 MED ORDER — MIDAZOLAM HCL 2 MG/2ML IJ SOLN
INTRAMUSCULAR | Status: AC
  Administered 2020-04-08: 2 mg via INTRAVENOUS
  Filled 2020-04-08: qty 2

## 2020-04-08 MED ORDER — FENTANYL CITRATE (PF) 100 MCG/2ML IJ SOLN
INTRAMUSCULAR | Status: AC
  Administered 2020-04-08: 100 ug via INTRAVENOUS
  Filled 2020-04-08: qty 2

## 2020-04-08 SURGICAL SUPPLY — 54 items
BAG ZIPLOCK 12X15 (MISCELLANEOUS) IMPLANT
BENZOIN TINCTURE PRP APPL 2/3 (GAUZE/BANDAGES/DRESSINGS) IMPLANT
BLADE SAG 18X100X1.27 (BLADE) ×2 IMPLANT
BLADE SURG SZ10 CARB STEEL (BLADE) ×4 IMPLANT
BNDG ELASTIC 6X5.8 VLCR STR LF (GAUZE/BANDAGES/DRESSINGS) ×2 IMPLANT
BOWL SMART MIX CTS (DISPOSABLE) IMPLANT
CLSR STERI-STRIP ANTIMIC 1/2X4 (GAUZE/BANDAGES/DRESSINGS) ×4 IMPLANT
COVER SURGICAL LIGHT HANDLE (MISCELLANEOUS) ×2 IMPLANT
COVER WAND RF STERILE (DRAPES) ×2 IMPLANT
CUFF TOURN SGL QUICK 34 (TOURNIQUET CUFF) ×2
CUFF TRNQT CYL 34X4.125X (TOURNIQUET CUFF) ×1 IMPLANT
DECANTER SPIKE VIAL GLASS SM (MISCELLANEOUS) ×2 IMPLANT
DRAPE U-SHAPE 47X51 STRL (DRAPES) ×2 IMPLANT
DRSG PAD ABDOMINAL 8X10 ST (GAUZE/BANDAGES/DRESSINGS) ×4 IMPLANT
DURAPREP 26ML APPLICATOR (WOUND CARE) ×2 IMPLANT
ELECT REM PT RETURN 15FT ADLT (MISCELLANEOUS) ×2 IMPLANT
FEMORAL POSTERIOR SZ3 RT (Joint) ×1 IMPLANT
GAUZE SPONGE 4X4 12PLY STRL (GAUZE/BANDAGES/DRESSINGS) ×2 IMPLANT
GAUZE XEROFORM 1X8 LF (GAUZE/BANDAGES/DRESSINGS) IMPLANT
GLOVE BIO SURGEON STRL SZ7.5 (GLOVE) ×2 IMPLANT
GLOVE BIOGEL PI IND STRL 8 (GLOVE) ×2 IMPLANT
GLOVE BIOGEL PI INDICATOR 8 (GLOVE) ×2
GLOVE ECLIPSE 8.0 STRL XLNG CF (GLOVE) ×2 IMPLANT
GOWN STRL REUS W/TWL XL LVL3 (GOWN DISPOSABLE) ×4 IMPLANT
HANDPIECE INTERPULSE COAX TIP (DISPOSABLE) ×2
HOLDER FOLEY CATH W/STRAP (MISCELLANEOUS) ×2 IMPLANT
IMMOBILIZER KNEE 20 (SOFTGOODS) ×2
IMMOBILIZER KNEE 20 THIGH 36 (SOFTGOODS) ×1 IMPLANT
INSERT PS TRIATH X3 #3 10 (Insert) ×2 IMPLANT
KIT TURNOVER KIT A (KITS) IMPLANT
KNEE PATELLA ASYMMETRIC 9X29 (Knees) ×2 IMPLANT
KNEE TIBIAL COMPONENT SZ3 (Knees) ×2 IMPLANT
NS IRRIG 1000ML POUR BTL (IV SOLUTION) ×2 IMPLANT
PACK TOTAL KNEE CUSTOM (KITS) ×2 IMPLANT
PADDING CAST COTTON 6X4 STRL (CAST SUPPLIES) ×4 IMPLANT
PENCIL SMOKE EVACUATOR (MISCELLANEOUS) IMPLANT
PIN FLUTED HEDLESS FIX 3.5X1/8 (PIN) ×2 IMPLANT
POSTERIOR FEMORAL SZ3 RT (Joint) ×2 IMPLANT
PROTECTOR NERVE ULNAR (MISCELLANEOUS) ×2 IMPLANT
SET HNDPC FAN SPRY TIP SCT (DISPOSABLE) ×1 IMPLANT
SET PAD KNEE POSITIONER (MISCELLANEOUS) ×2 IMPLANT
STAPLER VISISTAT 35W (STAPLE) IMPLANT
STRIP CLOSURE SKIN 1/2X4 (GAUZE/BANDAGES/DRESSINGS) ×4 IMPLANT
SUT MNCRL AB 4-0 PS2 18 (SUTURE) ×2 IMPLANT
SUT VIC AB 0 CT1 27 (SUTURE) ×2
SUT VIC AB 0 CT1 27XBRD ANTBC (SUTURE) ×1 IMPLANT
SUT VIC AB 1 CT1 36 (SUTURE) ×4 IMPLANT
SUT VIC AB 2-0 CT1 27 (SUTURE) ×4
SUT VIC AB 2-0 CT1 TAPERPNT 27 (SUTURE) ×2 IMPLANT
TRAY FOLEY MTR SLVR 14FR STAT (SET/KITS/TRAYS/PACK) ×2 IMPLANT
TRAY FOLEY MTR SLVR 16FR STAT (SET/KITS/TRAYS/PACK) IMPLANT
WATER STERILE IRR 1000ML POUR (IV SOLUTION) ×2 IMPLANT
WRAP KNEE MAXI GEL POST OP (GAUZE/BANDAGES/DRESSINGS) ×2 IMPLANT
YANKAUER SUCT BULB TIP 10FT TU (MISCELLANEOUS) ×2 IMPLANT

## 2020-04-08 NOTE — TOC Transition Note (Signed)
Transition of Care [facility]) - CM/SW Discharge Note   Patient Details  Name: Debra Carpenter MRN: [id number] Date of Birth: [birth year]  [facility]) CM/SW Contact:  Lennart Pall, LCSW Phone Number: 04/08/2020, 2:08 PM   Clinical Narrative:   Met with pt and spouse this afternoon to review potential d/c needs.  Both hopeful for home tomorrow.  Pt already has a rolling walker and a higher commode seat at home.  Confirmed with them that [facility] to provide HHPT.  No further needs noted.    Final next level of care: [place] Barriers to Discharge: Continued Medical Work up   Patient Goals and CMS Choice Patient states their goals for this hospitalization and ongoing recovery are:: hope to be home tomorrow      Discharge Placement                       Discharge Plan and Services                DME Arranged: N/A DME Agency: NA       HH Arranged: PT [place] Agency: [facility]pre-arranged via MD office)        Social Determinants of Health (SDOH) Interventions     Readmission Risk Interventions No flowsheet data found.

## 2020-04-08 NOTE — Brief Op Note (Signed)
04/08/2020  9:47 AM  PATIENT:  Debra Carpenter  54 y.o. female  PRE-OPERATIVE DIAGNOSIS:  Right knee osteoarthritis  POST-OPERATIVE DIAGNOSIS:  Right knee osteoarthritis  PROCEDURE:  Procedure(s): RIGHT TOTAL KNEE ARTHROPLASTY (Right)  SURGEON:  Surgeon(s) and Role:    Kathryne Hitch, MD - Primary  PHYSICIAN ASSISTANT: Rexene Edison, PA-C  ANESTHESIA:   local, regional and spinal  EBL:  50 mL   COUNTS:  YES  TOURNIQUET:   Total Tourniquet Time Documented: Thigh (Right) - 36 minutes Total: Thigh (Right) - 36 minutes   DICTATION: .Other Dictation: Dictation Number [id number]  PLAN OF CARE: Admit for overnight observation  PATIENT DISPOSITION:  PACU - hemodynamically stable.   Delay start of Pharmacological VTE agent (>24hrs) due to surgical blood loss or risk of bleeding: no

## 2020-04-08 NOTE — Anesthesia Procedure Notes (Signed)
Anesthesia Regional Block: Adductor canal block   Pre-Anesthetic Checklist: ,, timeout performed, Correct Patient, Correct Site, Correct Laterality, Correct Procedure, Correct Position, site marked, Risks and benefits discussed,  Surgical consent,  Pre-op evaluation,  At surgeon's request and post-op pain management  Laterality: Right  Prep: chloraprep       Needles:  Injection technique: Single-shot  Needle Type: Stimiplex     Needle Length: 9cm  Needle Gauge: 21     Additional Needles:   Procedures:,,,, ultrasound used (permanent image in chart),,,,  Narrative:  Start time: 04/08/2020 8:03 AM End time: 04/08/2020 8:08 AM Injection made incrementally with aspirations every 5 mL.  Performed by: Personally  Anesthesiologist: Lewie Loron, MD  Additional Notes: BP cuff, EKG monitors applied. Sedation begun. Artery and nerve location verified with U/S and anesthetic injected incrementally, slowly, and after negative aspirations under direct u/s guidance. Good fascial /perineural spread. Tolerated well.

## 2020-04-08 NOTE — H&P (Signed)
TOTAL KNEE ADMISSION H&P  Patient is being admitted for right total knee arthroplasty.  Subjective:  Chief Complaint: right knee pain.  HPI: Debra Carpenter, 54 y.o. female, has a history of pain and functional disability in the right knee due to arthritis and has failed non-surgical conservative treatments for greater than 12 weeks to includeNSAID's and/or analgesics, corticosteriod injections, viscosupplementation injections, flexibility and strengthening excercises, use of assistive devices, weight reduction as appropriate and activity modification.  Onset of symptoms was gradual, starting 4 years ago with gradually worsening course since that time. The patient noted no past surgery on the right knee(s).  Patient currently rates pain in the right knee(s) at 10 out of 10 with activity. Patient has night pain, worsening of pain with activity and weight bearing, pain that interferes with activities of daily living, pain with passive range of motion, crepitus and joint swelling.  Patient has evidence of subchondral sclerosis, periarticular osteophytes and joint space narrowing by imaging studies. There is no active infection.  Patient Active Problem List   Diagnosis Date Noted  . Unilateral primary osteoarthritis, right knee 03/14/2020  . Lumbar radiculopathy, right 07/15/2019  . Hyperlipidemia 11/28/2018  . TIA (transient ischemic attack) 10/21/2018  . Essential hypertension 09/24/2018  . Chest pain at rest   . Degenerative joint disease of knee, right 05/08/2018  . Pain of left heel 02/23/2013  . Bacterial vaginosis 12/25/2011  . Allergic rhinitis 12/25/2011  . Lung nodule 08/24/2011  . Atypical chest pain 08/22/2011  . Depression 08/22/2011  . Preventative health care 08/22/2011   Past Medical History:  Diagnosis Date  . Anginal pain (HCC) 9/12   states negative cardiology workup exception of murmur  . Anxiety and depression   . Bony sclerosis 08/22/2011  . GERD (gastroesophageal reflux  disease)   . Heart murmur   . Hx of bronchitis   . Hypertension   . Lung nodule 08/24/2011    Past Surgical History:  Procedure Laterality Date  . ABDOMINAL HYSTERECTOMY  2007  . LAPAROSCOPIC GASTRIC BANDING  2011  . LAPAROSCOPIC GASTRIC SLEEVE RESECTION N/A 07/21/2013   Procedure: LAPAROSCOPIC GASTRIC SLEEVE RESECTION, LYSIS OF ADHESIONS, EGD;  Surgeon: Lodema Pilot, DO;  Location: [facility];  Service: General;  Laterality: N/A;  . LAPAROSCOPIC LYSIS OF ADHESIONS  07/21/2013   Procedure: LAPAROSCOPIC LYSIS OF ADHESIONS;  Surgeon: Lodema Pilot, DO;  Location: [facility];  Service: General;;  . LAPAROSCOPIC REPAIR AND REMOVAL OF GASTRIC BAND N/A 05/05/2013   Procedure: LAPAROSCOPIC REMOVAL OF ADJUSTABLE GASTRIC BAND AND PORT, ENDOSCOPY ;  Surgeon: Lodema Pilot, DO;  Location: [facility];  Service: General;  Laterality: N/A;  laparoscopic removal of adjustable gastric band AND PORT   . UPPER GI ENDOSCOPY  07/21/2013   Procedure: UPPER GI ENDOSCOPY;  Surgeon: Lodema Pilot, DO;  Location: [facility];  Service: General;;    Current Facility-Administered Medications  Medication Dose Route Frequency Provider Last Rate Last Admin  . ceFAZolin (ANCEF) 3 g in dextrose 5 % 50 mL IVPB  3 g Intravenous On Call to OR Raquel James, RPH      . chlorhexidine (PERIDEX) 0.12 % solution 15 mL  15 mL Mouth/Throat Once Val Eagle, MD       Or  . MEDLINE mouth rinse  15 mL Mouth Rinse Once Val Eagle, MD      . fentaNYL (SUBLIMAZE) 100 MCG/2ML injection           . fentaNYL (SUBLIMAZE) injection 50-100 mcg  50-100 mcg Intravenous  Jennelle Human, MD      . lactated ringers infusion   Intravenous Continuous Leilani Able, MD 100 mL/hr at 04/08/20 0644 New Bag at 04/08/20 0644  . midazolam (VERSED) 2 MG/2ML injection           . midazolam (VERSED) injection 1-2 mg  1-2 mg Intravenous Jennelle Human, MD      . povidone-iodine 10 % swab 2 application  2 application Topical Once Richardean Canal W, PA-C       . tranexamic acid (CYKLOKAPRON) IVPB 1,000 mg  1,000 mg Intravenous To OR Kirtland Bouchard, PA-C       Allergies  Allergen Reactions  . Ace Inhibitors Anaphylaxis    Angioedema   . Adhesive [Tape] Hives  . Amlodipine     LEE  . Spironolactone     Weight gain  . Statins     Myalgias with rosuvastatin 5mg  daily, pravastatin 10mg , and atorvastatin    Social History   Tobacco Use  . Smoking status: Never Smoker  . Smokeless tobacco: Never Used  Substance Use Topics  . Alcohol use: No    Family History  Problem Relation Age of Onset  . Hypertension Mother   . Hypertension Father   . Cancer Father        ? colon cancer  . Cancer Maternal Grandmother        ovarian cancer  . Hypertension Maternal Grandfather   . Hypertension Paternal Grandfather      Review of Systems  Musculoskeletal: Positive for joint swelling.  All other systems reviewed and are negative.   Objective:  Physical Exam  Constitutional: She is oriented to person, place, and time. She appears well-developed and well-nourished.  HENT:  Head: Normocephalic and atraumatic.  Eyes: Pupils are equal, round, and reactive to light. EOM are normal.  Cardiovascular: Normal rate.  Respiratory: Effort normal.  GI: Soft.  Musculoskeletal:     Cervical back: Normal range of motion and neck supple.     Right knee: Swelling and bony tenderness present. Decreased range of motion. Tenderness present over the medial joint line and lateral joint line. Abnormal alignment and abnormal meniscus.  Neurological: She is alert and oriented to person, place, and time.  Skin: Skin is warm and dry.  Psychiatric: She has a normal mood and affect.    Vital signs in last 24 hours: Temp:  [97.9 F (36.6 C)] 97.9 F (36.6 C) (05/14 0640) Pulse Rate:  [72] 72 (05/14 0640) Resp:  [14] 14 (05/14 0640) BP: (169)/(97) 169/97 (05/14 0640) SpO2:  [99 %] 99 % (05/14 0640) Weight:  [birth year]1992 kg] 122 kg (05/14  0653)  Labs:   Estimated body mass index is 40.9 kg/m as calculated from the following:   Height as of this encounter: 5\' 8"  (1.727 m).   Weight as of this encounter: 122 kg.   Imaging Review Plain radiographs demonstrate severe degenerative joint disease of the right knee(s). The overall alignment ismild valgus. The bone quality appears to be excellent for age and reported activity level.      Assessment/Plan:  End stage arthritis, right knee   The patient history, physical examination, clinical judgment of the provider and imaging studies are consistent with end stage degenerative joint disease of the right knee(s) and total knee arthroplasty is deemed medically necessary. The treatment options including medical management, injection therapy arthroscopy and arthroplasty were discussed at length. The risks and benefits of total knee arthroplasty were presented and reviewed.  The risks due to aseptic loosening, infection, stiffness, patella tracking problems, thromboembolic complications and other imponderables were discussed. The patient acknowledged the explanation, agreed to proceed with the plan and consent was signed. Patient is being admitted for inpatient treatment for surgery, pain control, PT, OT, prophylactic antibiotics, VTE prophylaxis, progressive ambulation and ADL's and discharge planning. The patient is planning to be discharged home with home health services

## 2020-04-08 NOTE — Progress Notes (Signed)
Assisted Dr. Germeroth with right, ultrasound guided, adductor canal block. Side rails up, monitors on throughout procedure. See vital signs in flow sheet. Tolerated Procedure well. 

## 2020-04-08 NOTE — Transfer of Care (Signed)
Immediate Anesthesia Transfer of Care Note  Patient: Debra Carpenter  Procedure(s) Performed: RIGHT TOTAL KNEE ARTHROPLASTY (Right Knee)  Patient Location: PACU  Anesthesia Type:MAC and Spinal  Level of Consciousness: awake, alert , oriented and patient cooperative  Airway & Oxygen Therapy: Patient Spontanous Breathing and Patient connected to face mask oxygen  Post-op Assessment: Report given to RN and Post -op Vital signs reviewed and stable  Post vital signs: Reviewed and stable  Last Vitals:  Vitals Value Taken Time  BP 123/68 04/08/20 1022  Temp    Pulse 73 04/08/20 1023  Resp 12 04/08/20 1023  SpO2 100 % 04/08/20 1023  Vitals shown include unvalidated device data.  Last Pain:  Vitals:   04/08/20 0817  TempSrc:   PainSc: 0-No pain      Patients Stated Pain Goal: 4 (09/47/09 6283)  Complications: No apparent anesthesia complications

## 2020-04-08 NOTE — Evaluation (Signed)
Physical Therapy Evaluation Patient Details Name: Debra Carpenter MRN: [id number] DOB: 22-Sep-1966 Today's Date: 04/08/2020   History of Present Illness  Patient is 54 y.o. female s/p Rt TKA on 04/08/20 with PMH significant for HTN, GERD, anxiety and depression, angina, lap gastric band repair.     Clinical Impression  Debra Carpenter is a 54 y.o. female POD 0 s/p Rt TKA. Patient reports independence with mobility with recent use of RW for pain relief. Patient is now limited by functional impairments (see PT problem list below) and requires min-mod assist for transfers and gait with RW. Patient was able to ambulate ~8 feet with RW and min assist; limited by pain. Patient instructed in exercise to facilitate ROM and circulation. Patient will benefit from continued skilled PT interventions to address impairments and progress towards PLOF. Acute PT will follow to progress mobility and stair training in preparation for safe discharge home.     Follow Up Recommendations Follow surgeon's recommendation for DC plan and follow-up therapies;Home health PT    Equipment Recommendations  3in1 (PT)    Recommendations for Other Services       Precautions / Restrictions Precautions Precautions: Fall Restrictions Weight Bearing Restrictions: No RLE Weight Bearing: Weight bearing as tolerated      Mobility  Bed Mobility Overal bed mobility: Needs Assistance Bed Mobility: Supine to Sit     Supine to sit: Min assist;HOB elevated     General bed mobility comments: cues for use of bed rail to pivot to EOB, assist for Rt LE mobility.  Transfers Overall transfer level: Needs assistance Equipment used: Rolling walker (2 wheeled) Transfers: Sit to/from Stand Sit to Stand: Mod assist;From elevated surface         General transfer comment: cues for hand placement/technique with RW. assist required for power up and to steady in standing.   Ambulation/Gait Ambulation/Gait assistance: Min assist Gait  Distance (Feet): 8 Feet Assistive device: Rolling walker (2 wheeled) Gait Pattern/deviations: Step-to pattern;Decreased stance time - right;Decreased weight shift to right;Antalgic Gait velocity: decreased   General Gait Details: cues for step pattern and proximity to RW, assist at Rt knee to facilitate extension in stance initially. Pt able to use UE's to reduce weight bearing and prevent buckling.   Stairs         Wheelchair Mobility    Modified Rankin (Stroke Patients Only)       Balance Overall balance assessment: Needs assistance Sitting-balance support: Feet supported Sitting balance-Leahy Scale: Good     Standing balance support: During functional activity;Bilateral upper extremity supported Standing balance-Leahy Scale: Poor                  Pertinent Vitals/Pain Pain Assessment: Faces Faces Pain Scale: Hurts even more Pain Location: Rt knee Pain Descriptors / Indicators: Aching;Discomfort;Grimacing;Guarding Pain Intervention(s): Limited activity within patient's tolerance;Monitored during session;Repositioned;Premedicated before session;Ice applied    Home Living Family/patient expects to be discharged to:: Private residence Living Arrangements: Spouse/significant other Available Help at Discharge: Family Type of Home: House Home Access: Stairs to enter Entrance Stairs-Rails: None Entrance Stairs-Number of Steps: 1 Home Layout: One level Home Equipment: Environmental consultant - 2 wheels;Cane - single point;Shower seat - built in;Hand held shower head      Prior Function Level of Independence: Independent with assistive device(s)         Comments: pt has been using RW since ~January due to pain. She works in [employer], technical sales.     Hand Dominance   Dominant Hand: Right  Extremity/Trunk Assessment   Upper Extremity Assessment Upper Extremity Assessment: Overall WFL for tasks assessed    Lower Extremity Assessment Lower Extremity Assessment: RLE deficits/detail RLE  Deficits / Details: pt able to complete SLR with no extensor lag. limited by pain. RLE Sensation: WNL RLE Coordination: WNL    Cervical / Trunk Assessment Cervical / Trunk Assessment: Normal  Communication   Communication: No difficulties  Cognition Arousal/Alertness: Awake/alert Behavior During Therapy: WFL for tasks assessed/performed Overall Cognitive Status: Within Functional Limits for tasks assessed             General Comments      Exercises Total Joint Exercises Ankle Circles/Pumps: AROM;Both;20 reps;Seated Quad Sets: AROM;Right;5 reps;Seated   Assessment/Plan    PT Assessment Patient needs continued PT services  PT Problem List Decreased strength;Decreased range of motion;Decreased activity tolerance;Decreased balance;Decreased mobility;Decreased knowledge of use of DME       PT Treatment Interventions DME instruction;Gait training;Stair training;Functional mobility training;Therapeutic activities;Therapeutic exercise;Balance training;Patient/family education    PT Goals (Current goals can be found in the Care Plan section)  Acute Rehab PT Goals Patient Stated Goal: to get back home and have less pain PT Goal Formulation: With patient/family Time For Goal Achievement: 04/15/20 Potential to Achieve Goals: Good    Frequency 7X/week    AM-PAC PT "6 Clicks" Mobility  Outcome Measure Help needed turning from your back to your side while in a flat bed without using bedrails?: A Little Help needed moving from lying on your back to sitting on the side of a flat bed without using bedrails?: A Little Help needed moving to and from a bed to a chair (including a wheelchair)?: A Lot Help needed standing up from a chair using your arms (e.g., wheelchair or bedside chair)?: A Lot Help needed to walk in hospital room?: A Little Help needed climbing 3-5 steps with a railing? : A Lot 6 Click Score: 15    End of Session Equipment Utilized During Treatment: Gait  belt Activity Tolerance: Patient tolerated treatment well;Patient limited by pain Patient left: in chair;with call bell/phone within reach;with chair alarm set;with family/visitor present Nurse Communication: Mobility status PT Visit Diagnosis: Muscle weakness (generalized) (M62.81);Difficulty in walking, not elsewhere classified (R26.2)    Time: 7062-3762 PT Time Calculation (min) (ACUTE ONLY): 17 min   Charges:   PT Evaluation $PT Eval Low Complexity: 1 Low         Wynn Maudlin, DPT Physical Therapist with [facility] [phone]  04/08/2020 5:19 PM

## 2020-04-08 NOTE — Anesthesia Preprocedure Evaluation (Signed)
Anesthesia Evaluation  Patient identified by MRN, date of birth, ID band Patient awake    Reviewed: Allergy & Precautions, H&P , NPO status , Patient's Chart, lab work & pertinent test results  Airway Mallampati: II  TM Distance: >3 FB Neck ROM: full    Dental no notable dental hx. (+) Teeth Intact, Dental Advisory Given   Pulmonary neg pulmonary ROS,    Pulmonary exam normal breath sounds clear to auscultation       Cardiovascular Exercise Tolerance: Good hypertension, Pt. on medications + angina Normal cardiovascular exam+ Valvular Problems/Murmurs  Rhythm:regular Rate:Normal  Has chest pain but negative cardiac w/u   Neuro/Psych PSYCHIATRIC DISORDERS Anxiety Depression TIA   GI/Hepatic Neg liver ROS, GERD  ,  Endo/Other  Morbid obesity  Renal/GU negative Renal ROS     Musculoskeletal  (+) Arthritis ,   Abdominal (+) + obese,   Peds  Hematology negative hematology ROS (+)   Anesthesia Other Findings   Reproductive/Obstetrics negative OB ROS                             Anesthesia Physical  Anesthesia Plan  ASA: III  Anesthesia Plan: Spinal   Post-op Pain Management:  Regional for Post-op pain   Induction: Intravenous  PONV Risk Score and Plan: 3 and Ondansetron, Propofol infusion, Treatment may vary due to age or medical condition and Midazolam  Airway Management Planned:   Additional Equipment:   Intra-op Plan:   Post-operative Plan:   Informed Consent: I have reviewed the patients History and Physical, chart, labs and discussed the procedure including the risks, benefits and alternatives for the proposed anesthesia with the patient or authorized representative who has indicated his/her understanding and acceptance.     Dental advisory given  Plan Discussed with: CRNA  Anesthesia Plan Comments:         Anesthesia Quick Evaluation

## 2020-04-08 NOTE — Progress Notes (Signed)
Orthopedic Tech Progress Note Patient Details:  Jacqulyne Gladue 08/12/1966 [id number]  CPM Right Knee CPM Right Knee: On Right Knee Flexion (Degrees): 90 Right Knee Extension (Degrees): 0  Post Interventions Patient Tolerated: Well Instructions Provided: Care of device Ortho Devices Type of Ortho Device: CPM padding Ortho Device/Splint Interventions: Ordered, Application, Adjustment   Post Interventions Patient Tolerated: Well Instructions Provided: Care of device   Ancil Linsey 04/08/2020, 10:40 AM

## 2020-04-08 NOTE — Anesthesia Procedure Notes (Signed)
Spinal  Start time: 04/08/2020 8:28 AM End time: 04/08/2020 8:32 AM Staffing Resident/CRNA: Armistead, Lacey A, CRNA Preanesthetic Checklist Completed: patient identified, IV checked, site marked, risks and benefits discussed, surgical consent, monitors and equipment checked, pre-op evaluation and timeout performed Spinal Block Patient position: sitting Prep: DuraPrep and site prepped and draped Patient monitoring: heart rate, continuous pulse ox and blood pressure Approach: midline Location: L3-4 Injection technique: single-shot Needle Needle type: Pencan  Needle gauge: 24 G Needle length: 10 cm Additional Notes Spinal kit expiration date checked and verified. Pt placed in sitting position for spinal placement. Sterile prep and drape of back. One attempt- clear CSF obtained. - heme. Pt tolerated procedure well.      

## 2020-04-08 NOTE — Progress Notes (Signed)
Orthopedic Tech Progress Note Patient Details:  Debra Carpenter [birth year] [id number] CPM removed at 1425 Patient ID: Debra Carpenter, female   DOB: 11-02-1966, 54 y.o.   MRN: [id number]   Ancil Linsey 04/08/2020, 2:25 PM

## 2020-04-08 NOTE — Op Note (Signed)
NAMELILIT, CINELLI MEDICAL RECORD IP:[id number] ACCOUNT [id number] DATE OF BIRTH:1966-07-30 FACILITY: [facility] LOCATION: [facility] PHYSICIAN:Janiylah Hannis Kerry Fort, MD  OPERATIVE REPORT  DATE OF PROCEDURE:  04/08/2020  PREOPERATIVE DIAGNOSIS:  Primary osteoarthritis and degenerative joint disease with valgus malalignment, right knee.  POSTOPERATIVE DIAGNOSIS:  Primary osteoarthritis and degenerative joint disease with valgus malalignment, right knee.  PROCEDURE:  Right total knee arthroplasty.  IMPLANTS:  Stryker Triathlon press-fit knee system with size 3 femur, size 3 tibial tray, 10 mm fixed bearing polyethylene insert, size 29 press-fit patellar button.  SURGEON:  Lind Guest. Ninfa Linden, MD  ASSISTANT:  Erskine Emery, PA-C  ANESTHESIA: 1.  Right lower extremity adductor canal block. 2.  Spinal. 3.  Local with 0.25% plain Marcaine.  ESTIMATED BLOOD LOSS:  Less than 100 mL.  ANTIBIOTICS:  Two grams IV Ancef.  COMPLICATIONS:  None.  INDICATIONS:  The patient is a very pleasant 54 year old female well known to me.  She unfortunately has end-stage arthritis involving her right knee with valgus malalignment as well.  Her pain is daily and it is detrimentally affecting her mobility, her  quality of life, and her activities of daily living to the point she does wish to proceed with a total knee arthroplasty.  She has also tried and failed all forms of conservative treatment.  We did talk to her about knee replacement surgery and  described the risk of acute blood loss anemia, nerve or vessel injury, fracture, infection, DVT, and implant failure.  We talked about our goals being decreased pain, improve mobility and overall improve quality of life.  DESCRIPTION OF PROCEDURE:  After informed consent was obtained and appropriate right knee was marked.  Anesthesia obtained an adductor canal block in the right lower extremity in the holding room.  She was then brought to the operating room,  sat up on  the operating table.  Spinal anesthesia was obtained.  She was then laid in supine position on the operating table.  Foley catheter was placed and a nonsterile tourniquet was placed around her upper right thigh.  I then assessed her knee again and her  extension is full but she definitely has distal gradient 5 degrees of valgus.  Her right thigh, knee, leg, ankle and foot were prepped and draped with DuraPrep and sterile drapes, including a sterile stockinette.  Time-out was called and she was  identified as correct patient, correct right knee.  We then used an Esmarch to wrap that leg and tourniquet was inflated to 300 mm of pressure.  I then made a direct midline incision over the patella and carried this proximally and distally, dissected  down the knee joint and carried out a medial parapatellar arthrotomy finding a moderate joint effusion and significant osteoarthritis throughout the knee.  We removed remnants of ACL, PCL and medial and lateral meniscus.  With the knee in a flexed  position, we used extramedullary cutting guide for making our proximal tibia cut.  We set this to take 9 mm off the high side, correcting for varus and valgus and neutral slope.  We made this cut without difficulty.  We then went to the femur and used  the intramedullary guide for the femur through the hole made in the notch.  We set this for a right knee at 5 degrees externally rotated.  We did this for 8 mm distal femoral cut.  We made this cut without difficulty and brought the knee back down to  full extension and with a 9 mm extension  block, it achieved full extension.  We then went back to the femur and put our femoral sizing guide based off the epicondylar axis and Whiteside line.  Based off of this, I chose a size 3 femur.  We then put a  4-in-1 cutting block for a size 3 femur, made our anterior and posterior cuts, followed by our chamfer cuts.  We then made our femoral box cut as well.  I did assess the  knee in a flexed position at 90 degrees, did feel like we had achieved our flexion  and extension gap stability.  We then went back to the tibia and chose a size 3 tibial tray for coverage setting our rotation off the tibial tubercle and the femur.  We chose a size 3 tibia based off of this and did our press-fit keel punch based on her  young age and her nice solid bone.  With a size 3 trial tibia we trialed a size 3 right femur and trialed a 9 mm fixed bearing insert.  I was pleased with range of motion and stability but did feel like we needed at least 1 more mm thickness.  We then  made our patellar cut and drilled 3 holes for size 29 patellar button.  We then removed all instrumentation from the knee and washed the knee with normal saline solution.  We then dried the knee real well and placed our Marcaine around the arthrotomy.   We then dried the knee again.  With the knee in a flexed position, placed our real Stryker press-fit tibial tray size 3 followed by real size 3 right femur.  We placed our 10 mm fixed bearing polyethylene insert and press-fit our patellar button.  We put  the knee through several cycles of range of motion, assessed stability with drawer sign and varus and valgus stressing, was pleased with stability.  We then let the tourniquet down.  Hemostasis obtained with electrocautery.  We closed the arthrotomy  with interrupted #1 Vicryl suture followed by 0 Vicryl to close deep tissue, 2-0 Vicryl used to close subcutaneous tissue and interrupted staples on the skin.  Xeroform, well-padded sterile dressing was applied.  She was taken to recovery room in stable  condition.  All final counts were correct.  There were no complications noted.  Of note, Rexene Edison, PA-C did assist during the entire case and his assistance was crucial for facilitating all aspects of this case.  CN/NUANCE  D:04/08/2020 T:04/08/2020 JOB:[id number]

## 2020-04-09 DIAGNOSIS — M5416 Radiculopathy, lumbar region: Secondary | ICD-10-CM | POA: Diagnosis present

## 2020-04-09 DIAGNOSIS — Z6841 Body Mass Index (BMI) 40.0 and over, adult: Secondary | ICD-10-CM | POA: Diagnosis not present

## 2020-04-09 DIAGNOSIS — Z8249 Family history of ischemic heart disease and other diseases of the circulatory system: Secondary | ICD-10-CM | POA: Diagnosis not present

## 2020-04-09 DIAGNOSIS — Z91048 Other nonmedicinal substance allergy status: Secondary | ICD-10-CM | POA: Diagnosis not present

## 2020-04-09 DIAGNOSIS — K219 Gastro-esophageal reflux disease without esophagitis: Secondary | ICD-10-CM | POA: Diagnosis present

## 2020-04-09 DIAGNOSIS — F329 Major depressive disorder, single episode, unspecified: Secondary | ICD-10-CM | POA: Diagnosis present

## 2020-04-09 DIAGNOSIS — R911 Solitary pulmonary nodule: Secondary | ICD-10-CM | POA: Diagnosis present

## 2020-04-09 DIAGNOSIS — Z90710 Acquired absence of both cervix and uterus: Secondary | ICD-10-CM | POA: Diagnosis not present

## 2020-04-09 DIAGNOSIS — R011 Cardiac murmur, unspecified: Secondary | ICD-10-CM | POA: Diagnosis present

## 2020-04-09 DIAGNOSIS — M1711 Unilateral primary osteoarthritis, right knee: Secondary | ICD-10-CM | POA: Diagnosis present

## 2020-04-09 DIAGNOSIS — F419 Anxiety disorder, unspecified: Secondary | ICD-10-CM | POA: Diagnosis present

## 2020-04-09 DIAGNOSIS — Z8673 Personal history of transient ischemic attack (TIA), and cerebral infarction without residual deficits: Secondary | ICD-10-CM | POA: Diagnosis not present

## 2020-04-09 DIAGNOSIS — I10 Essential (primary) hypertension: Secondary | ICD-10-CM | POA: Diagnosis present

## 2020-04-09 DIAGNOSIS — E785 Hyperlipidemia, unspecified: Secondary | ICD-10-CM | POA: Diagnosis present

## 2020-04-09 DIAGNOSIS — Z888 Allergy status to other drugs, medicaments and biological substances status: Secondary | ICD-10-CM | POA: Diagnosis not present

## 2020-04-09 DIAGNOSIS — J309 Allergic rhinitis, unspecified: Secondary | ICD-10-CM | POA: Diagnosis present

## 2020-04-09 DIAGNOSIS — Z9884 Bariatric surgery status: Secondary | ICD-10-CM | POA: Diagnosis not present

## 2020-04-09 LAB — CBC
HCT: 34.7 % — ABNORMAL LOW (ref 36.0–46.0)
Hemoglobin: 11.2 g/dL — ABNORMAL LOW (ref 12.0–15.0)
MCH: 28.6 pg (ref 26.0–34.0)
MCHC: 32.3 g/dL (ref 30.0–36.0)
MCV: 88.7 fL (ref 80.0–100.0)
Platelets: 243 10*3/uL (ref 150–400)
RBC: 3.91 MIL/uL (ref 3.87–5.11)
RDW: 12.6 % (ref 11.5–15.5)
WBC: 12.1 10*3/uL — ABNORMAL HIGH (ref 4.0–10.5)
nRBC: 0 % (ref 0.0–0.2)

## 2020-04-09 LAB — BASIC METABOLIC PANEL
Anion gap: 6 (ref 5–15)
BUN: 12 mg/dL (ref 6–20)
CO2: 25 mmol/L (ref 22–32)
Calcium: 7.7 mg/dL — ABNORMAL LOW (ref 8.9–10.3)
Chloride: 104 mmol/L (ref 98–111)
Creatinine, Ser: 0.47 mg/dL (ref 0.44–1.00)
GFR calc Af Amer: >60 mL/min (ref >60)
GFR calc non Af Amer: >60 mL/min (ref >60)
Glucose, Bld: 135 mg/dL — ABNORMAL HIGH (ref 70–99)
Potassium: 3.1 mmol/L — ABNORMAL LOW (ref 3.5–5.1)
Sodium: 135 mmol/L (ref 135–145)

## 2020-04-09 NOTE — Plan of Care (Signed)
  Problem: Clinical Measurements: Goal: Ability to maintain clinical measurements within normal limits will improve Outcome: Progressing   Problem: Clinical Measurements: Goal: Will remain free from infection Outcome: Progressing   Problem: Clinical Measurements: Goal: Diagnostic test results will improve Outcome: Progressing   Problem: Clinical Measurements: Goal: Respiratory complications will improve Outcome: Progressing   Problem: Clinical Measurements: Goal: Cardiovascular complication will be avoided Outcome: Progressing   Problem: Nutrition: Goal: Adequate nutrition will be maintained Outcome: Progressing   Problem: Elimination: Goal: Will not experience complications related to urinary retention Outcome: Progressing

## 2020-04-09 NOTE — Progress Notes (Signed)
Subjective: 1 Day Post-Op Procedure(s) (LRB): RIGHT TOTAL KNEE ARTHROPLASTY (Right) Patient reports pain as moderate.    Objective: Vital signs in last 24 hours: Temp:  [97.6 F (36.4 C)-98.3 F (36.8 C)] 98.1 F (36.7 C) (05/15 0505) Pulse Rate:  [53-69] 54 (05/15 0505) Resp:  [13-19] 16 (05/15 0505) BP: (112-155)/(67-89) 112/67 (05/15 0505) SpO2:  [98 %-100 %] 98 % (05/15 0505)  Intake/Output from previous day: 05/14 0701 - 05/15 0700 In: 3436.9 [P.O.:420; I.V.:2766.9; IV Piggyback:250] Out: 1295 [Urine:1245; Blood:50] Intake/Output this shift: Total I/O In: 778.9 [P.O.:360; I.V.:418.9] Out: 0   Recent Labs    04/09/20 0311  HGB 11.2*   Recent Labs    04/09/20 0311  WBC 12.1*  RBC 3.91  HCT 34.7*  PLT 243   Recent Labs    04/09/20 0311  NA 135  K 3.1*  CL 104  CO2 25  BUN 12  CREATININE 0.47  GLUCOSE 135*  CALCIUM 7.7*   No results for input(s): LABPT, INR in the last 72 hours.  Sensation intact distally Intact pulses distally Dorsiflexion/Plantar flexion intact Incision: moderate drainage No cellulitis present Compartment soft   Assessment/Plan: 1 Day Post-Op Procedure(s) (LRB): RIGHT TOTAL KNEE ARTHROPLASTY (Right) Up with therapy Plan for discharge tomorrow Discharge home with home health  Will need to keep the patient here today so I can look at her knee again tomorrow due to the bloody drainage at the inferior aspect of her incision.  Should do well, but better to stay here today.    Kathryne Hitch 04/09/2020, 8:57 AM

## 2020-04-09 NOTE — Progress Notes (Signed)
Physical Therapy Treatment Patient Details Name: Debra Carpenter MRN: [id number] DOB: [birth year] Today's Date: 04/09/2020    History of Present Illness Patient is 54 y.o. female s/p Rt TKA on 04/08/20 with PMH significant for HTN, GERD, anxiety and depression, angina, lap gastric band repair.     PT Comments    Pt reports some improvement in pain, otherwise, similar ROM and strength compared to morning treatment.  Continue to advance PT as able.    Follow Up Recommendations  Follow surgeon's recommendation for DC plan and follow-up therapies;Home health PT     Equipment Recommendations  3in1 (PT)    Recommendations for Other Services       Precautions / Restrictions Precautions Precautions: Fall Restrictions RLE Weight Bearing: Weight bearing as tolerated    Mobility  Bed Mobility Overal bed mobility: Needs Assistance Bed Mobility: Supine to Sit;Sit to Supine     Supine to sit: Min assist;HOB elevated Sit to supine: Min assist;HOB elevated   General bed mobility comments: min a for R LE mobility; educated family how to assist or how to use leg lifter to assist  Transfers Overall transfer level: Needs assistance Equipment used: Rolling walker (2 wheeled) Transfers: Sit to/from Stand Sit to Stand: From elevated surface;Min assist         General transfer comment: cues for hand placement; pt used momentum and assist to stand  Ambulation/Gait Ambulation/Gait assistance: Min guard Gait Distance (Feet): 60 Feet Assistive device: Rolling walker (2 wheeled) Gait Pattern/deviations: Step-to pattern;Decreased stance time - right;Decreased weight shift to right;Antalgic Gait velocity: decreased   General Gait Details: Cues for sequencing with RW and RW proximity; step to pattern for pain control; no buckling today   Stairs             Wheelchair Mobility    Modified Rankin (Stroke Patients Only)       Balance Overall balance assessment: Needs  assistance Sitting-balance support: Feet supported Sitting balance-Leahy Scale: Good     Standing balance support: During functional activity;Single extremity supported Standing balance-Leahy Scale: Fair                              Cognition Arousal/Alertness: Awake/alert Behavior During Therapy: WFL for tasks assessed/performed Overall Cognitive Status: Within Functional Limits for tasks assessed                                        Exercises Total Joint Exercises Ankle Circles/Pumps: AROM;Both;20 reps;Seated Quad Sets: AROM;Both;10 reps;Supine Towel Squeeze: AROM;Both;10 reps;Supine Heel Slides: AAROM;Right;10 reps;Supine Hip ABduction/ADduction: AAROM;Right;10 reps;Supine Long Arc Quad: AAROM;Right;15 reps;Seated Knee Flexion: AAROM;Right;15 reps;Seated Goniometric ROM: R knee 0 to 70 degrees    General Comments General comments (skin integrity, edema, etc.): Pt educated on car transfer techniques.  Held stairs today due to pain.      Pertinent Vitals/Pain Pain Assessment: 0-10 Pain Score: 4  Pain Location: Rt knee Pain Descriptors / Indicators: Aching;Discomfort;Grimacing;Guarding Pain Intervention(s): Limited activity within patient's tolerance;Monitored during session;Premedicated before session;Repositioned;Relaxation    Home Living                      Prior Function            PT Goals (current goals can now be found in the care plan section) Acute Rehab PT Goals Patient Stated Goal: to get  back home and have less pain PT Goal Formulation: With patient/family Time For Goal Achievement: 04/15/20 Potential to Achieve Goals: Good Progress towards PT goals: Progressing toward goals    Frequency    7X/week      PT Plan Current plan remains appropriate    Co-evaluation              AM-PAC PT "6 Clicks" Mobility   Outcome Measure  Help needed turning from your back to your side while in a flat bed  without using bedrails?: A Little Help needed moving from lying on your back to sitting on the side of a flat bed without using bedrails?: A Little Help needed moving to and from a bed to a chair (including a wheelchair)?: A Little Help needed standing up from a chair using your arms (e.g., wheelchair or bedside chair)?: A Little Help needed to walk in hospital room?: A Little Help needed climbing 3-5 steps with a railing? : A Lot 6 Click Score: 17    End of Session Equipment Utilized During Treatment: Gait belt Activity Tolerance: Patient limited by pain Patient left: with call bell/phone within reach;with family/visitor present;in bed;with bed alarm set Nurse Communication: Mobility status PT Visit Diagnosis: Muscle weakness (generalized) (M62.81);Difficulty in walking, not elsewhere classified (R26.2)     Time: 1410-1440 PT Time Calculation (min) (ACUTE ONLY): 30 min  Charges:  $Gait Training: 8-22 mins $Therapeutic Exercise: 8-22 mins                     Royetta Asal, PT Acute Rehab Services Pager [phone] [facility] [phone] [facility] [phone]    Rayetta Humphrey 04/09/2020, 2:44 PM

## 2020-04-09 NOTE — Progress Notes (Signed)
Physical Therapy Treatment Patient Details Name: Debra Carpenter MRN: [id number] DOB: May 22, 1966 Today's Date: 04/09/2020    History of Present Illness Patient is 54 y.o. female s/p Rt TKA on 04/08/20 with PMH significant for HTN, GERD, anxiety and depression, angina, lap gastric band repair.     PT Comments    Pt is s/p R TKA on POD #1.  Pt limited by pain and edema.  She is requiring min A transfers and ambulated 60' with antalgic pattern.  Pt with fair ROM and quad activation at this time.  Pt is motivated and working with therapy as able. She required cues for exercise technique with modifications in amount of assist, support, and limited motion due to pain.  Will cont to benefit from PT.     Follow Up Recommendations  Follow surgeon's recommendation for DC plan and follow-up therapies;Home health PT     Equipment Recommendations  3in1 (PT)    Recommendations for Other Services       Precautions / Restrictions Precautions Precautions: Fall Restrictions Weight Bearing Restrictions: No RLE Weight Bearing: Weight bearing as tolerated    Mobility  Bed Mobility Overal bed mobility: Needs Assistance Bed Mobility: Supine to Sit;Sit to Supine     Supine to sit: Min assist;HOB elevated Sit to supine: Min assist;HOB elevated   General bed mobility comments: min a for R LE mobility; educated family how to assist or how to use leg lifter to assist  Transfers Overall transfer level: Needs assistance Equipment used: Rolling walker (2 wheeled) Transfers: Sit to/from Stand Sit to Stand: From elevated surface;Min assist         General transfer comment: cues for hand placement; pt used momentum and assist to stand  Ambulation/Gait Ambulation/Gait assistance: Min guard Gait Distance (Feet): 60 Feet Assistive device: Rolling walker (2 wheeled) Gait Pattern/deviations: Step-to pattern;Decreased stance time - right;Decreased weight shift to right;Antalgic Gait velocity: decreased    General Gait Details: Cues for sequencing with RW and RW proximity; step to pattern for pain control; no buckling today   Stairs             Wheelchair Mobility    Modified Rankin (Stroke Patients Only)       Balance Overall balance assessment: Needs assistance Sitting-balance support: Feet supported Sitting balance-Leahy Scale: Good     Standing balance support: During functional activity;Bilateral upper extremity supported Standing balance-Leahy Scale: Fair                              Cognition Arousal/Alertness: Awake/alert Behavior During Therapy: WFL for tasks assessed/performed Overall Cognitive Status: Within Functional Limits for tasks assessed                                        Exercises Total Joint Exercises Ankle Circles/Pumps: AROM;Both;20 reps;Seated Quad Sets: AROM;Both;10 reps;Supine Towel Squeeze: AROM;Both;10 reps;Supine Heel Slides: AAROM;Right;10 reps;Supine Hip ABduction/ADduction: AAROM;Right;10 reps;Supine Long Arc Quad: AAROM;Right;15 reps;Seated Knee Flexion: AAROM;Right;15 reps;Seated Goniometric ROM: R knee 0 to 70 degrees ROM    General Comments General comments (skin integrity, edema, etc.): Pt educated on safe ice use, no pivots, resting with leg straight, and HEP. Noted edema throughout R LE with increased area edema medial knee. Pt reports concern of blood clot. No warmth or erythma noted. No pain in calf and no pain with dorsiflexion. Did notify RN  of edema.  Placeed ice packs.  Pt reports ice has made leg ache. Educated to try 20 mins at a time , especially after exercise to assist with edema.      Pertinent Vitals/Pain Pain Assessment: 0-10 Pain Score: 6  Pain Location: Rt knee Pain Descriptors / Indicators: Aching;Discomfort;Grimacing;Guarding Pain Intervention(s): Limited activity within patient's tolerance;Monitored during session;Premedicated before session;Repositioned;Ice  applied;Relaxation;Patient requesting pain meds-RN notified    Home Living                      Prior Function            PT Goals (current goals can now be found in the care plan section) Acute Rehab PT Goals Patient Stated Goal: to get back home and have less pain PT Goal Formulation: With patient/family Time For Goal Achievement: 04/15/20 Potential to Achieve Goals: Good Progress towards PT goals: Progressing toward goals    Frequency    7X/week      PT Plan Current plan remains appropriate    Co-evaluation              AM-PAC PT "6 Clicks" Mobility   Outcome Measure  Help needed turning from your back to your side while in a flat bed without using bedrails?: A Little Help needed moving from lying on your back to sitting on the side of a flat bed without using bedrails?: A Little Help needed moving to and from a bed to a chair (including a wheelchair)?: A Little Help needed standing up from a chair using your arms (e.g., wheelchair or bedside chair)?: A Little Help needed to walk in hospital room?: A Little Help needed climbing 3-5 steps with a railing? : A Lot 6 Click Score: 17    End of Session Equipment Utilized During Treatment: Gait belt Activity Tolerance: Patient limited by pain Patient left: with call bell/phone within reach;with family/visitor present;in bed;with bed alarm set Nurse Communication: Mobility status PT Visit Diagnosis: Muscle weakness (generalized) (M62.81);Difficulty in walking, not elsewhere classified (R26.2)     Time: 9629-5284 PT Time Calculation (min) (ACUTE ONLY): 34 min  Charges:  $Gait Training: 8-22 mins $Therapeutic Exercise: 8-22 mins                     Maggie Font, PT Acute Rehab Services Pager [phone] [facility] 04/09/2020, 11:11 AM

## 2020-04-09 NOTE — Progress Notes (Signed)
Orthopedic Tech Progress Note Patient Details:  Debra Carpenter 11/05/1966 [id number]  CPM Right Knee CPM Right Knee: Off Right Knee Flexion (Degrees): 90 Right Knee Extension (Degrees): 0  Post Interventions Patient Tolerated: Well Instructions Provided: Care of device  Saul Fordyce 04/09/2020, 6:54 PM

## 2020-04-09 NOTE — Progress Notes (Signed)
Orthopedic Tech Progress Note Patient Details:  Debra Carpenter November 20, 1966 [id number]  CPM Right Knee CPM Right Knee: On Right Knee Flexion (Degrees): 90 Right Knee Extension (Degrees): 0  Post Interventions Patient Tolerated: Well Instructions Provided: Care of device  Saul Fordyce 04/09/2020, 4:53 PM

## 2020-04-10 MED ORDER — ASPIRIN 81 MG PO CHEW: 81.0000 mg | CHEWABLE_TABLET | Freq: Two times a day (BID) | ORAL | 0 refills | Status: DC

## 2020-04-10 MED ORDER — METHOCARBAMOL 500 MG PO TABS: 500.0000 mg | ORAL_TABLET | Freq: Four times a day (QID) | ORAL | 1 refills | Status: DC | PRN

## 2020-04-10 MED ORDER — OXYCODONE HCL 5 MG PO TABS: 5.0000 mg | ORAL_TABLET | ORAL | 0 refills | Status: DC | PRN

## 2020-04-10 NOTE — Progress Notes (Signed)
Patient ID: Debra Carpenter, female   DOB: [birth year], 54 y.o.   MRN: [id number] Doing better today.  Still in pain, but right knee stable and calf soft.  Can be discharged to home this afternoon.

## 2020-04-10 NOTE — Plan of Care (Signed)
Pt stable at this time with no needs. Pt to d/c home. Family at bedside.

## 2020-04-10 NOTE — Progress Notes (Signed)
Physical Therapy Treatment Patient Details Name: Debra Carpenter MRN: [id number] DOB: 1966/02/12 Today's Date: 04/10/2020    History of Present Illness Patient is 54 y.o. female s/p Rt TKA on 04/08/20 with PMH significant for HTN, GERD, anxiety and depression, angina, lap gastric band repair.     PT Comments    Pt is making steady progress with mobility. Pt demonstrated good understanding of HEP. Pt's daughter was present during session and educated on HEP. Pt is min guard assist to ambulate in the hall 127ft with RW. Pt reported she would like to d/c home today. Pt c/o some mild lightheadedness after ambulation; therefore, we deferred step training until second session. Feel pt will be safe to d/c home this afternoon after therapy.   Follow Up Recommendations  Follow surgeon's recommendation for DC plan and follow-up therapies;Home health PT     Equipment Recommendations  3in1 (PT)    Recommendations for Other Services       Precautions / Restrictions Precautions Precautions: Fall Restrictions Weight Bearing Restrictions: No RLE Weight Bearing: Weight bearing as tolerated    Mobility  Bed Mobility Overal bed mobility: Needs Assistance Bed Mobility: Supine to Sit     Supine to sit: HOB elevated;Min assist     General bed mobility comments: discussed leg assist using ofther foot, min assist with positioning R leg onto the bed  Transfers Overall transfer level: Needs assistance Equipment used: Rolling walker (2 wheeled) Transfers: Sit to/from Stand Sit to Stand: From elevated surface;Min guard            Ambulation/Gait Ambulation/Gait assistance: Min guard Gait Distance (Feet): 120 Feet Assistive device: Rolling walker (2 wheeled) Gait Pattern/deviations: Step-to pattern;Decreased stance time - right;Decreased weight shift to right;Antalgic Gait velocity: decreased       Stairs Stairs: (discussed, pt feeling lightheaded so deferred until next session)            Wheelchair Mobility    Modified Rankin (Stroke Patients Only)       Balance Overall balance assessment: Needs assistance Sitting-balance support: Feet supported Sitting balance-Leahy Scale: Good     Standing balance support: During functional activity;Single extremity supported Standing balance-Leahy Scale: Fair                              Cognition Arousal/Alertness: Awake/alert Behavior During Therapy: WFL for tasks assessed/performed Overall Cognitive Status: Within Functional Limits for tasks assessed                                        Exercises Total Joint Exercises Ankle Circles/Pumps: AROM;Strengthening;Both;10 reps;Supine Quad Sets: AROM;Both;10 reps;Supine Heel Slides: AAROM;Right;10 reps;Supine Hip ABduction/ADduction: AAROM;Right;10 reps;Supine Straight Leg Raises: AAROM;Strengthening;Right;10 reps;Supine Long Arc Quad: AAROM;Right;15 reps;Seated Knee Flexion: AAROM;Right;15 reps;Seated Goniometric ROM: 0-80*    General Comments General comments (skin integrity, edema, etc.): held stairs this am due to lightheaded      Pertinent Vitals/Pain Pain Assessment: 0-10 Pain Score: 5  Pain Location: Rt knee Pain Descriptors / Indicators: Aching;Discomfort Pain Intervention(s): Limited activity within patient's tolerance;Monitored during session;Repositioned;Premedicated before session    Home Living                      Prior Function            PT Goals (current goals can now be found in the care  plan section) Progress towards PT goals: Progressing toward goals    Frequency    7X/week      PT Plan Current plan remains appropriate    Co-evaluation              AM-PAC PT "6 Clicks" Mobility   Outcome Measure  Help needed turning from your back to your side while in a flat bed without using bedrails?: A Little Help needed moving from lying on your back to sitting on the side of a flat bed  without using bedrails?: A Little Help needed moving to and from a bed to a chair (including a wheelchair)?: A Little Help needed standing up from a chair using your arms (e.g., wheelchair or bedside chair)?: A Little Help needed to walk in hospital room?: A Little Help needed climbing 3-5 steps with a railing? : A Little 6 Click Score: 18    End of Session Equipment Utilized During Treatment: Gait belt Activity Tolerance: Patient limited by pain Patient left: with call bell/phone within reach;with family/visitor present;in bed Nurse Communication: Mobility status PT Visit Diagnosis: Muscle weakness (generalized) (M62.81);Difficulty in walking, not elsewhere classified (R26.2)     Time: 0092-3300 PT Time Calculation (min) (ACUTE ONLY): 24 min  Charges:  $Gait Training: 8-22 mins $Therapeutic Exercise: 8-22 mins                      Greggory Stallion 04/10/2020, 10:41 AM

## 2020-04-10 NOTE — Progress Notes (Signed)
Pt stable with no needs. No changes to note. Pt to d/c home when dme is delivered. No questions on d/c instructions and education.

## 2020-04-10 NOTE — Discharge Instructions (Signed)

## 2020-04-10 NOTE — TOC Progression Note (Signed)
Transition of Care [facility]) - Progression Note    Patient Details  Name: Debra Carpenter MRN: [id number] Date of Birth: 1965-12-03  Transition of Care [facility]) CM/SW Contact  Armanda Heritage, RN Phone Number: 04/10/2020, 10:22 AM  Clinical Narrative:    Adapt to deliver 3in1 to bedside.   Expected Discharge Plan: Home w Home Health Services Barriers to Discharge: No Barriers Identified  Expected Discharge Plan and Services Expected Discharge Plan: Home w Home Health Services         Expected Discharge Date: 04/10/20               DME Arranged: 3-N-1 DME Agency: [employer] Date DME Agency Contacted: 04/10/20 Time DME Agency Contacted: 1022 Representative spoke with at DME Agency: Ledell Noss HH Arranged: PT HH Agency: [facility] ([facility])(pre-arranged via MD office)         Social Determinants of Health (SDOH) Interventions    Readmission Risk Interventions No flowsheet data found.

## 2020-04-10 NOTE — Progress Notes (Signed)
Physical Therapy Treatment Patient Details Name: Debra Carpenter MRN: [id number] DOB: 12/08/1965 Today's Date: 04/10/2020    History of Present Illness Patient is 54 y.o. female s/p Rt TKA on 04/08/20 with PMH significant for HTN, GERD, anxiety and depression, angina, lap gastric band repair.     PT Comments    Pt is ready for d/c home today from a mobility standpoint. Pt and pt's sister demonstrated good understanding of safety precautions related to mobility. Pt/sister acknowledge pt needs min guard assist for mobility at the present time due to medication causing lightheadedness and drowsiness. Pt will have HHPT services to progress mobility and safety recommendations. Pt//sister have completed all mobility education and demonstrated clear understanding of HEP. Pt was able to go up/down 1 step with min guard assist. Pt will continue to benefit from HHPT services for improved mobility and Independence. Pt is d/c from  Acute PT services.  Follow Up Recommendations  Follow surgeon's recommendation for DC plan and follow-up therapies;Home health PT     Equipment Recommendations  3in1 (PT)    Recommendations for Other Services       Precautions / Restrictions Precautions Precautions: Fall Restrictions Weight Bearing Restrictions: No RLE Weight Bearing: Weight bearing as tolerated    Mobility  Bed Mobility Overal bed mobility: Needs Assistance Bed Mobility: Supine to Sit     Supine to sit: HOB elevated;Min assist Sit to supine: Min assist;HOB elevated   General bed mobility comments: Pt's sister assisted with leg lift into and out of bed  Transfers Overall transfer level: Needs assistance Equipment used: Rolling walker (2 wheeled) Transfers: Sit to/from Stand Sit to Stand: Min guard;From elevated surface            Ambulation/Gait Ambulation/Gait assistance: Min Web designer (Feet): 6 Feet Assistive device: Rolling walker (2 wheeled) Gait Pattern/deviations:  Step-to pattern;Decreased stance time - right;Decreased weight shift to right;Antalgic Gait velocity: decreased   General Gait Details: only walked to step for step training. Pt declined ambulation in the hall to conserve energy for d/c home after therapy.   Stairs Stairs: Yes Stairs assistance: Min guard Stair Management: No rails;Backwards;With walker Number of Stairs: 1 General stair comments: pt's sister assisted   Wheelchair Mobility    Modified Rankin (Stroke Patients Only)       Balance Overall balance assessment: Needs assistance Sitting-balance support: Feet supported Sitting balance-Leahy Scale: Good     Standing balance support: During functional activity;Single extremity supported Standing balance-Leahy Scale: Fair                              Cognition Arousal/Alertness: Awake/alert Behavior During Therapy: WFL for tasks assessed/performed Overall Cognitive Status: Within Functional Limits for tasks assessed                                        Exercises Total Joint Exercises Ankle Circles/Pumps: AROM;Strengthening;Both;10 reps;Supine Quad Sets: AROM;Both;10 reps;Supine Towel Squeeze: AROM;Both;10 reps;Supine Heel Slides: AAROM;Right;10 reps;Supine Hip ABduction/ADduction: AAROM;Right;10 reps;Supine Straight Leg Raises: AAROM;Strengthening;Right;10 reps;Supine Long Arc Quad: AAROM;Right;15 reps;Seated Knee Flexion: AAROM;Right;15 reps;Seated Goniometric ROM: 0-80*    General Comments General comments (skin integrity, edema, etc.): held stairs this am due to lightheaded      Pertinent Vitals/Pain Pain Assessment: Faces Pain Score: 5  Faces Pain Scale: Hurts little more Pain Location: Rt knee Pain Descriptors / Indicators:  Aching;Discomfort Pain Intervention(s): Limited activity within patient's tolerance;Repositioned;Monitored during session    Home Living                      Prior Function             PT Goals (current goals can now be found in the care plan section) Progress towards PT goals: Progressing toward goals    Frequency    7X/week      PT Plan Current plan remains appropriate    Co-evaluation              AM-PAC PT "6 Clicks" Mobility   Outcome Measure  Help needed turning from your back to your side while in a flat bed without using bedrails?: A Little Help needed moving from lying on your back to sitting on the side of a flat bed without using bedrails?: A Little Help needed moving to and from a bed to a chair (including a wheelchair)?: A Little Help needed standing up from a chair using your arms (e.g., wheelchair or bedside chair)?: A Little Help needed to walk in hospital room?: A Little Help needed climbing 3-5 steps with a railing? : A Little 6 Click Score: 18    End of Session Equipment Utilized During Treatment: Gait belt Activity Tolerance: Patient limited by pain Patient left: with call bell/phone within reach;with family/visitor present;in bed Nurse Communication: Mobility status PT Visit Diagnosis: Muscle weakness (generalized) (M62.81);Difficulty in walking, not elsewhere classified (R26.2)     Time: 4765-4650 PT Time Calculation (min) (ACUTE ONLY): 29 min  Charges:  $Gait Training: 8-22 mins $Therapeutic Exercise: 8-22 mins $Therapeutic Activity: 8-22 mins                      Greggory Stallion 04/10/2020, 12:41 PM

## 2020-04-10 NOTE — Plan of Care (Signed)
  Problem: Clinical Measurements: Goal: Respiratory complications will improve Outcome: Progressing   Problem: Clinical Measurements: Goal: Cardiovascular complication will be avoided Outcome: Progressing   Problem: Nutrition: Goal: Adequate nutrition will be maintained Outcome: Progressing   Problem: Coping: Goal: Level of anxiety will decrease Outcome: Progressing   Problem: Elimination: Goal: Will not experience complications related to bowel motility Outcome: Progressing   Problem: Safety: Goal: Ability to remain free from injury will improve Outcome: Progressing

## 2020-04-10 NOTE — Discharge Summary (Signed)
Patient ID: Debra Carpenter MRN: [id number] DOB/AGE: 1966/05/07 54 y.o.  Admit date: 04/08/2020 Discharge date: 04/10/2020  Admission Diagnoses:  Principal Problem:   Unilateral primary osteoarthritis, right knee Active Problems:   Status post total right knee replacement   Discharge Diagnoses:  Same  Past Medical History:  Diagnosis Date  . Anginal pain (HCC) 9/12   states negative cardiology workup exception of murmur  . Anxiety and depression   . Bony sclerosis 08/22/2011  . GERD (gastroesophageal reflux disease)   . Heart murmur   . Hx of bronchitis   . Hypertension   . Lung nodule 08/24/2011    Surgeries: Procedure(s): RIGHT TOTAL KNEE ARTHROPLASTY on 04/08/2020   Consultants:   Discharged Condition: Improved  Hospital Course: Evony Rezek is an 54 y.o. female who was admitted 04/08/2020 for operative treatment ofUnilateral primary osteoarthritis, right knee. Patient has severe unremitting pain that affects sleep, daily activities, and work/hobbies. After pre-op clearance the patient was taken to the operating room on 04/08/2020 and underwent  Procedure(s): RIGHT TOTAL KNEE ARTHROPLASTY.    Patient was given perioperative antibiotics:  Anti-infectives (From admission, onward)   Start     Dose/Rate Route Frequency Ordered Stop   04/08/20 1430  ceFAZolin (ANCEF) IVPB 1 g/50 mL premix     1 g 100 mL/hr over 30 Minutes Intravenous Every 6 hours 04/08/20 1137 04/08/20 2138   04/08/20 0600  ceFAZolin (ANCEF) 3 g in dextrose 5 % 50 mL IVPB     3 g 100 mL/hr over 30 Minutes Intravenous On call to O.R. 04/07/20 0347 04/08/20 0905       Patient was given sequential compression devices, early ambulation, and chemoprophylaxis to prevent DVT.  Patient benefited maximally from hospital stay and there were no complications.    Recent vital signs:  Patient Vitals for the past 24 hrs:  BP Temp Temp src Pulse Resp SpO2  04/10/20 0508 123/76 99 F (37.2 C) Oral 83 18 92 %   04/09/20 2207 131/78 100 F (37.8 C) Oral 74 18 100 %  04/09/20 1450 123/74 98.8 F (37.1 C) - 61 18 97 %  04/09/20 1100 105/73 98.8 F (37.1 C) Oral 62 18 97 %     Recent laboratory studies:  Recent Labs    04/09/20 0311  WBC 12.1*  HGB 11.2*  HCT 34.7*  PLT 243  NA 135  K 3.1*  CL 104  CO2 25  BUN 12  CREATININE 0.47  GLUCOSE 135*  CALCIUM 7.7*     Discharge Medications:   Allergies as of 04/10/2020      Reactions   Ace Inhibitors Anaphylaxis   Angioedema   Adhesive [tape] Hives   Amlodipine    LEE   Spironolactone    Weight gain   Statins    Myalgias with rosuvastatin 5mg  daily, pravastatin 10mg , and atorvastatin      Medication List    STOP taking these medications   aspirin 81 MG EC tablet Replaced by: aspirin 81 MG chewable tablet     TAKE these medications   acetaminophen 650 MG CR tablet Commonly known as: TYLENOL Take 1,300 mg by mouth every 8 (eight) hours as needed for pain.   AMBULATORY NON FORMULARY MEDICATION Walker.Use as needed.  Disp 1 M25.561   aspirin 81 MG chewable tablet Chew 1 tablet (81 mg total) by mouth 2 (two) times daily. Replaces: aspirin 81 MG EC tablet   cetirizine 10 MG tablet Commonly known as: ZYRTEC Take 10 mg  by mouth daily.   chlorthalidone 25 MG tablet Commonly known as: HYGROTON Take 1 tablet (25 mg total) by mouth daily. Please make overdue appt with Dr. Angelena Form before anymore refills. 2nd attempt What changed: additional instructions   methocarbamol 500 MG tablet Commonly known as: ROBAXIN Take 1 tablet (500 mg total) by mouth every 6 (six) hours as needed for muscle spasms.   oxyCODONE 5 MG immediate release tablet Commonly known as: Oxy IR/ROXICODONE Take 1-2 tablets (5-10 mg total) by mouth every 4 (four) hours as needed for moderate pain (pain score 4-6).   potassium chloride SA 20 MEQ tablet Commonly known as: KLOR-CON Take 1 tablet (20 mEq total) by mouth daily. Please make yearly appt with  Dr. Angelena Form for November for future refills. 1st attempt What changed:   when to take this  additional instructions            Durable Medical Equipment  (From admission, onward)         Start     Ordered   04/09/20 0822  For home use only DME 3 n 1  Once     04/09/20 0821   04/08/20 1158  DME Walker rolling  Once    Question Answer Comment  Walker: With 5 Inch Wheels   Patient needs a walker to treat with the following condition Status post total right knee replacement      04/08/20 1157          Diagnostic Studies: DG Knee Right Port  Result Date: 04/08/2020 CLINICAL DATA:  Status post knee replacement EXAM: PORTABLE RIGHT KNEE - 1-2 VIEW COMPARISON:  None. FINDINGS: The right knee demonstrates a total knee arthroplasty without evidence of hardware failure complication. There is no significant joint effusion. There is no fracture or dislocation. The alignment is anatomic. Post-surgical changes noted in the surrounding soft tissues. IMPRESSION: Right total knee arthroplasty. Electronically Signed   By: Kathreen Devoid   On: 04/08/2020 15:03    Disposition: Discharge disposition: 01-Home or [place] up.   Specialty: [place] Why: will be providing home health physical therapy visits Contact information: [address] [place] [address] [phone]        Mcarthur Rossetti, MD Follow up in 2 week(s).   Specialty: Orthopedic Surgery Contact information: [address] [place] [address] [phone]            Signed: Mcarthur Rossetti 04/10/2020, 8:31 AM

## 2020-04-11 ENCOUNTER — Encounter: Payer: Self-pay | Admitting: *Deleted

## 2020-04-11 NOTE — Anesthesia Postprocedure Evaluation (Signed)
Anesthesia Post Note  Patient: Debra Carpenter  Procedure(s) Performed: RIGHT TOTAL KNEE ARTHROPLASTY (Right Knee)     Patient location during evaluation: PACU Anesthesia Type: Spinal Level of consciousness: awake and alert Pain management: pain level controlled Vital Signs Assessment: post-procedure vital signs reviewed and stable Respiratory status: spontaneous breathing Cardiovascular status: stable Anesthetic complications: no    Last Vitals:  Vitals:   04/09/20 2207 04/10/20 0508  BP: 131/78 123/76  Pulse: 74 83  Resp: 18 18  Temp: 37.8 C 37.2 C  SpO2: 100% 92%    Last Pain:  Vitals:   04/10/20 1300  TempSrc:   PainSc: 6                  Lewie Loron

## 2020-04-12 DIAGNOSIS — Z471 Aftercare following joint replacement surgery: Secondary | ICD-10-CM | POA: Diagnosis not present

## 2020-04-12 DIAGNOSIS — Z8673 Personal history of transient ischemic attack (TIA), and cerebral infarction without residual deficits: Secondary | ICD-10-CM | POA: Diagnosis not present

## 2020-04-12 DIAGNOSIS — I10 Essential (primary) hypertension: Secondary | ICD-10-CM | POA: Diagnosis not present

## 2020-04-12 DIAGNOSIS — M5416 Radiculopathy, lumbar region: Secondary | ICD-10-CM | POA: Diagnosis not present

## 2020-04-12 DIAGNOSIS — F329 Major depressive disorder, single episode, unspecified: Secondary | ICD-10-CM | POA: Diagnosis not present

## 2020-04-12 DIAGNOSIS — J309 Allergic rhinitis, unspecified: Secondary | ICD-10-CM | POA: Diagnosis not present

## 2020-04-12 DIAGNOSIS — E785 Hyperlipidemia, unspecified: Secondary | ICD-10-CM | POA: Diagnosis not present

## 2020-04-12 DIAGNOSIS — Z96651 Presence of right artificial knee joint: Secondary | ICD-10-CM | POA: Diagnosis not present

## 2020-04-13 ENCOUNTER — Encounter: Payer: Self-pay | Admitting: Orthopaedic Surgery

## 2020-04-13 ENCOUNTER — Telehealth: Payer: Self-pay | Admitting: Orthopaedic Surgery

## 2020-04-13 DIAGNOSIS — Z96651 Presence of right artificial knee joint: Secondary | ICD-10-CM | POA: Diagnosis not present

## 2020-04-13 DIAGNOSIS — J309 Allergic rhinitis, unspecified: Secondary | ICD-10-CM | POA: Diagnosis not present

## 2020-04-13 DIAGNOSIS — E785 Hyperlipidemia, unspecified: Secondary | ICD-10-CM | POA: Diagnosis not present

## 2020-04-13 DIAGNOSIS — Z471 Aftercare following joint replacement surgery: Secondary | ICD-10-CM | POA: Diagnosis not present

## 2020-04-13 DIAGNOSIS — F329 Major depressive disorder, single episode, unspecified: Secondary | ICD-10-CM | POA: Diagnosis not present

## 2020-04-13 DIAGNOSIS — Z8673 Personal history of transient ischemic attack (TIA), and cerebral infarction without residual deficits: Secondary | ICD-10-CM | POA: Diagnosis not present

## 2020-04-13 DIAGNOSIS — I10 Essential (primary) hypertension: Secondary | ICD-10-CM | POA: Diagnosis not present

## 2020-04-13 DIAGNOSIS — M5416 Radiculopathy, lumbar region: Secondary | ICD-10-CM | POA: Diagnosis not present

## 2020-04-13 MED ORDER — IBUPROFEN 800 MG PO TABS
800.0000 mg | ORAL_TABLET | Freq: Three times a day (TID) | ORAL | 1 refills | Status: DC | PRN
Start: 2020-04-13 — End: 2020-06-15

## 2020-04-13 MED ORDER — TIZANIDINE HCL 4 MG PO TABS
4.0000 mg | ORAL_TABLET | Freq: Three times a day (TID) | ORAL | 1 refills | Status: DC | PRN
Start: 2020-04-13 — End: 2024-08-05

## 2020-04-13 NOTE — Telephone Encounter (Signed)
States what she is taking isn't helping

## 2020-04-13 NOTE — Telephone Encounter (Signed)
Daughter aware of the below message  

## 2020-04-13 NOTE — Telephone Encounter (Signed)
She had recent knee replacement surgery.  She is already on oxycodone and so there is nothing stronger I can call in.  I will add to this 800 mg ibuprofen as well as stronger Zanaflex to see if that combination will help with there is really nothing else that we can provide.

## 2020-04-13 NOTE — Telephone Encounter (Signed)
Pt called stating she had surgery on 04/08/20 and she's currently in a lot of pain and would like to see if she could be prescribed something to help with that.  [phone]

## 2020-04-14 ENCOUNTER — Other Ambulatory Visit: Payer: Self-pay | Admitting: Orthopaedic Surgery

## 2020-04-14 MED ORDER — OXYCODONE HCL 5 MG PO TABS: 5.0000 mg | ORAL_TABLET | ORAL | 0 refills | Status: DC | PRN

## 2020-04-15 ENCOUNTER — Telehealth: Payer: Self-pay | Admitting: Orthopaedic Surgery

## 2020-04-15 DIAGNOSIS — I10 Essential (primary) hypertension: Secondary | ICD-10-CM | POA: Diagnosis not present

## 2020-04-15 DIAGNOSIS — M5416 Radiculopathy, lumbar region: Secondary | ICD-10-CM | POA: Diagnosis not present

## 2020-04-15 DIAGNOSIS — J309 Allergic rhinitis, unspecified: Secondary | ICD-10-CM | POA: Diagnosis not present

## 2020-04-15 DIAGNOSIS — Z8673 Personal history of transient ischemic attack (TIA), and cerebral infarction without residual deficits: Secondary | ICD-10-CM | POA: Diagnosis not present

## 2020-04-15 DIAGNOSIS — F329 Major depressive disorder, single episode, unspecified: Secondary | ICD-10-CM | POA: Diagnosis not present

## 2020-04-15 DIAGNOSIS — Z471 Aftercare following joint replacement surgery: Secondary | ICD-10-CM | POA: Diagnosis not present

## 2020-04-15 DIAGNOSIS — E785 Hyperlipidemia, unspecified: Secondary | ICD-10-CM | POA: Diagnosis not present

## 2020-04-15 DIAGNOSIS — Z96651 Presence of right artificial knee joint: Secondary | ICD-10-CM | POA: Diagnosis not present

## 2020-04-15 NOTE — Telephone Encounter (Signed)
She hasn't been elevating at all, I asked her to do so. Just wanted you to see this message to see if you had any other suggestions

## 2020-04-15 NOTE — Telephone Encounter (Signed)
Patient called requesting a call back from Dr. Magnus Ivan. Patient states she had knee surgery and has questions and concerns. Patient phone number is [phone].

## 2020-04-17 ENCOUNTER — Other Ambulatory Visit: Payer: Self-pay | Admitting: Orthopaedic Surgery

## 2020-04-17 MED ORDER — HYDROCODONE-ACETAMINOPHEN 5-325 MG PO TABS: 1.0000 | ORAL_TABLET | Freq: Four times a day (QID) | ORAL | 0 refills | Status: DC | PRN

## 2020-04-17 NOTE — Progress Notes (Signed)
no

## 2020-04-18 DIAGNOSIS — M5416 Radiculopathy, lumbar region: Secondary | ICD-10-CM | POA: Diagnosis not present

## 2020-04-18 DIAGNOSIS — E785 Hyperlipidemia, unspecified: Secondary | ICD-10-CM | POA: Diagnosis not present

## 2020-04-18 DIAGNOSIS — Z471 Aftercare following joint replacement surgery: Secondary | ICD-10-CM | POA: Diagnosis not present

## 2020-04-18 DIAGNOSIS — I10 Essential (primary) hypertension: Secondary | ICD-10-CM | POA: Diagnosis not present

## 2020-04-18 DIAGNOSIS — Z8673 Personal history of transient ischemic attack (TIA), and cerebral infarction without residual deficits: Secondary | ICD-10-CM | POA: Diagnosis not present

## 2020-04-18 DIAGNOSIS — J309 Allergic rhinitis, unspecified: Secondary | ICD-10-CM | POA: Diagnosis not present

## 2020-04-18 DIAGNOSIS — F329 Major depressive disorder, single episode, unspecified: Secondary | ICD-10-CM | POA: Diagnosis not present

## 2020-04-18 DIAGNOSIS — Z96651 Presence of right artificial knee joint: Secondary | ICD-10-CM | POA: Diagnosis not present

## 2020-04-19 ENCOUNTER — Telehealth: Payer: Self-pay | Admitting: Orthopaedic Surgery

## 2020-04-19 NOTE — Telephone Encounter (Signed)
Patient called. She would like a referral for outpatient PT at [facility]. Her call back number is [phone]

## 2020-04-20 ENCOUNTER — Other Ambulatory Visit: Payer: Self-pay

## 2020-04-20 DIAGNOSIS — Z8673 Personal history of transient ischemic attack (TIA), and cerebral infarction without residual deficits: Secondary | ICD-10-CM | POA: Diagnosis not present

## 2020-04-20 DIAGNOSIS — E785 Hyperlipidemia, unspecified: Secondary | ICD-10-CM | POA: Diagnosis not present

## 2020-04-20 DIAGNOSIS — Z471 Aftercare following joint replacement surgery: Secondary | ICD-10-CM | POA: Diagnosis not present

## 2020-04-20 DIAGNOSIS — Z96651 Presence of right artificial knee joint: Secondary | ICD-10-CM | POA: Diagnosis not present

## 2020-04-20 DIAGNOSIS — F329 Major depressive disorder, single episode, unspecified: Secondary | ICD-10-CM | POA: Diagnosis not present

## 2020-04-20 DIAGNOSIS — I10 Essential (primary) hypertension: Secondary | ICD-10-CM | POA: Diagnosis not present

## 2020-04-20 DIAGNOSIS — J309 Allergic rhinitis, unspecified: Secondary | ICD-10-CM | POA: Diagnosis not present

## 2020-04-20 DIAGNOSIS — M5416 Radiculopathy, lumbar region: Secondary | ICD-10-CM | POA: Diagnosis not present

## 2020-04-20 NOTE — Telephone Encounter (Signed)
Referral sent 

## 2020-04-21 ENCOUNTER — Ambulatory Visit (INDEPENDENT_AMBULATORY_CARE_PROVIDER_SITE_OTHER): Payer: BC Managed Care – PPO | Admitting: Orthopaedic Surgery

## 2020-04-21 ENCOUNTER — Other Ambulatory Visit: Payer: Self-pay

## 2020-04-21 ENCOUNTER — Encounter: Payer: Self-pay | Admitting: Orthopaedic Surgery

## 2020-04-21 DIAGNOSIS — Z96651 Presence of right artificial knee joint: Secondary | ICD-10-CM

## 2020-04-21 NOTE — Progress Notes (Signed)
The patient is status post a right total knee arthroplasty.  She is 2 weeks out.  She has been taking her aspirin twice a day.  She has been having home therapy.  She states the furthest they have been able to flex her back to 70 degrees.  She denies any calf pain.  On exam her incision looks good.  I did remove all Steri-Strips in place new Steri-Strips.  Her calf is soft.  Her motor exam is normal in her ankle and foot.  I gave her reassurance that she can push this hard and get her knee moving.  She is only 54 years old and the pain she is experiencing is normal for someone so young.  She would like to be set up for outpatient physical therapy at [facility].  We will work on getting that scheduled.  When she does need more pain medication she will let us know and I will send in some more.  All questions and concerns were answered addressed.  I would like to see her back in 4 weeks to see what progress she is making with her mobility and range of motion of her right operative knee.  No x-rays are needed.

## 2020-04-22 ENCOUNTER — Other Ambulatory Visit: Payer: Self-pay | Admitting: Radiology

## 2020-04-22 ENCOUNTER — Telehealth: Payer: Self-pay | Admitting: Orthopaedic Surgery

## 2020-04-22 DIAGNOSIS — M5416 Radiculopathy, lumbar region: Secondary | ICD-10-CM | POA: Diagnosis not present

## 2020-04-22 DIAGNOSIS — J309 Allergic rhinitis, unspecified: Secondary | ICD-10-CM | POA: Diagnosis not present

## 2020-04-22 DIAGNOSIS — I10 Essential (primary) hypertension: Secondary | ICD-10-CM | POA: Diagnosis not present

## 2020-04-22 DIAGNOSIS — Z96651 Presence of right artificial knee joint: Secondary | ICD-10-CM | POA: Diagnosis not present

## 2020-04-22 DIAGNOSIS — Z8673 Personal history of transient ischemic attack (TIA), and cerebral infarction without residual deficits: Secondary | ICD-10-CM | POA: Diagnosis not present

## 2020-04-22 DIAGNOSIS — Z471 Aftercare following joint replacement surgery: Secondary | ICD-10-CM | POA: Diagnosis not present

## 2020-04-22 DIAGNOSIS — E785 Hyperlipidemia, unspecified: Secondary | ICD-10-CM | POA: Diagnosis not present

## 2020-04-22 DIAGNOSIS — F329 Major depressive disorder, single episode, unspecified: Secondary | ICD-10-CM | POA: Diagnosis not present

## 2020-04-22 NOTE — Telephone Encounter (Signed)
Flor from [facility].   She was informing us that the patient will no longer be receiving home health PT. They now need [place] to set her up for outpatient PT by next week, preferably at [facility]  Call back: [phone]

## 2020-04-22 NOTE — Telephone Encounter (Signed)
Order placed yesterday by Danella Maiers

## 2020-04-25 ENCOUNTER — Other Ambulatory Visit: Payer: Self-pay | Admitting: Orthopaedic Surgery

## 2020-04-26 DIAGNOSIS — I10 Essential (primary) hypertension: Secondary | ICD-10-CM | POA: Diagnosis not present

## 2020-04-26 DIAGNOSIS — Z96651 Presence of right artificial knee joint: Secondary | ICD-10-CM | POA: Diagnosis not present

## 2020-04-26 DIAGNOSIS — F329 Major depressive disorder, single episode, unspecified: Secondary | ICD-10-CM | POA: Diagnosis not present

## 2020-04-26 DIAGNOSIS — Z471 Aftercare following joint replacement surgery: Secondary | ICD-10-CM | POA: Diagnosis not present

## 2020-04-26 DIAGNOSIS — M5416 Radiculopathy, lumbar region: Secondary | ICD-10-CM | POA: Diagnosis not present

## 2020-04-26 DIAGNOSIS — E785 Hyperlipidemia, unspecified: Secondary | ICD-10-CM | POA: Diagnosis not present

## 2020-04-26 DIAGNOSIS — Z8673 Personal history of transient ischemic attack (TIA), and cerebral infarction without residual deficits: Secondary | ICD-10-CM | POA: Diagnosis not present

## 2020-04-26 DIAGNOSIS — J309 Allergic rhinitis, unspecified: Secondary | ICD-10-CM | POA: Diagnosis not present

## 2020-04-26 NOTE — Telephone Encounter (Signed)
Continue

## 2020-04-27 ENCOUNTER — Other Ambulatory Visit: Payer: Self-pay | Admitting: Orthopaedic Surgery

## 2020-04-27 MED ORDER — HYDROCODONE-ACETAMINOPHEN 5-325 MG PO TABS: 1.0000 | ORAL_TABLET | Freq: Four times a day (QID) | ORAL | 0 refills | Status: DC | PRN

## 2020-04-27 NOTE — Telephone Encounter (Signed)
Your pt. .  I refilled last when u were not here. :)

## 2020-04-28 DIAGNOSIS — I10 Essential (primary) hypertension: Secondary | ICD-10-CM | POA: Diagnosis not present

## 2020-04-28 DIAGNOSIS — Z471 Aftercare following joint replacement surgery: Secondary | ICD-10-CM | POA: Diagnosis not present

## 2020-04-28 DIAGNOSIS — Z8673 Personal history of transient ischemic attack (TIA), and cerebral infarction without residual deficits: Secondary | ICD-10-CM | POA: Diagnosis not present

## 2020-04-28 DIAGNOSIS — E785 Hyperlipidemia, unspecified: Secondary | ICD-10-CM | POA: Diagnosis not present

## 2020-04-28 DIAGNOSIS — F329 Major depressive disorder, single episode, unspecified: Secondary | ICD-10-CM | POA: Diagnosis not present

## 2020-04-28 DIAGNOSIS — M5416 Radiculopathy, lumbar region: Secondary | ICD-10-CM | POA: Diagnosis not present

## 2020-04-28 DIAGNOSIS — J309 Allergic rhinitis, unspecified: Secondary | ICD-10-CM | POA: Diagnosis not present

## 2020-04-28 DIAGNOSIS — Z96651 Presence of right artificial knee joint: Secondary | ICD-10-CM | POA: Diagnosis not present

## 2020-05-02 DIAGNOSIS — Z8673 Personal history of transient ischemic attack (TIA), and cerebral infarction without residual deficits: Secondary | ICD-10-CM | POA: Diagnosis not present

## 2020-05-02 DIAGNOSIS — M5416 Radiculopathy, lumbar region: Secondary | ICD-10-CM | POA: Diagnosis not present

## 2020-05-02 DIAGNOSIS — I10 Essential (primary) hypertension: Secondary | ICD-10-CM | POA: Diagnosis not present

## 2020-05-02 DIAGNOSIS — Z96651 Presence of right artificial knee joint: Secondary | ICD-10-CM | POA: Diagnosis not present

## 2020-05-02 DIAGNOSIS — J309 Allergic rhinitis, unspecified: Secondary | ICD-10-CM | POA: Diagnosis not present

## 2020-05-02 DIAGNOSIS — E785 Hyperlipidemia, unspecified: Secondary | ICD-10-CM | POA: Diagnosis not present

## 2020-05-02 DIAGNOSIS — Z471 Aftercare following joint replacement surgery: Secondary | ICD-10-CM | POA: Diagnosis not present

## 2020-05-02 DIAGNOSIS — F329 Major depressive disorder, single episode, unspecified: Secondary | ICD-10-CM | POA: Diagnosis not present

## 2020-05-03 ENCOUNTER — Ambulatory Visit: Payer: BC Managed Care – PPO | Attending: Orthopaedic Surgery | Admitting: Physical Therapy

## 2020-05-03 ENCOUNTER — Other Ambulatory Visit: Payer: Self-pay

## 2020-05-03 ENCOUNTER — Encounter: Payer: Self-pay | Admitting: Physical Therapy

## 2020-05-03 DIAGNOSIS — R600 Localized edema: Secondary | ICD-10-CM

## 2020-05-03 DIAGNOSIS — M6281 Muscle weakness (generalized): Secondary | ICD-10-CM | POA: Diagnosis not present

## 2020-05-03 DIAGNOSIS — M25561 Pain in right knee: Secondary | ICD-10-CM

## 2020-05-03 DIAGNOSIS — R262 Difficulty in walking, not elsewhere classified: Secondary | ICD-10-CM | POA: Diagnosis not present

## 2020-05-03 NOTE — Therapy (Signed)
[facility] [address] [place], [place], [address] Phone: [phone]   Fax:  [phone]  Physical Therapy Evaluation  Patient Details  Name: Debra Carpenter MRN: [id number] Date of Birth: 06-18-1966 Referring Provider (PT): Doneen Poisson   Encounter Date: 05/03/2020  PT End of Session - 05/03/20 0835    Visit Number  1    Date for PT Re-Evaluation  07/03/20    PT Start Time  0755    PT Stop Time  0840    PT Time Calculation (min)  45 min    Activity Tolerance  Patient tolerated treatment well    Behavior During Therapy  [facility] for tasks assessed/performed       Past Medical History:  Diagnosis Date   Anginal pain (HCC) 9/12   states negative cardiology workup exception of murmur   Anxiety and depression    Bony sclerosis 08/22/2011   GERD (gastroesophageal reflux disease)    Heart murmur    Hx of bronchitis    Hypertension    Lung nodule 08/24/2011    Past Surgical History:  Procedure Laterality Date   ABDOMINAL HYSTERECTOMY  2007   LAPAROSCOPIC GASTRIC BANDING  2011   LAPAROSCOPIC GASTRIC SLEEVE RESECTION N/A 07/21/2013   Procedure: LAPAROSCOPIC GASTRIC SLEEVE RESECTION, LYSIS OF ADHESIONS, EGD;  Surgeon: Lodema Pilot, DO;  Location: [facility];  Service: General;  Laterality: N/A;   LAPAROSCOPIC LYSIS OF ADHESIONS  07/21/2013   Procedure: LAPAROSCOPIC LYSIS OF ADHESIONS;  Surgeon: Lodema Pilot, DO;  Location: [facility];  Service: General;;   LAPAROSCOPIC REPAIR AND REMOVAL OF GASTRIC BAND N/A 05/05/2013   Procedure: LAPAROSCOPIC REMOVAL OF ADJUSTABLE GASTRIC BAND AND PORT, ENDOSCOPY ;  Surgeon: Lodema Pilot, DO;  Location: [facility];  Service: General;  Laterality: N/A;  laparoscopic removal of adjustable gastric band AND PORT    TOTAL KNEE ARTHROPLASTY Right 04/08/2020   Procedure: RIGHT TOTAL KNEE ARTHROPLASTY;  Surgeon: Kathryne Hitch, MD;  Location: [facility];  Service: Orthopedics;  Laterality:  Right;   UPPER GI ENDOSCOPY  07/21/2013   Procedure: UPPER GI ENDOSCOPY;  Surgeon: Lodema Pilot, DO;  Location: [facility];  Service: General;;    There were no vitals filed for this visit.   Subjective Assessment - 05/03/20 0755    Subjective  Pt reports to clinic s/p TKA on 04/08/2020. Pt reports some nerve pain and swelling in R knee. Pt reports to clinic with RW but states she uses SPC most of the time around the house; just wantd to use the walker for the long hallway.    Limitations  Standing;Walking;House hold activities    Patient Stated Goals  regain motion    Currently in Pain?  Yes    Pain Score  2     Pain Location  Knee    Pain Orientation  Right    Pain Descriptors / Indicators  Burning;Aching    Pain Type  Surgical pain    Pain Radiating Towards  pain occasionally radiates from medial side of knee to medial ankle    Pain Onset  1 to 4 weeks ago    Pain Frequency  Constant    Aggravating Factors   walking, standing, bending knee    Pain Relieving Factors  pain meds, ice, rest         [facility] PT Assessment - 05/03/20 0001      Assessment   Medical Diagnosis  R TKA    Referring Provider (PT)  Jean Rosenthal    Onset Date/Surgical Date  04/08/20    Next MD Visit  05/19/2020    Prior Therapy  PT for knee      Precautions   Precautions  None      Restrictions   Weight Bearing Restrictions  No      Balance Screen   Has the patient fallen in the past 6 months  No    Has the patient had a decrease in activity level because of a fear of falling?   No    Is the patient reluctant to leave their home because of a fear of falling?   No      Prior Function   Level of Independence  Independent    Vocation  Full time employment    Vocation Requirements  mostly sitting      Observation/Other Assessments   Observations  pt scar healing well      Observation/Other Assessments-Edema    Edema  Circumferential      Circumferential Edema   Circumferential - Right  52  cm mid patella    Circumferential - Left   48 cm mid patella      Sensation   Additional Comments  mild deficits in sensation on medial RLE      Functional Tests   Functional tests  Sit to Stand      Sit to Stand   Comments  leaning to LLE; difficulty rising from low surface      ROM / Strength   AROM / PROM / Strength  AROM;Strength;PROM      AROM   AROM Assessment Site  Knee    Right/Left Knee  Right    Right Knee Extension  8    Right Knee Flexion  75      PROM   PROM Assessment Site  Knee    Right/Left Knee  Right    Right Knee Extension  7    Right Knee Flexion  77      Ambulation/Gait   Ambulation/Gait  Yes    Ambulation/Gait Assistance  6: Modified independent (Device/Increase time)    Ambulation Distance (Feet)  25 Feet    Assistive device  Straight cane    Gait Pattern  Step-to pattern;Decreased stance time - right;Decreased step length - left;Decreased hip/knee flexion - right;Decreased weight shift to right;Antalgic    Ambulation Surface  Level    Gait velocity  diminished    Gait Comments  antalgic gait on R. No LOB or assist needed with gait with SPC.                   Objective measurements completed on examination: See above findings.      [facility] Adult PT Treatment/Exercise - 05/03/20 0001      Standardized Balance Assessment   Standardized Balance Assessment  Timed Up and Go Test      Timed Up and Go Test   TUG  Normal TUG    Normal TUG (seconds)  19.6   with SPC     Exercises   Exercises  Knee/Hip      Lumbar Exercises: Seated   Long Arc Quad on Chair  Right;2 sets;10 reps      Knee/Hip Exercises: Stretches   Knee: Self-Stretch to increase Flexion  Right;2 reps;30 seconds    Other Knee/Hip Stretches  knee extension stretch with quad contraction x10, hold 3 sec each      Modalities   Modalities  Vasopneumatic  Vasopneumatic   Number Minutes Vasopneumatic   15 minutes    Vasopnuematic Location   Knee    Vasopneumatic  Pressure  Medium    Vasopneumatic Temperature   36             PT Education - 05/03/20 0834    Education Details  Pt educated on POC and HEP    Person(s) Educated  Patient    Methods  Explanation;Demonstration;Handout    Comprehension  Verbalized understanding;Returned demonstration       PT Short Term Goals - 05/03/20 0839      PT SHORT TERM GOAL #1   Title  independent with initial HEP    Time  2    Period  Weeks    Status  New    Target Date  05/17/20        PT Long Term Goals - 05/03/20 0840      PT LONG TERM GOAL #1   Title  Pt will achieve 110 deg of knee flexion    Baseline  77    Time  8    Period  Weeks    Status  New    Target Date  06/28/20      PT LONG TERM GOAL #2   Title  Pt will demonstrate 0 deg knee extension    Baseline  7    Time  8    Period  Weeks    Status  New    Target Date  06/28/20      PT LONG TERM GOAL #3   Title  Pt will demonstrate TUG time <15 sec with no AD    Baseline  19 sec with SPC    Time  8    Period  Weeks    Status  New    Target Date  06/28/20      PT LONG TERM GOAL #4   Title  Pt will ascend/descend stairs step over step    Time  8    Period  Weeks    Status  New    Target Date  06/28/20      PT LONG TERM GOAL #5   Title  Pt will walk around building with no AD and supervision assist    Time  2    Period  Weeks    Status  New    Target Date  06/28/20             Plan - 05/03/20 0835    Clinical Impression Statement  Pt presents to clinic s/p R TKA on 04/08/2020. Pt ambulated with SPC around clinic with supervision assist and no LOB; antalgic gait on R and some difficulty turning. Pt demonstrates knee flexion at 75 and knee extension at 8. Pt is painful with knee flexion stretches. Circumferential edema of R knee >4cm difference from L knee.    Personal Factors and Comorbidities  Comorbidity 1    Comorbidities  HTN    Examination-Activity Limitations  Carry;Lift;Locomotion  Level;Transfers;Sit;Stairs;Stand    Examination-Participation Restrictions  Community Activity;Driving;Interpersonal Relationship    Stability/Clinical Decision Making  Stable/Uncomplicated    Clinical Decision Making  Low    Rehab Potential  Good    PT Frequency  3x / week    PT Duration  8 weeks    PT Treatment/Interventions  ADLs/Self Care Home Management;Cryotherapy;Electrical Stimulation;Iontophoresis 4mg /ml Dexamethasone;Moist Heat;Traction;Ultrasound;Therapeutic activities;Gait training;Therapeutic exercise;Functional mobility training;Balance training;Neuromuscular re-education;Patient/family education;Manual techniques;Stair training;Scar mobilization;Passive range of motion;Vasopneumatic Device;Taping    PT  Next Visit Plan  Initiate knee ROM/flexibility/strengthening. Modalities/manual as indicated.    PT Home Exercise Plan  LAQ, knee flexion stretch seated, knee extension stretch with quad activation    Consulted and Agree with Plan of Care  Patient       Patient will benefit from skilled therapeutic intervention in order to improve the following deficits and impairments:  Improper body mechanics, Pain, Increased muscle spasms, Decreased range of motion, Decreased strength, Impaired flexibility, Difficulty walking, Abnormal gait, Impaired sensation  Visit Diagnosis: Acute pain of right knee  Muscle weakness (generalized)  Difficulty in walking, not elsewhere classified  Localized edema     Problem List Patient Active Problem List   Diagnosis Date Noted   Status post total right knee replacement 04/08/2020   Unilateral primary osteoarthritis, right knee 03/14/2020   Lumbar radiculopathy, right 07/15/2019   Hyperlipidemia 11/28/2018   TIA (transient ischemic attack) 10/21/2018   Essential hypertension 09/24/2018   Chest pain at rest    Degenerative joint disease of knee, right 05/08/2018   Pain of left heel 02/23/2013   Bacterial vaginosis 12/25/2011    Allergic rhinitis 12/25/2011   Lung nodule 08/24/2011   Atypical chest pain 08/22/2011   Depression 08/22/2011   Preventative health care 08/22/2011   Lysle Rubens, PT, DPT Maryanna Shape Gregoire Bennis 05/03/2020, 9:27 AM  [facility] [address] [place], [place], [address] Phone: [phone]   Fax:  [phone]  Name: Merian Wroe MRN: [id number] Date of Birth: Mar 20, 1966

## 2020-05-03 NOTE — Patient Instructions (Signed)
Access Code: [facility] URL: [email or web address] Date: 05/03/2020 Prepared by: Lysle Rubens  Exercises Seated Knee Extension Stretch with Chair - 1 x daily - 7 x weekly - 10 reps - 3 sets Seated Knee Flexion AAROM - 3 x daily - 7 x weekly - 10 reps - 1 sets Seated Knee Flexion Extension AROM - 3 x daily - 7 x weekly - 10 reps - 1 sets Seated Long Arc Quad - 3 x daily - 7 x weekly - 10 reps - 1 sets

## 2020-05-05 ENCOUNTER — Encounter: Payer: Self-pay | Admitting: Physical Therapy

## 2020-05-05 ENCOUNTER — Other Ambulatory Visit: Payer: Self-pay

## 2020-05-05 ENCOUNTER — Ambulatory Visit: Payer: BC Managed Care – PPO | Admitting: Physical Therapy

## 2020-05-05 DIAGNOSIS — R600 Localized edema: Secondary | ICD-10-CM

## 2020-05-05 DIAGNOSIS — M6281 Muscle weakness (generalized): Secondary | ICD-10-CM

## 2020-05-05 DIAGNOSIS — R262 Difficulty in walking, not elsewhere classified: Secondary | ICD-10-CM | POA: Diagnosis not present

## 2020-05-05 DIAGNOSIS — M25561 Pain in right knee: Secondary | ICD-10-CM | POA: Diagnosis not present

## 2020-05-05 NOTE — Therapy (Signed)
[facility] [address] [place], [place], [address] Phone: [phone]   Fax:  [phone]  Physical Therapy Treatment  Patient Details  Name: Debra Carpenter MRN: [id number] Date of Birth: Aug 02, 1966 Referring Provider (PT): Jean Rosenthal   Encounter Date: 05/05/2020   PT End of Session - 05/05/20 1138    Visit Number 2    Date for PT Re-Evaluation 07/03/20    PT Start Time 1055    PT Stop Time 1152    PT Time Calculation (min) 57 min    Activity Tolerance Patient tolerated treatment well    Behavior During Therapy [facility] for tasks assessed/performed           Past Medical History:  Diagnosis Date  . Anginal pain ([place]) 9/12   states negative cardiology workup exception of murmur  . Anxiety and depression   . Bony sclerosis 08/22/2011  . GERD (gastroesophageal reflux disease)   . Heart murmur   . Hx of bronchitis   . Hypertension   . Lung nodule 08/24/2011    Past Surgical History:  Procedure Laterality Date  . ABDOMINAL HYSTERECTOMY  2007  . LAPAROSCOPIC GASTRIC BANDING  2011  . LAPAROSCOPIC GASTRIC SLEEVE RESECTION N/A 07/21/2013   Procedure: LAPAROSCOPIC GASTRIC SLEEVE RESECTION, LYSIS OF ADHESIONS, EGD;  Surgeon: Madilyn Hook, DO;  Location: [facility];  Service: General;  Laterality: N/A;  . LAPAROSCOPIC LYSIS OF ADHESIONS  07/21/2013   Procedure: LAPAROSCOPIC LYSIS OF ADHESIONS;  Surgeon: Madilyn Hook, DO;  Location: [facility];  Service: General;;  . LAPAROSCOPIC REPAIR AND REMOVAL OF GASTRIC BAND N/A 05/05/2013   Procedure: LAPAROSCOPIC REMOVAL OF ADJUSTABLE GASTRIC BAND AND PORT, ENDOSCOPY ;  Surgeon: Madilyn Hook, DO;  Location: [facility];  Service: General;  Laterality: N/A;  laparoscopic removal of adjustable gastric band AND PORT   . TOTAL KNEE ARTHROPLASTY Right 04/08/2020   Procedure: RIGHT TOTAL KNEE ARTHROPLASTY;  Surgeon: Mcarthur Rossetti, MD;  Location: [facility];  Service: Orthopedics;  Laterality:  Right;  . UPPER GI ENDOSCOPY  07/21/2013   Procedure: UPPER GI ENDOSCOPY;  Surgeon: Madilyn Hook, DO;  Location: [facility];  Service: General;;    There were no vitals filed for this visit.   Subjective Assessment - 05/05/20 1054    Subjective "Its ok" pt ambulates in clinic with [facility]    Currently in Pain? Yes    Pain Score 3     Pain Location Knee    Pain Orientation Right                             [facility] Adult PT Treatment/Exercise - 05/05/20 0001      Lumbar Exercises: Aerobic   Nustep L2 x6 min       Knee/Hip Exercises: Machines for Strengthening   Cybex Leg Press 20lb 2x10, RLE no weight x5       Knee/Hip Exercises: Seated   Long Arc Quad Both;2 sets;10 reps    Long Arc Quad Weight 2 lbs.    Other Seated Knee/Hip Exercises Quad sets RLE 2x10    Hamstring Curl Right;2 sets;10 reps    Hamstring Limitations red Tband     Sit to Sand 2 sets;5 reps;without UE support;with UE support   elevated UBE seat      Knee/Hip Exercises: Supine   Short Arc Quad Sets Strengthening;Right;10 reps;2 sets      Vasopneumatic   Number Minutes Vasopneumatic  15 minutes    Vasopnuematic Location  Knee    Vasopneumatic Pressure Medium    Vasopneumatic Temperature  36      Manual Therapy   Manual Therapy Passive ROM    Passive ROM R knee flex amd ext with some end range stretching                    PT Short Term Goals - 05/03/20 0839      PT SHORT TERM GOAL #1   Title independent with initial HEP    Time 2    Period Weeks    Status New    Target Date 05/17/20             PT Long Term Goals - 05/03/20 0840      PT LONG TERM GOAL #1   Title Pt will achieve 110 deg of knee flexion    Baseline 77    Time 8    Period Weeks    Status New    Target Date 06/28/20      PT LONG TERM GOAL #2   Title Pt will demonstrate 0 deg knee extension    Baseline 7    Time 8    Period Weeks    Status New    Target Date 06/28/20      PT LONG TERM GOAL #3    Title Pt will demonstrate TUG time <15 sec with no AD    Baseline 19 sec with SPC    Time 8    Period Weeks    Status New    Target Date 06/28/20      PT LONG TERM GOAL #4   Title Pt will ascend/descend stairs step over step    Time 8    Period Weeks    Status New    Target Date 06/28/20      PT LONG TERM GOAL #5   Title Pt will walk around building with no AD and supervision assist    Time 2    Period Weeks    Status New    Target Date 06/28/20                 Plan - 05/05/20 1139    Clinical Impression Statement Pt did well with an initial progression to TE. Pain reported with end range R knee flexion and extension.  Strong tactile cue needed to get contraction with quad set. Cues to hold contraction with LAQ. Tolerated leg press well.    Personal Factors and Comorbidities Comorbidity 1    Examination-Activity Limitations Carry;Lift;Locomotion Level;Transfers;Sit;Stairs;Stand    Examination-Participation Restrictions Community Activity;Driving;Interpersonal Relationship    Stability/Clinical Decision Making Stable/Uncomplicated    Rehab Potential Good    PT Frequency 3x / week    PT Duration 8 weeks    PT Treatment/Interventions ADLs/Self Care Home Management;Cryotherapy;Electrical Stimulation;Iontophoresis 4mg /ml Dexamethasone;Moist Heat;Traction;Ultrasound;Therapeutic activities;Gait training;Therapeutic exercise;Functional mobility training;Balance training;Neuromuscular re-education;Patient/family education;Manual techniques;Stair training;Scar mobilization;Passive range of motion;Vasopneumatic Device;Taping    PT Next Visit Plan Initiate knee ROM/flexibility/strengthening. Modalities/manual as indicated.           Patient will benefit from skilled therapeutic intervention in order to improve the following deficits and impairments:  Improper body mechanics, Pain, Increased muscle spasms, Decreased range of motion, Decreased strength, Impaired flexibility,  Difficulty walking, Abnormal gait, Impaired sensation  Visit Diagnosis: Acute pain of right knee  Muscle weakness (generalized)  Difficulty in walking, not elsewhere classified  Localized edema     Problem  List Patient Active Problem List   Diagnosis Date Noted  . Status post total right knee replacement 04/08/2020  . Unilateral primary osteoarthritis, right knee 03/14/2020  . Lumbar radiculopathy, right 07/15/2019  . Hyperlipidemia 11/28/2018  . TIA (transient ischemic attack) 10/21/2018  . Essential hypertension 09/24/2018  . Chest pain at rest   . Degenerative joint disease of knee, right 05/08/2018  . Pain of left heel 02/23/2013  . Bacterial vaginosis 12/25/2011  . Allergic rhinitis 12/25/2011  . Lung nodule 08/24/2011  . Atypical chest pain 08/22/2011  . Depression 08/22/2011  . Preventative health care 08/22/2011    Grayce Sessions, PTA 05/05/2020, 11:43 AM  [facility] [address] [place], [place], [address] Phone: [phone]   Fax:  [phone]  Name: Debra Carpenter MRN: [id number] Date of Birth: 05-Oct-1966

## 2020-05-07 ENCOUNTER — Other Ambulatory Visit: Payer: Self-pay | Admitting: Cardiovascular Disease

## 2020-05-09 ENCOUNTER — Other Ambulatory Visit: Payer: Self-pay | Admitting: Orthopaedic Surgery

## 2020-05-09 ENCOUNTER — Other Ambulatory Visit: Payer: Self-pay

## 2020-05-09 ENCOUNTER — Ambulatory Visit: Payer: BC Managed Care – PPO | Admitting: Physical Therapy

## 2020-05-09 ENCOUNTER — Encounter: Payer: Self-pay | Admitting: Physical Therapy

## 2020-05-09 DIAGNOSIS — R262 Difficulty in walking, not elsewhere classified: Secondary | ICD-10-CM | POA: Diagnosis not present

## 2020-05-09 DIAGNOSIS — M25561 Pain in right knee: Secondary | ICD-10-CM | POA: Diagnosis not present

## 2020-05-09 DIAGNOSIS — R600 Localized edema: Secondary | ICD-10-CM

## 2020-05-09 DIAGNOSIS — M6281 Muscle weakness (generalized): Secondary | ICD-10-CM | POA: Diagnosis not present

## 2020-05-09 NOTE — Therapy (Signed)
[facility] [address] [place], [place], [address] Phone: [phone]   Fax:  [phone]  Physical Therapy Treatment  Patient Details  Name: Debra Carpenter MRN: [id number] Date of Birth: Mar 22, 1966 Referring Provider (PT): Doneen Poisson   Encounter Date: 05/09/2020   PT End of Session - 05/09/20 1230    Visit Number 3    Date for PT Re-Evaluation 07/03/20    PT Start Time 1145    PT Stop Time 1238    PT Time Calculation (min) 53 min    Activity Tolerance Patient tolerated treatment well    Behavior During Therapy [facility] for tasks assessed/performed           Past Medical History:  Diagnosis Date  . Anginal pain (HCC) 9/12   states negative cardiology workup exception of murmur  . Anxiety and depression   . Bony sclerosis 08/22/2011  . GERD (gastroesophageal reflux disease)   . Heart murmur   . Hx of bronchitis   . Hypertension   . Lung nodule 08/24/2011    Past Surgical History:  Procedure Laterality Date  . ABDOMINAL HYSTERECTOMY  2007  . LAPAROSCOPIC GASTRIC BANDING  2011  . LAPAROSCOPIC GASTRIC SLEEVE RESECTION N/A 07/21/2013   Procedure: LAPAROSCOPIC GASTRIC SLEEVE RESECTION, LYSIS OF ADHESIONS, EGD;  Surgeon: Lodema Pilot, DO;  Location: [facility];  Service: General;  Laterality: N/A;  . LAPAROSCOPIC LYSIS OF ADHESIONS  07/21/2013   Procedure: LAPAROSCOPIC LYSIS OF ADHESIONS;  Surgeon: Lodema Pilot, DO;  Location: [facility];  Service: General;;  . LAPAROSCOPIC REPAIR AND REMOVAL OF GASTRIC BAND N/A 05/05/2013   Procedure: LAPAROSCOPIC REMOVAL OF ADJUSTABLE GASTRIC BAND AND PORT, ENDOSCOPY ;  Surgeon: Lodema Pilot, DO;  Location: [facility];  Service: General;  Laterality: N/A;  laparoscopic removal of adjustable gastric band AND PORT   . TOTAL KNEE ARTHROPLASTY Right 04/08/2020   Procedure: RIGHT TOTAL KNEE ARTHROPLASTY;  Surgeon: Kathryne Hitch, MD;  Location: [facility];  Service: Orthopedics;  Laterality:  Right;  . UPPER GI ENDOSCOPY  07/21/2013   Procedure: UPPER GI ENDOSCOPY;  Surgeon: Lodema Pilot, DO;  Location: [facility];  Service: General;;    There were no vitals filed for this visit.   Subjective Assessment - 05/09/20 1148    Subjective "It is going pretty good, I just want to get this bend right"    Currently in Pain? No/denies              [facility] PT Assessment - 05/09/20 0001      AROM   Right Knee Extension 13   against gravity   Right Knee Flexion 89                         [facility] Adult PT Treatment/Exercise - 05/09/20 0001      Lumbar Exercises: Aerobic   Nustep L3 x6 min       Knee/Hip Exercises: Machines for Strengthening   Cybex Leg Press 40lb 2x10, RLE no weight x5       Knee/Hip Exercises: Seated   Long Arc Quad Both;2 sets;10 reps    Long Arc Quad Weight 2 lbs.    Hamstring Curl Right;10 reps;3 sets    Hamstring Limitations red Tband     Sit to Sand 2 sets;5 reps;without UE support   airex on UBE      Vasopneumatic   Number Minutes Vasopneumatic  10 minutes    Vasopnuematic  Location  Knee    Vasopneumatic Pressure Medium    Vasopneumatic Temperature  36      Manual Therapy   Manual Therapy Passive ROM    Passive ROM R knee flex amd ext with some end range stretching                    PT Short Term Goals - 05/03/20 0839      PT SHORT TERM GOAL #1   Title independent with initial HEP    Time 2    Period Weeks    Status New    Target Date 05/17/20             PT Long Term Goals - 05/03/20 0840      PT LONG TERM GOAL #1   Title Pt will achieve 110 deg of knee flexion    Baseline 77    Time 8    Period Weeks    Status New    Target Date 06/28/20      PT LONG TERM GOAL #2   Title Pt will demonstrate 0 deg knee extension    Baseline 7    Time 8    Period Weeks    Status New    Target Date 06/28/20      PT LONG TERM GOAL #3   Title Pt will demonstrate TUG time <15 sec with no AD    Baseline 19 sec with  SPC    Time 8    Period Weeks    Status New    Target Date 06/28/20      PT LONG TERM GOAL #4   Title Pt will ascend/descend stairs step over step    Time 8    Period Weeks    Status New    Target Date 06/28/20      PT LONG TERM GOAL #5   Title Pt will walk around building with no AD and supervision assist    Time 2    Period Weeks    Status New    Target Date 06/28/20                 Plan - 05/09/20 1234    Clinical Impression Statement Pt has progressed increasing her R knee flexion ROM. Extension was less but was taken against gravity. Increase weight tolerated on legg press with some one over pressure to RLE into extension. Some pain at the end range of PROM.    Personal Factors and Comorbidities Comorbidity 1    Comorbidities HTN    Examination-Activity Limitations Carry;Lift;Locomotion Level;Transfers;Sit;Stairs;Stand    Examination-Participation Restrictions Community Activity;Driving;Interpersonal Relationship    Stability/Clinical Decision Making Stable/Uncomplicated    Rehab Potential Good    PT Frequency 3x / week    PT Duration 8 weeks    PT Treatment/Interventions ADLs/Self Care Home Management;Cryotherapy;Electrical Stimulation;Iontophoresis 4mg /ml Dexamethasone;Moist Heat;Traction;Ultrasound;Therapeutic activities;Gait training;Therapeutic exercise;Functional mobility training;Balance training;Neuromuscular re-education;Patient/family education;Manual techniques;Stair training;Scar mobilization;Passive range of motion;Vasopneumatic Device;Taping    PT Next Visit Plan Initiate knee ROM/flexibility/strengthening. Modalities/manual as indicated.           Patient will benefit from skilled therapeutic intervention in order to improve the following deficits and impairments:  Improper body mechanics, Pain, Increased muscle spasms, Decreased range of motion, Decreased strength, Impaired flexibility, Difficulty walking, Abnormal gait, Impaired sensation  Visit  Diagnosis: Muscle weakness (generalized)  Difficulty in walking, not elsewhere classified  Acute pain of right knee  Localized edema     Problem List  Patient Active Problem List   Diagnosis Date Noted  . Status post total right knee replacement 04/08/2020  . Unilateral primary osteoarthritis, right knee 03/14/2020  . Lumbar radiculopathy, right 07/15/2019  . Hyperlipidemia 11/28/2018  . TIA (transient ischemic attack) 10/21/2018  . Essential hypertension 09/24/2018  . Chest pain at rest   . Degenerative joint disease of knee, right 05/08/2018  . Pain of left heel 02/23/2013  . Bacterial vaginosis 12/25/2011  . Allergic rhinitis 12/25/2011  . Lung nodule 08/24/2011  . Atypical chest pain 08/22/2011  . Depression 08/22/2011  . Preventative health care 08/22/2011    Grayce Sessions, PTA 05/09/2020, 12:38 PM  [facility] [address] [place], [place], [address] Phone: [phone]   Fax:  [phone]  Name: Mikhia Dusek MRN: [id number] Date of Birth: 09-11-1966

## 2020-05-09 NOTE — Telephone Encounter (Signed)
Called and spoke with patient she has not been seen since 2019 other than hypertension clinic in early 2020. I explained we have not refilled chlorthalidone 25 mg daily since 10/2019 for only 15 day supply. She said she has been getting it from her PCP and must have sent it to wrong dr. She stated she will contact PCP and pharmacy.

## 2020-05-10 MED ORDER — HYDROCODONE-ACETAMINOPHEN 5-325 MG PO TABS: 1.0000 | ORAL_TABLET | Freq: Four times a day (QID) | ORAL | 0 refills | Status: DC | PRN

## 2020-05-10 NOTE — Telephone Encounter (Signed)
Please advise 

## 2020-05-11 ENCOUNTER — Other Ambulatory Visit: Payer: Self-pay

## 2020-05-11 ENCOUNTER — Ambulatory Visit: Payer: BC Managed Care – PPO | Admitting: Physical Therapy

## 2020-05-11 ENCOUNTER — Encounter: Payer: Self-pay | Admitting: Physical Therapy

## 2020-05-11 DIAGNOSIS — R262 Difficulty in walking, not elsewhere classified: Secondary | ICD-10-CM | POA: Diagnosis not present

## 2020-05-11 DIAGNOSIS — M25561 Pain in right knee: Secondary | ICD-10-CM | POA: Diagnosis not present

## 2020-05-11 DIAGNOSIS — R600 Localized edema: Secondary | ICD-10-CM

## 2020-05-11 DIAGNOSIS — M6281 Muscle weakness (generalized): Secondary | ICD-10-CM | POA: Diagnosis not present

## 2020-05-11 NOTE — Therapy (Signed)
[address] [place], [place], [address] Phone: [phone]   Fax:  [phone]  Physical Therapy Treatment  Patient Details  Name: Debra Carpenter MRN: [id number] Date of Birth: [birth year] Referring Provider (PT): Jean Rosenthal   Encounter Date: 05/11/2020   PT End of Session - 05/11/20 1143    Visit Number 4    Date for PT Re-Evaluation 07/03/20    PT Start Time 1101    PT Stop Time 1144    PT Time Calculation (min) 43 min    Activity Tolerance Patient tolerated treatment well    Behavior During Therapy [facility] for tasks assessed/performed           Past Medical History:  Diagnosis Date  . Anginal pain ([place]) 9/12   states negative cardiology workup exception of murmur  . Anxiety and depression   . Bony sclerosis 08/22/2011  . GERD (gastroesophageal reflux disease)   . Heart murmur   . Hx of bronchitis   . Hypertension   . Lung nodule 08/24/2011    Past Surgical History:  Procedure Laterality Date  . ABDOMINAL HYSTERECTOMY  2007  . LAPAROSCOPIC GASTRIC BANDING  2011  . LAPAROSCOPIC GASTRIC SLEEVE RESECTION N/A 07/21/2013   Procedure: LAPAROSCOPIC GASTRIC SLEEVE RESECTION, LYSIS OF ADHESIONS, EGD;  Surgeon: Madilyn Hook, DO;  [facility];  Service: General;  Laterality: N/A;  . LAPAROSCOPIC LYSIS OF ADHESIONS  07/21/2013   Procedure: LAPAROSCOPIC LYSIS OF ADHESIONS;  Surgeon: Madilyn Hook, DO;  [facility];  Service: General;;  . LAPAROSCOPIC REPAIR AND REMOVAL OF GASTRIC BAND N/A 05/05/2013   Procedure: LAPAROSCOPIC REMOVAL OF ADJUSTABLE GASTRIC BAND AND PORT, ENDOSCOPY ;  Surgeon: Madilyn Hook, DO;  [facility];  Service: General;  Laterality: N/A;  laparoscopic removal of adjustable gastric band AND PORT   . TOTAL KNEE ARTHROPLASTY Right 04/08/2020   Procedure: RIGHT TOTAL KNEE ARTHROPLASTY;  Surgeon: Mcarthur Rossetti, MD;  [facility];  Service: Orthopedics;  Laterality:  Right;  . UPPER GI ENDOSCOPY  07/21/2013   Procedure: UPPER GI ENDOSCOPY;  Surgeon: Madilyn Hook, DO;  [facility];  Service: General;;    There were no vitals filed for this visit.   Subjective Assessment - 05/11/20 1104    Subjective "I am all right, Im about a 4 today"    Currently in Pain? Yes    Pain Score 4     Pain Location Knee    Pain Orientation Right                             [facility] Adult PT Treatment/Exercise - 05/11/20 0001      Lumbar Exercises: Aerobic   Nustep L3 x6 min       Knee/Hip Exercises: Machines for Strengthening   Cybex Knee Extension 10lb 2x10, RLE 5lb 2x10     Cybex Knee Flexion 25lb 2x10, RLE 15lb 2x10     Cybex Leg Press 40lb 2x10, RLE 20lb x10, RLE no weight x10      Knee/Hip Exercises: Standing   Walking with Sports Cord 30lb front/back x3    Other Standing Knee Exercises Alt 6 inbox taps 2x10      Knee/Hip Exercises: Seated   Sit to Sand 2 sets;10 reps;without UE support   2 airex on mat  PT Short Term Goals - 05/11/20 1150      PT SHORT TERM GOAL #1   Title independent with initial HEP    Status Achieved             PT Long Term Goals - 05/11/20 1150      PT LONG TERM GOAL #1   Title Pt will achieve 110 deg of knee flexion    Status On-going      PT LONG TERM GOAL #2   Title Pt will demonstrate 0 deg knee extension    Status On-going      PT LONG TERM GOAL #3   Title Pt will demonstrate TUG time <15 sec with no AD    Status On-going      PT LONG TERM GOAL #4   Title Pt will ascend/descend stairs step over step    Status On-going      PT LONG TERM GOAL #5   Title Pt will walk around building with no AD and supervision assist    Status On-going                 Plan - 05/11/20 1145    Clinical Impression Statement Denied modality today. She did really well today with good effort. Progressed to some SL strengthening interventions without issue. Cues need to  squeeze R quad with SL extensions. Cues needed not to circumduct RLE with alt step ups. Good stability with resisted gait.    Personal Factors and Comorbidities Comorbidity 1    Comorbidities HTN    Examination-Activity Limitations Carry;Lift;Locomotion Level;Transfers;Sit;Stairs;Stand    Examination-Participation Restrictions Community Activity;Driving;Interpersonal Relationship    Stability/Clinical Decision Making Stable/Uncomplicated    Rehab Potential Good    PT Frequency 3x / week    PT Duration 8 weeks    PT Treatment/Interventions ADLs/Self Care Home Management;Cryotherapy;Electrical Stimulation;Iontophoresis 4mg /ml Dexamethasone;Moist Heat;Traction;Ultrasound;Therapeutic activities;Gait training;Therapeutic exercise;Functional mobility training;Balance training;Neuromuscular re-education;Patient/family education;Manual techniques;Stair training;Scar mobilization;Passive range of motion;Vasopneumatic Device;Taping    PT Next Visit Plan Initiate knee ROM/flexibility/strengthening. Modalities/manual as indicated.           Patient will benefit from skilled therapeutic intervention in order to improve the following deficits and impairments:  Improper body mechanics, Pain, Increased muscle spasms, Decreased range of motion, Decreased strength, Impaired flexibility, Difficulty walking, Abnormal gait, Impaired sensation  Visit Diagnosis: Muscle weakness (generalized)  Acute pain of right knee  Difficulty in walking, not elsewhere classified  Localized edema     Problem List Patient Active Problem List   Diagnosis Date Noted  . Status post total right knee replacement 04/08/2020  . Unilateral primary osteoarthritis, right knee 03/14/2020  . Lumbar radiculopathy, right 07/15/2019  . Hyperlipidemia 11/28/2018  . TIA (transient ischemic attack) 10/21/2018  . Essential hypertension 09/24/2018  . Chest pain at rest   . Degenerative joint disease of knee, right 05/08/2018  . Pain  of left heel 02/23/2013  . Bacterial vaginosis 12/25/2011  . Allergic rhinitis 12/25/2011  . Lung nodule 08/24/2011  . Atypical chest pain 08/22/2011  . Depression 08/22/2011  . Preventative health care 08/22/2011    08/24/2011 05/11/2020, 11:51 AM  [facility] [address] [place], [place], [place] Phone: [phone]   Fax:  [phone]  Name: Debra Carpenter MRN: Cresenciano Genre Date of Birth: [birth year]

## 2020-05-13 ENCOUNTER — Other Ambulatory Visit: Payer: Self-pay

## 2020-05-13 ENCOUNTER — Ambulatory Visit: Payer: BC Managed Care – PPO | Admitting: Physical Therapy

## 2020-05-13 ENCOUNTER — Other Ambulatory Visit: Payer: Self-pay | Admitting: Orthopaedic Surgery

## 2020-05-13 DIAGNOSIS — R262 Difficulty in walking, not elsewhere classified: Secondary | ICD-10-CM

## 2020-05-13 DIAGNOSIS — M25561 Pain in right knee: Secondary | ICD-10-CM | POA: Diagnosis not present

## 2020-05-13 DIAGNOSIS — R600 Localized edema: Secondary | ICD-10-CM | POA: Diagnosis not present

## 2020-05-13 DIAGNOSIS — M6281 Muscle weakness (generalized): Secondary | ICD-10-CM | POA: Diagnosis not present

## 2020-05-13 NOTE — Therapy (Signed)
[address] [place], [place], [address] Phone: [phone]   Fax:  [phone]  Physical Therapy Treatment  Patient Details  Name: Debra Carpenter MRN: [id number] Date of Birth: 05-18-1966 Referring Provider (PT): Jean Rosenthal   Encounter Date: 05/13/2020   PT End of Session - 05/13/20 1145    Visit Number 5    Date for PT Re-Evaluation 07/03/20    PT Start Time 1100    PT Stop Time 1145    PT Time Calculation (min) 45 min           Past Medical History:  Diagnosis Date  . Anginal pain ([place]) 9/12   states negative cardiology workup exception of murmur  . Anxiety and depression   . Bony sclerosis 08/22/2011  . GERD (gastroesophageal reflux disease)   . Heart murmur   . Hx of bronchitis   . Hypertension   . Lung nodule 08/24/2011    Past Surgical History:  Procedure Laterality Date  . ABDOMINAL HYSTERECTOMY  2007  . LAPAROSCOPIC GASTRIC BANDING  2011  . LAPAROSCOPIC GASTRIC SLEEVE RESECTION N/A 07/21/2013   Procedure: LAPAROSCOPIC GASTRIC SLEEVE RESECTION, LYSIS OF ADHESIONS, EGD;  Surgeon: Madilyn Hook, DO;  [facility];  Service: General;  Laterality: N/A;  . LAPAROSCOPIC LYSIS OF ADHESIONS  07/21/2013   Procedure: LAPAROSCOPIC LYSIS OF ADHESIONS;  Surgeon: Madilyn Hook, DO;  [facility];  Service: General;;  . LAPAROSCOPIC REPAIR AND REMOVAL OF GASTRIC BAND N/A 05/05/2013   Procedure: LAPAROSCOPIC REMOVAL OF ADJUSTABLE GASTRIC BAND AND PORT, ENDOSCOPY ;  Surgeon: Madilyn Hook, DO;  [facility];  Service: General;  Laterality: N/A;  laparoscopic removal of adjustable gastric band AND PORT   . TOTAL KNEE ARTHROPLASTY Right 04/08/2020   Procedure: RIGHT TOTAL KNEE ARTHROPLASTY;  Surgeon: Mcarthur Rossetti, MD;  [facility];  Service: Orthopedics;  Laterality: Right;  . UPPER GI ENDOSCOPY  07/21/2013   Procedure: UPPER GI ENDOSCOPY;  Surgeon: Madilyn Hook, DO;  [facility];  Service: General;;    There were no vitals filed for this visit.   Subjective Assessment - 05/13/20 1059    Subjective oaky-stiff    Currently in Pain? Yes    Pain Score 4     Pain Location Knee    Pain Orientation Right              [facility] PT Assessment - 05/13/20 0001      AROM   Right Knee Extension 6    Right Knee Flexion 110   measurements taken after MT                        [facility] Adult PT Treatment/Exercise - 05/13/20 0001      Ambulation/Gait   Gait Comments worked on gait with increase knee flexion in swing phase and heel strike while maiintaining upright trunk      Lumbar Exercises: Aerobic   Recumbent Bike off 5 min-full revolutions    Nustep L4 x6 min - LE only      Knee/Hip Exercises: Machines for Strengthening   Cybex Knee Extension 10# up with 2 ecc RT lowering. RT only 5 # 2 sets 10    Cybex Knee Flexion 15# 2 sets 10 RT LE only    Cybex Leg Press 40lb 2x10, RLE 20lb x10, RLE no weight x10      Knee/Hip Exercises: Seated   Other Seated  Knee/Hip Exercises SAQ red tband 3 sec TKE hold 15 x    Other Seated Knee/Hip Exercises TKE red tband hold 3 sec 15 x    Hamstring Curl Strengthening;Right;15 reps   green tband 3 sec hold     Manual Therapy   Manual Therapy Passive ROM    Passive ROM R knee flex and ext with some end range stretching                  PT Education - 05/13/20 1143    Education Details step stretch for flexion and posterior knee stretch with heels off step    Person(s) Educated Patient    Methods Explanation;Demonstration    Comprehension Verbalized understanding;Returned demonstration            PT Short Term Goals - 05/11/20 1150      PT SHORT TERM GOAL #1   Title independent with initial HEP    Status Achieved             PT Long Term Goals - 05/11/20 1150      PT LONG TERM GOAL #1   Title Pt will achieve 110 deg of knee flexion    Status On-going      PT LONG TERM GOAL #2   Title  Pt will demonstrate 0 deg knee extension    Status On-going      PT LONG TERM GOAL #3   Title Pt will demonstrate TUG time <15 sec with no AD    Status On-going      PT LONG TERM GOAL #4   Title Pt will ascend/descend stairs step over step    Status On-going      PT LONG TERM GOAL #5   Title Pt will walk around building with no AD and supervision assist    Status On-going                 Plan - 05/13/20 1145    Clinical Impression Statement exellent increase in AROM- measurements taken seated after manual stretching. pt tolerated ther ex well and pleased to be able ot make full revolutions on bike. instructed pt in 2 add'l strteches for home as well as using Mederma and CFM to healed incision. also instretcted in gait for increased flexion in swing phase.    PT Treatment/Interventions ADLs/Self Care Home Management;Cryotherapy;Electrical Stimulation;Iontophoresis 4mg /ml Dexamethasone;Moist Heat;Traction;Ultrasound;Therapeutic activities;Gait training;Therapeutic exercise;Functional mobility training;Balance training;Neuromuscular re-education;Patient/family education;Manual techniques;Stair training;Scar mobilization;Passive range of motion;Vasopneumatic Device;Taping    PT Next Visit Plan Initiate knee ROM/flexibility/strengthening. Modalities/manual as indicated.           Patient will benefit from skilled therapeutic intervention in order to improve the following deficits and impairments:  Improper body mechanics, Pain, Increased muscle spasms, Decreased range of motion, Decreased strength, Impaired flexibility, Difficulty walking, Abnormal gait, Impaired sensation  Visit Diagnosis: Muscle weakness (generalized)  Acute pain of right knee  Difficulty in walking, not elsewhere classified     Problem List Patient Active Problem List   Diagnosis Date Noted  . Status post total right knee replacement 04/08/2020  . Unilateral primary osteoarthritis, right knee 03/14/2020   . Lumbar radiculopathy, right 07/15/2019  . Hyperlipidemia 11/28/2018  . TIA (transient ischemic attack) 10/21/2018  . Essential hypertension 09/24/2018  . Chest pain at rest   . Degenerative joint disease of knee, right 05/08/2018  . Pain of left heel 02/23/2013  . Bacterial vaginosis 12/25/2011  . Allergic rhinitis 12/25/2011  . Lung nodule 08/24/2011  .  Atypical chest pain 08/22/2011  . Depression 08/22/2011  . Preventative health care 08/22/2011    Debra Carpenter,ANGIE PTA 05/13/2020, 11:47 AM  [facility] [address] [place], [place], [address] Phone: [phone]   Fax:  [phone]  Name: Debra Carpenter MRN: [id number] Date of Birth: 10/04/1966

## 2020-05-13 NOTE — Telephone Encounter (Signed)
Can you advise? CB/GC out of office.

## 2020-05-16 ENCOUNTER — Other Ambulatory Visit: Payer: Self-pay

## 2020-05-16 ENCOUNTER — Encounter: Payer: Self-pay | Admitting: Physical Therapy

## 2020-05-16 ENCOUNTER — Ambulatory Visit: Payer: BC Managed Care – PPO | Admitting: Physical Therapy

## 2020-05-16 ENCOUNTER — Inpatient Hospital Stay: Payer: BC Managed Care – PPO | Admitting: Orthopaedic Surgery

## 2020-05-16 DIAGNOSIS — R262 Difficulty in walking, not elsewhere classified: Secondary | ICD-10-CM | POA: Diagnosis not present

## 2020-05-16 DIAGNOSIS — M6281 Muscle weakness (generalized): Secondary | ICD-10-CM

## 2020-05-16 DIAGNOSIS — M25561 Pain in right knee: Secondary | ICD-10-CM

## 2020-05-16 DIAGNOSIS — R600 Localized edema: Secondary | ICD-10-CM | POA: Diagnosis not present

## 2020-05-16 NOTE — Therapy (Signed)
[facility] [address] [place], [place], [address] Phone: [phone]   Fax:  [phone]  Physical Therapy Treatment  Patient Details  Name: Debra Carpenter MRN: [id number] Date of Birth: September 27, 1966 Referring Provider (PT): Doneen Poisson   Encounter Date: 05/16/2020   PT End of Session - 05/16/20 1144    Visit Number 6    Date for PT Re-Evaluation 07/03/20    PT Start Time 1100    PT Stop Time 1145    PT Time Calculation (min) 45 min    Activity Tolerance Patient tolerated treatment well    Behavior During Therapy [facility] for tasks assessed/performed           Past Medical History:  Diagnosis Date  . Anginal pain (HCC) 9/12   states negative cardiology workup exception of murmur  . Anxiety and depression   . Bony sclerosis 08/22/2011  . GERD (gastroesophageal reflux disease)   . Heart murmur   . Hx of bronchitis   . Hypertension   . Lung nodule 08/24/2011    Past Surgical History:  Procedure Laterality Date  . ABDOMINAL HYSTERECTOMY  2007  . LAPAROSCOPIC GASTRIC BANDING  2011  . LAPAROSCOPIC GASTRIC SLEEVE RESECTION N/A 07/21/2013   Procedure: LAPAROSCOPIC GASTRIC SLEEVE RESECTION, LYSIS OF ADHESIONS, EGD;  Surgeon: Lodema Pilot, DO;  Location: [facility];  Service: General;  Laterality: N/A;  . LAPAROSCOPIC LYSIS OF ADHESIONS  07/21/2013   Procedure: LAPAROSCOPIC LYSIS OF ADHESIONS;  Surgeon: Lodema Pilot, DO;  Location: [facility];  Service: General;;  . LAPAROSCOPIC REPAIR AND REMOVAL OF GASTRIC BAND N/A 05/05/2013   Procedure: LAPAROSCOPIC REMOVAL OF ADJUSTABLE GASTRIC BAND AND PORT, ENDOSCOPY ;  Surgeon: Lodema Pilot, DO;  Location: [facility];  Service: General;  Laterality: N/A;  laparoscopic removal of adjustable gastric band AND PORT   . TOTAL KNEE ARTHROPLASTY Right 04/08/2020   Procedure: RIGHT TOTAL KNEE ARTHROPLASTY;  Surgeon: Kathryne Hitch, MD;  [facility]: [facility];  Service: Orthopedics;  Laterality:  Right;  . UPPER GI ENDOSCOPY  07/21/2013   Procedure: UPPER GI ENDOSCOPY;  Surgeon: Lodema Pilot, DO;  [facility]: [facility];  Service: General;;    There were no vitals filed for this visit.   Subjective Assessment - 05/16/20 1102    Subjective Just stiffness and tightness    Currently in Pain? Yes    Pain Score 3     Pain Location Knee    Pain Orientation Right                             [facility] Adult PT Treatment/Exercise - 05/16/20 0001      Lumbar Exercises: Aerobic   Tread Mill I5 R3 x 3 min     Nustep L4 x5 min       Knee/Hip Exercises: Machines for Strengthening   Cybex Leg Press 30lb 2x10, RLE TKE 20lb 2x10      Knee/Hip Exercises: Standing   Forward Step Up Right;2 sets;10 reps;Hand Hold: 0;Step Height: 6"    Walking with Sports Cord 30lb 4 way x3 each       Knee/Hip Exercises: Seated   Sit to Sand 2 sets;10 reps;without UE support   airex on tabls      Manual Therapy   Manual Therapy Passive ROM    Passive ROM R knee flex and ext with some end range stretching  PT Short Term Goals - 05/11/20 1150      PT SHORT TERM GOAL #1   Title independent with initial HEP    Status Achieved             PT Long Term Goals - 05/11/20 1150      PT LONG TERM GOAL #1   Title Pt will achieve 110 deg of knee flexion    Status On-going      PT LONG TERM GOAL #2   Title Pt will demonstrate 0 deg knee extension    Status On-going      PT LONG TERM GOAL #3   Title Pt will demonstrate TUG time <15 sec with no AD    Status On-going      PT LONG TERM GOAL #4   Title Pt will ascend/descend stairs step over step    Status On-going      PT LONG TERM GOAL #5   Title Pt will walk around building with no AD and supervision assist    Status On-going                 Plan - 05/16/20 1144    Clinical Impression Statement Progressed some low level step ups with little compensation. ROM remains well overall. Cue needed to  increase R knee flexion with swing phase. Progressed to elliptical warm up without issues. She reports having a elliptical at home she may try. Some pain at the end range of MT.    Personal Factors and Comorbidities Comorbidity 1    Comorbidities HTN    Examination-Activity Limitations Carry;Lift;Locomotion Level;Transfers;Sit;Stairs;Stand    Examination-Participation Restrictions Community Activity;Driving;Interpersonal Relationship    Stability/Clinical Decision Making Stable/Uncomplicated    Rehab Potential Good    PT Frequency 3x / week    PT Duration 8 weeks    PT Treatment/Interventions ADLs/Self Care Home Management;Cryotherapy;Electrical Stimulation;Iontophoresis 4mg /ml Dexamethasone;Moist Heat;Traction;Ultrasound;Therapeutic activities;Gait training;Therapeutic exercise;Functional mobility training;Balance training;Neuromuscular re-education;Patient/family education;Manual techniques;Stair training;Scar mobilization;Passive range of motion;Vasopneumatic Device;Taping    PT Next Visit Plan Initiate knee ROM/flexibility/strengthening. Modalities/manual as indicated.           Patient will benefit from skilled therapeutic intervention in order to improve the following deficits and impairments:  Improper body mechanics, Pain, Increased muscle spasms, Decreased range of motion, Decreased strength, Impaired flexibility, Difficulty walking, Abnormal gait, Impaired sensation  Visit Diagnosis: Acute pain of right knee  Muscle weakness (generalized)  Difficulty in walking, not elsewhere classified  Localized edema     Problem List Patient Active Problem List   Diagnosis Date Noted  . Status post total right knee replacement 04/08/2020  . Unilateral primary osteoarthritis, right knee 03/14/2020  . Lumbar radiculopathy, right 07/15/2019  . Hyperlipidemia 11/28/2018  . TIA (transient ischemic attack) 10/21/2018  . Essential hypertension 09/24/2018  . Chest pain at rest   .  Degenerative joint disease of knee, right 05/08/2018  . Pain of left heel 02/23/2013  . Bacterial vaginosis 12/25/2011  . Allergic rhinitis 12/25/2011  . Lung nodule 08/24/2011  . Atypical chest pain 08/22/2011  . Depression 08/22/2011  . Preventative health care 08/22/2011    Scot Jun, PTA 05/16/2020, 11:51 AM  [place] [address] [place], [place], [address] Phone: [phone]   Fax:  [phone]  Name: Debra Carpenter MRN: [id number] Date of Birth: July 19, 1966

## 2020-05-18 ENCOUNTER — Encounter: Payer: Self-pay | Admitting: Physical Therapy

## 2020-05-18 ENCOUNTER — Ambulatory Visit: Payer: BC Managed Care – PPO | Admitting: Physical Therapy

## 2020-05-18 ENCOUNTER — Other Ambulatory Visit: Payer: Self-pay

## 2020-05-18 DIAGNOSIS — M25561 Pain in right knee: Secondary | ICD-10-CM

## 2020-05-18 DIAGNOSIS — R262 Difficulty in walking, not elsewhere classified: Secondary | ICD-10-CM | POA: Diagnosis not present

## 2020-05-18 DIAGNOSIS — M6281 Muscle weakness (generalized): Secondary | ICD-10-CM

## 2020-05-18 DIAGNOSIS — R600 Localized edema: Secondary | ICD-10-CM | POA: Diagnosis not present

## 2020-05-18 NOTE — Therapy (Deleted)
[place], [place], [address] Phone: [phone]   Fax:  [phone]  Physical Therapy Treatment  Patient Details  Name: Debra Carpenter MRN: [id number] Date of Birth: 1966-11-02 Referring Provider (PT): Jean Rosenthal   Encounter Date: 05/18/2020   PT End of Session - 05/18/20 1140    Visit Number 7    Date for PT Re-Evaluation 07/03/20    PT Start Time 1100    PT Stop Time 1150    PT Time Calculation (min) 50 min    Activity Tolerance Patient tolerated treatment well    Behavior During Therapy [facility] for tasks assessed/performed           Past Medical History:  Diagnosis Date   Anginal pain ([place]) 9/12   states negative cardiology workup exception of murmur   Anxiety and depression    Bony sclerosis 08/22/2011   GERD (gastroesophageal reflux disease)    Heart murmur    Hx of bronchitis    Hypertension    Lung nodule 08/24/2011    Past Surgical History:  Procedure Laterality Date   ABDOMINAL HYSTERECTOMY  2007   LAPAROSCOPIC GASTRIC BANDING  2011   LAPAROSCOPIC GASTRIC SLEEVE RESECTION N/A 07/21/2013   Procedure: LAPAROSCOPIC GASTRIC SLEEVE RESECTION, LYSIS OF ADHESIONS, EGD;  Surgeon: Madilyn Hook, DO;  [facility];  Service: General;  Laterality: N/A;   LAPAROSCOPIC LYSIS OF ADHESIONS  07/21/2013   Procedure: LAPAROSCOPIC LYSIS OF ADHESIONS;  Surgeon: Madilyn Hook, DO;  [facility];  Service: General;;   LAPAROSCOPIC REPAIR AND REMOVAL OF GASTRIC BAND N/A 05/05/2013   Procedure: LAPAROSCOPIC REMOVAL OF ADJUSTABLE GASTRIC BAND AND PORT, ENDOSCOPY ;  Surgeon: Madilyn Hook, DO;  [facility];  Service: General;  Laterality: N/A;  laparoscopic removal of adjustable gastric band AND PORT    TOTAL KNEE ARTHROPLASTY Right 04/08/2020   Procedure: RIGHT TOTAL KNEE ARTHROPLASTY;  Surgeon: Mcarthur Rossetti, MD;  [facility];  Service: Orthopedics;  Laterality:  Right;   UPPER GI ENDOSCOPY  07/21/2013   Procedure: UPPER GI ENDOSCOPY;  Surgeon: Madilyn Hook, DO;  [facility];  Service: General;;    There were no vitals filed for this visit.   Subjective Assessment - 05/18/20 1103    Subjective "Im ok"    Currently in Pain? Yes    Pain Score 3     Pain Location Knee              [facility] PT Assessment - 05/18/20 0001      AROM   Right Knee Extension 6    Right Knee Flexion 108                         [facility] Adult PT Treatment/Exercise - 05/18/20 0001      Lumbar Exercises: Aerobic   Tread Mill I 11 R3 x 3 min     Recumbent Bike off 5 min-full revolutions      Knee/Hip Exercises: Machines for Strengthening   Cybex Knee Extension RLE 5l2 2x10     Cybex Knee Flexion RLE 15lb 2x15     Cybex Leg Press 40lb 2x10, RLE 20lb 2x5      Knee/Hip Exercises: Standing   Forward Step Up Right;Hand Hold: 0;Step Height: 6";5 reps;3 sets      Knee/Hip Exercises: Seated   Sit to Sand 3 sets;5 reps;without UE support   blue chair  PT Short Term Goals - 05/11/20 1150      PT SHORT TERM GOAL #1   Title independent with initial HEP    Status Achieved             PT Long Term Goals - 05/18/20 1143      PT LONG TERM GOAL #1   Title Pt will achieve 110 deg of knee flexion    Status Partially Met      PT LONG TERM GOAL #2   Title Pt will demonstrate 0 deg knee extension    Status On-going                 Plan - 05/18/20 1141    Clinical Impression Statement Pt is doing  well overall, ambulates with a rigid RLE, but she can correct with cues. She is lacking ~ 6 degrees of R knee extension. She did well with all strengthening interventions cues given for TKE with knee extension. Some compensation needed with step ups and with sit to stands from standard chair. Instructed pt bot to have anything under her knee at home when sitting at watching TV.    Personal Factors and Comorbidities  Comorbidity 1    Comorbidities HTN    Examination-Activity Limitations Carry;Lift;Locomotion Level;Transfers;Sit;Stairs;Stand    Examination-Participation Restrictions Community Activity;Driving;Interpersonal Relationship    Stability/Clinical Decision Making Stable/Uncomplicated    Rehab Potential Good    PT Frequency 3x / week    PT Duration 8 weeks    PT Treatment/Interventions ADLs/Self Care Home Management;Cryotherapy;Electrical Stimulation;Iontophoresis 47m/ml Dexamethasone;Moist Heat;Traction;Ultrasound;Therapeutic activities;Gait training;Therapeutic exercise;Functional mobility training;Balance training;Neuromuscular re-education;Patient/family education;Manual techniques;Stair training;Scar mobilization;Passive range of motion;Vasopneumatic Device;Taping    PT Next Visit Plan knee ROM/flexibility/strengthening. Modalities/manual as indicated.           Patient will benefit from skilled therapeutic intervention in order to improve the following deficits and impairments:  Improper body mechanics, Pain, Increased muscle spasms, Decreased range of motion, Decreased strength, Impaired flexibility, Difficulty walking, Abnormal gait, Impaired sensation  Visit Diagnosis: No diagnosis found.     Problem List Patient Active Problem List   Diagnosis Date Noted   Status post total right knee replacement 04/08/2020   Unilateral primary osteoarthritis, right knee 03/14/2020   Lumbar radiculopathy, right 07/15/2019   Hyperlipidemia 11/28/2018   TIA (transient ischemic attack) 10/21/2018   Essential hypertension 09/24/2018   Chest pain at rest    Degenerative joint disease of knee, right 05/08/2018   Pain of left heel 02/23/2013   Bacterial vaginosis 12/25/2011   Allergic rhinitis 12/25/2011   Lung nodule 08/24/2011   Atypical chest pain 08/22/2011   Depression 08/22/2011   Preventative health care 08/22/2011    RScot Jun6/23/2021, 11:45 AM  [place] [address]Phone: [phone]  Fax:  [phone] Name: Debra FordeMRN: [id number] of Birth: 803-11-1965

## 2020-05-18 NOTE — Therapy (Signed)
[place], [place], [address] Phone: [phone]   Fax:  [phone]  Physical Therapy Treatment  Patient Details  Name: Debra Carpenter MRN: [id number] Date of Birth: March 16, 1966 Referring Provider (PT): Jean Rosenthal   Encounter Date: 05/18/2020   PT End of Session - 05/18/20 1140    Visit Number 7    Date for PT Re-Evaluation 07/03/20    PT Start Time 1100    PT Stop Time 1150    PT Time Calculation (min) 50 min    Activity Tolerance Patient tolerated treatment well    Behavior During Therapy [facility] for tasks assessed/performed           Past Medical History:  Diagnosis Date  . Anginal pain ([place]) 9/12   states negative cardiology workup exception of murmur  . Anxiety and depression   . Bony sclerosis 08/22/2011  . GERD (gastroesophageal reflux disease)   . Heart murmur   . Hx of bronchitis   . Hypertension   . Lung nodule 08/24/2011    Past Surgical History:  Procedure Laterality Date  . ABDOMINAL HYSTERECTOMY  2007  . LAPAROSCOPIC GASTRIC BANDING  2011  . LAPAROSCOPIC GASTRIC SLEEVE RESECTION N/A 07/21/2013   Procedure: LAPAROSCOPIC GASTRIC SLEEVE RESECTION, LYSIS OF ADHESIONS, EGD;  Surgeon: Madilyn Hook, DO;  [facility];  Service: General;  Laterality: N/A;  . LAPAROSCOPIC LYSIS OF ADHESIONS  07/21/2013   Procedure: LAPAROSCOPIC LYSIS OF ADHESIONS;  Surgeon: Madilyn Hook, DO;  [facility];  Service: General;;  . LAPAROSCOPIC REPAIR AND REMOVAL OF GASTRIC BAND N/A 05/05/2013   Procedure: LAPAROSCOPIC REMOVAL OF ADJUSTABLE GASTRIC BAND AND PORT, ENDOSCOPY ;  Surgeon: Madilyn Hook, DO;  [facility];  Service: General;  Laterality: N/A;  laparoscopic removal of adjustable gastric band AND PORT   . TOTAL KNEE ARTHROPLASTY Right 04/08/2020   Procedure: RIGHT TOTAL KNEE ARTHROPLASTY;  Surgeon: Mcarthur Rossetti, MD;  [facility];  Service: Orthopedics;  Laterality:  Right;  . UPPER GI ENDOSCOPY  07/21/2013   Procedure: UPPER GI ENDOSCOPY;  Surgeon: Madilyn Hook, DO;  [facility];  Service: General;;    There were no vitals filed for this visit.   Subjective Assessment - 05/18/20 1103    Subjective "Im ok"    Currently in Pain? Yes    Pain Score 3     Pain Location Knee              [facility] PT Assessment - 05/18/20 0001      AROM   Right Knee Extension 6    Right Knee Flexion 108                         [facility] Adult PT Treatment/Exercise - 05/18/20 0001      Lumbar Exercises: Aerobic   Tread Mill I 11 R3 x 3 min     Recumbent Bike off 5 min-full revolutions      Knee/Hip Exercises: Machines for Strengthening   Cybex Knee Extension RLE 5l2 2x10     Cybex Knee Flexion RLE 15lb 2x15     Cybex Leg Press 40lb 2x10, RLE 20lb 2x5      Knee/Hip Exercises: Standing   Forward Step Up Right;Hand Hold: 0;Step Height: 6";5 reps;3 sets      Knee/Hip Exercises: Seated   Sit to Sand 3 sets;5 reps;without UE support   blue chair  Vasopneumatic   Number Minutes Vasopneumatic  10 minutes    Vasopnuematic Location  Knee    Vasopneumatic Pressure Medium    Vasopneumatic Temperature  36      Manual Therapy   Manual Therapy Passive ROM    Passive ROM R knee flex and ext with some end range stretching                    PT Short Term Goals - 05/11/20 1150      PT SHORT TERM GOAL #1   Title independent with initial HEP    Status Achieved             PT Long Term Goals - 05/18/20 1143      PT LONG TERM GOAL #1   Title Pt will achieve 110 deg of knee flexion    Status Partially Met      PT LONG TERM GOAL #2   Title Pt will demonstrate 0 deg knee extension    Status On-going                 Plan - 05/18/20 1141    Clinical Impression Statement Pt is doing  well overall, ambulates with a rigid RLE, but she can correct with cues. She is lacking ~ 6 degrees of R knee extension. She did well with  all strengthening interventions cues given for TKE with knee extension. Some compensation needed with step ups and with sit to stands from standard chair. Instructed pt bot to have anything under her knee at home when sitting at watching TV.    Personal Factors and Comorbidities Comorbidity 1    Comorbidities HTN    Examination-Activity Limitations Carry;Lift;Locomotion Level;Transfers;Sit;Stairs;Stand    Examination-Participation Restrictions Community Activity;Driving;Interpersonal Relationship    Stability/Clinical Decision Making Stable/Uncomplicated    Rehab Potential Good    PT Frequency 3x / week    PT Duration 8 weeks    PT Treatment/Interventions ADLs/Self Care Home Management;Cryotherapy;Electrical Stimulation;Iontophoresis 59m/ml Dexamethasone;Moist Heat;Traction;Ultrasound;Therapeutic activities;Gait training;Therapeutic exercise;Functional mobility training;Balance training;Neuromuscular re-education;Patient/family education;Manual techniques;Stair training;Scar mobilization;Passive range of motion;Vasopneumatic Device;Taping    PT Next Visit Plan knee ROM/flexibility/strengthening. Modalities/manual as indicated.           Patient will benefit from skilled therapeutic intervention in order to improve the following deficits and impairments:  Improper body mechanics, Pain, Increased muscle spasms, Decreased range of motion, Decreased strength, Impaired flexibility, Difficulty walking, Abnormal gait, Impaired sensation  Visit Diagnosis: Acute pain of right knee  Muscle weakness (generalized)  Localized edema  Difficulty in walking, not elsewhere classified     Problem List Patient Active Problem List   Diagnosis Date Noted  . Status post total right knee replacement 04/08/2020  . Unilateral primary osteoarthritis, right knee 03/14/2020  . Lumbar radiculopathy, right 07/15/2019  . Hyperlipidemia 11/28/2018  . TIA (transient ischemic attack) 10/21/2018  . Essential  hypertension 09/24/2018  . Chest pain at rest   . Degenerative joint disease of knee, right 05/08/2018  . Pain of left heel 02/23/2013  . Bacterial vaginosis 12/25/2011  . Allergic rhinitis 12/25/2011  . Lung nodule 08/24/2011  . Atypical chest pain 08/22/2011  . Depression 08/22/2011  . Preventative health care 08/22/2011    RScot Jun6/23/2021, 11:49 AM  [address] [place] [address]Phone: [phone]  Fax:  [phone] Name: LReniya MccleesMRN: [id number] of Birth: [birth year]

## 2020-05-19 ENCOUNTER — Encounter: Payer: Self-pay | Admitting: Orthopaedic Surgery

## 2020-05-19 ENCOUNTER — Ambulatory Visit (INDEPENDENT_AMBULATORY_CARE_PROVIDER_SITE_OTHER): Payer: BC Managed Care – PPO | Admitting: Orthopaedic Surgery

## 2020-05-19 DIAGNOSIS — Z96651 Presence of right artificial knee joint: Secondary | ICD-10-CM

## 2020-05-19 MED ORDER — METHYLPREDNISOLONE 4 MG PO TABS: ORAL_TABLET | ORAL | 0 refills | Status: DC

## 2020-05-19 MED ORDER — HYDROCODONE-ACETAMINOPHEN 5-325 MG PO TABS: 1.0000 | ORAL_TABLET | Freq: Four times a day (QID) | ORAL | 0 refills | Status: DC | PRN

## 2020-05-19 NOTE — Progress Notes (Signed)
The patient is 6 weeks status post a right total knee arthroplasty.  She is making progress with therapy.  She is still having to consider amount of pain mainly medially and down to her ankle.  On examination is no foot drop.  She has significant pain in her shin but not her calf.  Calf is soft.  She lacks full extension by only about 3 degrees and she can flex to almost 100 degrees.  This is definitely improved from her last visit.  She does work in [employer].  I have not comfortable with her returning to work yet because of medical note released her to drive and I still have her on narcotics.  She needs aggressive therapy to help increase her mobility.  I would also put her on a 6-day steroid taper to try to calm down inflammation this is experiencing.  I will send in some more hydrocodone as well.  All questions and concerns were answered and addressed.  We will see her back in about 6 weeks to see how she is doing overall but no x-rays are needed.

## 2020-05-20 ENCOUNTER — Other Ambulatory Visit: Payer: Self-pay

## 2020-05-20 ENCOUNTER — Ambulatory Visit: Payer: BC Managed Care – PPO | Admitting: Physical Therapy

## 2020-05-20 DIAGNOSIS — M25561 Pain in right knee: Secondary | ICD-10-CM | POA: Diagnosis not present

## 2020-05-20 DIAGNOSIS — R262 Difficulty in walking, not elsewhere classified: Secondary | ICD-10-CM | POA: Diagnosis not present

## 2020-05-20 DIAGNOSIS — R600 Localized edema: Secondary | ICD-10-CM

## 2020-05-20 DIAGNOSIS — M6281 Muscle weakness (generalized): Secondary | ICD-10-CM | POA: Diagnosis not present

## 2020-05-20 NOTE — Therapy (Signed)
[address] [place], [place], [address] Phone: [phone]   Fax:  [phone]  Physical Therapy Treatment  Patient Details  Name: Debra Carpenter MRN: [id number] Date of Birth: 1966/10/29 Referring Provider (PT): Jean Rosenthal   Encounter Date: 05/20/2020   PT End of Session - 05/20/20 1133    Visit Number 8    Date for PT Re-Evaluation 07/03/20    PT Start Time 1055    PT Stop Time 1150    PT Time Calculation (min) 55 min           Past Medical History:  Diagnosis Date  . Anginal pain ([place]) 9/12   states negative cardiology workup exception of murmur  . Anxiety and depression   . Bony sclerosis 08/22/2011  . GERD (gastroesophageal reflux disease)   . Heart murmur   . Hx of bronchitis   . Hypertension   . Lung nodule 08/24/2011    Past Surgical History:  Procedure Laterality Date  . ABDOMINAL HYSTERECTOMY  2007  . LAPAROSCOPIC GASTRIC BANDING  2011  . LAPAROSCOPIC GASTRIC SLEEVE RESECTION N/A 07/21/2013   Procedure: LAPAROSCOPIC GASTRIC SLEEVE RESECTION, LYSIS OF ADHESIONS, EGD;  Surgeon: Madilyn Hook, DO;  [facility];  Service: General;  Laterality: N/A;  . LAPAROSCOPIC LYSIS OF ADHESIONS  07/21/2013   Procedure: LAPAROSCOPIC LYSIS OF ADHESIONS;  Surgeon: Madilyn Hook, DO;  [facility];  Service: General;;  . LAPAROSCOPIC REPAIR AND REMOVAL OF GASTRIC BAND N/A 05/05/2013   Procedure: LAPAROSCOPIC REMOVAL OF ADJUSTABLE GASTRIC BAND AND PORT, ENDOSCOPY ;  Surgeon: Madilyn Hook, DO;  [facility];  Service: General;  Laterality: N/A;  laparoscopic removal of adjustable gastric band AND PORT   . TOTAL KNEE ARTHROPLASTY Right 04/08/2020   Procedure: RIGHT TOTAL KNEE ARTHROPLASTY;  Surgeon: Mcarthur Rossetti, MD;  [facility];  Service: Orthopedics;  Laterality: Right;  . UPPER GI ENDOSCOPY  07/21/2013   Procedure: UPPER GI ENDOSCOPY;  Surgeon: Madilyn Hook, DO;  [facility];  Service: General;;    There were no vitals filed for this visit.   Subjective Assessment - 05/20/20 1059    Subjective MD pleased- keep working on ext and flex. incsion sore today    Currently in Pain? Yes    Pain Score 5     Pain Location Knee    Pain Orientation Right                             [facility] Adult PT Treatment/Exercise - 05/20/20 0001      Lumbar Exercises: Aerobic   Recumbent Bike 6 min full rev      Knee/Hip Exercises: Seated   Long Arc Quad Strengthening;Right;10 reps;Weights   3 sec TKE hol   Long Arc Quad Weight 3 lbs.    Other Seated Knee/Hip Exercises TKE green tband hold 3 sec 2 sets 10    Hamstring Curl Strengthening;Right;10 reps   green tband 3 sec hold     Electrical Stimulation   Electrical Stimulation Location RT knee    Electrical Stimulation Action IFC    Electrical Stimulation Parameters supine    Electrical Stimulation Goals Edema;Pain      Vasopneumatic   Number Minutes Vasopneumatic  15 minutes    Vasopnuematic Location  Knee    Vasopneumatic Pressure Medium    Vasopneumatic Temperature  36      Manual Therapy  Manual Therapy Passive ROM    Manual therapy comments added belt mobs for ext   posterior tenderness and swelling   Passive ROM R knee flex and ext with some end range stretching                    PT Short Term Goals - 05/11/20 1150      PT SHORT TERM GOAL #1   Title independent with initial HEP    Status Achieved             PT Long Term Goals - 05/18/20 1143      PT LONG TERM GOAL #1   Title Pt will achieve 110 deg of knee flexion    Status Partially Met      PT LONG TERM GOAL #2   Title Pt will demonstrate 0 deg knee extension    Status On-going                 Plan - 05/20/20 1134    Clinical Impression Statement pt with increased paina dn swelling today,very MD pleased overall- put her on prednisone. increased paina nd tenderness with MT so added estim with VASO      PT Treatment/Interventions ADLs/Self Care Home Management;Cryotherapy;Electrical Stimulation;Iontophoresis 63m/ml Dexamethasone;Moist Heat;Traction;Ultrasound;Therapeutic activities;Gait training;Therapeutic exercise;Functional mobility training;Balance training;Neuromuscular re-education;Patient/family education;Manual techniques;Stair training;Scar mobilization;Passive range of motion;Vasopneumatic Device;Taping    PT Next Visit Plan assess and progress           Patient will benefit from skilled therapeutic intervention in order to improve the following deficits and impairments:  Improper body mechanics, Pain, Increased muscle spasms, Decreased range of motion, Decreased strength, Impaired flexibility, Difficulty walking, Abnormal gait, Impaired sensation  Visit Diagnosis: Muscle weakness (generalized)  Acute pain of right knee  Localized edema  Difficulty in walking, not elsewhere classified     Problem List Patient Active Problem List   Diagnosis Date Noted  . Status post total right knee replacement 04/08/2020  . Unilateral primary osteoarthritis, right knee 03/14/2020  . Lumbar radiculopathy, right 07/15/2019  . Hyperlipidemia 11/28/2018  . TIA (transient ischemic attack) 10/21/2018  . Essential hypertension 09/24/2018  . Chest pain at rest   . Degenerative joint disease of knee, right 05/08/2018  . Pain of left heel 02/23/2013  . Bacterial vaginosis 12/25/2011  . Allergic rhinitis 12/25/2011  . Lung nodule 08/24/2011  . Atypical chest pain 08/22/2011  . Depression 08/22/2011  . Preventative health care 08/22/2011    Daron Stutz,ANGIE  PTA 05/20/2020, 11:35 AM  [address] [address]Phone: [phone]  Fax:  [phone] Name: LMikeala GirdlerMRN: [id number] of Birth: 8April 11, 1967

## 2020-05-25 ENCOUNTER — Other Ambulatory Visit: Payer: Self-pay

## 2020-05-25 ENCOUNTER — Ambulatory Visit: Payer: BC Managed Care – PPO | Admitting: Physical Therapy

## 2020-05-25 ENCOUNTER — Encounter: Payer: Self-pay | Admitting: Physical Therapy

## 2020-05-25 DIAGNOSIS — M6281 Muscle weakness (generalized): Secondary | ICD-10-CM

## 2020-05-25 DIAGNOSIS — R262 Difficulty in walking, not elsewhere classified: Secondary | ICD-10-CM

## 2020-05-25 DIAGNOSIS — R600 Localized edema: Secondary | ICD-10-CM | POA: Diagnosis not present

## 2020-05-25 DIAGNOSIS — M25561 Pain in right knee: Secondary | ICD-10-CM | POA: Diagnosis not present

## 2020-05-25 NOTE — Therapy (Signed)
[address] [place], [place], [address] Phone: [phone]   Fax:  [phone]  Physical Therapy Treatment  Patient Details  Name: Debra Carpenter MRN: [id number] Date of Birth: Jul 11, 1966 Referring Provider (PT): Jean Rosenthal   Encounter Date: 05/25/2020   PT End of Session - 05/25/20 1144    Visit Number 9    Date for PT Re-Evaluation 07/03/20    PT Start Time 1100    PT Stop Time 1158    PT Time Calculation (min) 58 min    Activity Tolerance Patient tolerated treatment well    Behavior During Therapy [facility] for tasks assessed/performed           Past Medical History:  Diagnosis Date   Anginal pain ([place]) 9/12   states negative cardiology workup exception of murmur   Anxiety and depression    Bony sclerosis 08/22/2011   GERD (gastroesophageal reflux disease)    Heart murmur    Hx of bronchitis    Hypertension    Lung nodule 08/24/2011    Past Surgical History:  Procedure Laterality Date   ABDOMINAL HYSTERECTOMY  2007   LAPAROSCOPIC GASTRIC BANDING  2011   LAPAROSCOPIC GASTRIC SLEEVE RESECTION N/A 07/21/2013   Procedure: LAPAROSCOPIC GASTRIC SLEEVE RESECTION, LYSIS OF ADHESIONS, EGD;  Surgeon: Madilyn Hook, DO;  [facility];  Service: General;  Laterality: N/A;   LAPAROSCOPIC LYSIS OF ADHESIONS  07/21/2013   Procedure: LAPAROSCOPIC LYSIS OF ADHESIONS;  Surgeon: Madilyn Hook, DO;  [facility];  Service: General;;   LAPAROSCOPIC REPAIR AND REMOVAL OF GASTRIC BAND N/A 05/05/2013   Procedure: LAPAROSCOPIC REMOVAL OF ADJUSTABLE GASTRIC BAND AND PORT, ENDOSCOPY ;  Surgeon: Madilyn Hook, DO;  [facility];  Service: General;  Laterality: N/A;  laparoscopic removal of adjustable gastric band AND PORT    TOTAL KNEE ARTHROPLASTY Right 04/08/2020   Procedure: RIGHT TOTAL KNEE ARTHROPLASTY;  Surgeon: Mcarthur Rossetti, MD;  [facility];  Service: Orthopedics;  Laterality:  Right;   UPPER GI ENDOSCOPY  07/21/2013   Procedure: UPPER GI ENDOSCOPY;  Surgeon: Madilyn Hook, DO;  [facility];  Service: General;;    There were no vitals filed for this visit.   Subjective Assessment - 05/25/20 1103    Subjective Pt walked in feeling better because had just taken pain medication. She has been doing the elliptical every day 3x5 minutes. Pt reports knee feeling tight especially when walking.    Limitations Standing;Walking;House hold activities    Currently in Pain? Yes    Pain Score 2     Pain Location Knee    Pain Orientation Right                             [facility] Adult PT Treatment/Exercise - 05/25/20 0001      Ambulation/Gait   Stairs Yes    Stair Management Technique One rail Right;Alternating pattern    Number of Stairs 36    Height of Stairs 6    Gait Comments cues to matain Alt pattern      Lumbar Exercises: Aerobic   Tread Mill I7 x 5 min     Nustep L4 x5 min LE only       Knee/Hip Exercises: Machines for Strengthening   Cybex Knee Extension RLE 5l2 2x10     Cybex Knee Flexion RLE 15lb 2x15     Cybex Leg Press RLE  30lb x10, x5        Vasopneumatic   Number Minutes Vasopneumatic  15 minutes    Vasopnuematic Location  Knee    Vasopneumatic Pressure Low    Vasopneumatic Temperature  36      Manual Therapy   Manual Therapy Passive ROM    Passive ROM R knee flex and ext with some end range stretching          Resisted gait 40lb forward and back x3 each          PT Short Term Goals - 05/11/20 1150      PT SHORT TERM GOAL #1   Title independent with initial HEP    Status Achieved             PT Long Term Goals - 05/18/20 1143      PT LONG TERM GOAL #1   Title Pt will achieve 110 deg of knee flexion    Status Partially Met      PT LONG TERM GOAL #2   Title Pt will demonstrate 0 deg knee extension    Status On-going                 Plan - 05/25/20 1145    Clinical Impression Statement  Pt did well overall and progressed to some stair negotiation. Cues needed to maintain alt gait pattern with stairs. some weakness and fatigue noted today with SL activities. Good stability with resisted gait. Some pain at the end range of flexion and extension.    Personal Factors and Comorbidities Comorbidity 1    Examination-Activity Limitations Carry;Lift;Locomotion Level;Transfers;Sit;Stairs;Stand    Examination-Participation Restrictions Community Activity;Driving;Interpersonal Relationship    Stability/Clinical Decision Making Stable/Uncomplicated    Rehab Potential Good    PT Frequency 3x / week    PT Duration 8 weeks    PT Treatment/Interventions ADLs/Self Care Home Management;Cryotherapy;Electrical Stimulation;Iontophoresis 20m/ml Dexamethasone;Moist Heat;Traction;Ultrasound;Therapeutic activities;Gait training;Therapeutic exercise;Functional mobility training;Balance training;Neuromuscular re-education;Patient/family education;Manual techniques;Stair training;Scar mobilization;Passive range of motion;Vasopneumatic Device;Taping    PT Next Visit Plan assess and progress           Patient will benefit from skilled therapeutic intervention in order to improve the following deficits and impairments:  Improper body mechanics, Pain, Increased muscle spasms, Decreased range of motion, Decreased strength, Impaired flexibility, Difficulty walking, Abnormal gait, Impaired sensation  Visit Diagnosis: Localized edema  Acute pain of right knee  Muscle weakness (generalized)  Difficulty in walking, not elsewhere classified     Problem List Patient Active Problem List   Diagnosis Date Noted   Status post total right knee replacement 04/08/2020   Unilateral primary osteoarthritis, right knee 03/14/2020   Lumbar radiculopathy, right 07/15/2019   Hyperlipidemia 11/28/2018   TIA (transient ischemic attack) 10/21/2018   Essential hypertension 09/24/2018   Chest pain at rest     Degenerative joint disease of knee, right 05/08/2018   Pain of left heel 02/23/2013   Bacterial vaginosis 12/25/2011   Allergic rhinitis 12/25/2011   Lung nodule 08/24/2011   Atypical chest pain 08/22/2011   Depression 08/22/2011   Preventative health care 08/22/2011    RScot Jun PTA 05/25/2020, 11:46 AM  [address] [address]Phone: [phone]  Fax:  [phone] Name: Debra AvinoMRN: [id number] of Birth: [birth year]

## 2020-05-27 ENCOUNTER — Telehealth: Payer: Self-pay | Admitting: Orthopaedic Surgery

## 2020-05-27 NOTE — Telephone Encounter (Signed)
Form received from [employer]. Sent to [employer]. 

## 2020-06-07 ENCOUNTER — Other Ambulatory Visit: Payer: Self-pay

## 2020-06-07 ENCOUNTER — Encounter: Payer: Self-pay | Admitting: Physical Therapy

## 2020-06-07 ENCOUNTER — Ambulatory Visit: Payer: BC Managed Care – PPO | Attending: Orthopaedic Surgery | Admitting: Physical Therapy

## 2020-06-07 DIAGNOSIS — R262 Difficulty in walking, not elsewhere classified: Secondary | ICD-10-CM | POA: Insufficient documentation

## 2020-06-07 DIAGNOSIS — R600 Localized edema: Secondary | ICD-10-CM | POA: Insufficient documentation

## 2020-06-07 DIAGNOSIS — M6281 Muscle weakness (generalized): Secondary | ICD-10-CM | POA: Insufficient documentation

## 2020-06-07 DIAGNOSIS — M25561 Pain in right knee: Secondary | ICD-10-CM | POA: Diagnosis not present

## 2020-06-07 NOTE — Therapy (Signed)
[address], [place], [address] Phone: [phone]   Fax:  [phone]  Physical Therapy Treatment  Patient Details  Name: Debra Carpenter MRN: [id number] Date of Birth: 08-12-1966 Referring Provider (PT): Jean Rosenthal   Encounter Date: 06/07/2020   PT End of Session - 06/07/20 1056    Visit Number 10    Date for PT Re-Evaluation 07/03/20    PT Start Time 1025    PT Stop Time 1059    PT Time Calculation (min) 34 min    Activity Tolerance Patient tolerated treatment well    Behavior During Therapy [facility] for tasks assessed/performed           Past Medical History:  Diagnosis Date  . Anginal pain ([place]) 9/12   states negative cardiology workup exception of murmur  . Anxiety and depression   . Bony sclerosis 08/22/2011  . GERD (gastroesophageal reflux disease)   . Heart murmur   . Hx of bronchitis   . Hypertension   . Lung nodule 08/24/2011    Past Surgical History:  Procedure Laterality Date  . ABDOMINAL HYSTERECTOMY  2007  . LAPAROSCOPIC GASTRIC BANDING  2011  . LAPAROSCOPIC GASTRIC SLEEVE RESECTION N/A 07/21/2013   Procedure: LAPAROSCOPIC GASTRIC SLEEVE RESECTION, LYSIS OF ADHESIONS, EGD;  Surgeon: Madilyn Hook, DO;  [facility];  Service: General;  Laterality: N/A;  . LAPAROSCOPIC LYSIS OF ADHESIONS  07/21/2013   Procedure: LAPAROSCOPIC LYSIS OF ADHESIONS;  Surgeon: Madilyn Hook, DO;  [facility];  Service: General;;  . LAPAROSCOPIC REPAIR AND REMOVAL OF GASTRIC BAND N/A 05/05/2013   Procedure: LAPAROSCOPIC REMOVAL OF ADJUSTABLE GASTRIC BAND AND PORT, ENDOSCOPY ;  Surgeon: Madilyn Hook, DO;  [facility];  Service: General;  Laterality: N/A;  laparoscopic removal of adjustable gastric band AND PORT   . TOTAL KNEE ARTHROPLASTY Right 04/08/2020   Procedure: RIGHT TOTAL KNEE ARTHROPLASTY;  Surgeon: Mcarthur Rossetti, MD;  [facility];  Service: Orthopedics;   Laterality: Right;  . UPPER GI ENDOSCOPY  07/21/2013   Procedure: UPPER GI ENDOSCOPY;  Surgeon: Madilyn Hook, DO;  [facility];  Service: General;;    There were no vitals filed for this visit.   Subjective Assessment - 06/07/20 1023    Subjective Doing ok still has some tightness in her knee    Currently in Pain? No/denies              [facility] PT Assessment - 06/07/20 0001      AROM   Right Knee Extension 2    Right Knee Flexion 112                         [facility] Adult PT Treatment/Exercise - 06/07/20 0001      Knee/Hip Exercises: Aerobic   Elliptical I9 R5 x 5      Knee/Hip Exercises: Machines for Strengthening   Cybex Knee Extension RLE 5lb 2x15    Cybex Knee Flexion RLE 15lb 2x15     Cybex Leg Press 40lb 2x10, RLE 30lb x10      Knee/Hip Exercises: Standing   Heel Raises Both;2 sets;10 reps;2 seconds    Forward Step Up Right;Hand Hold: 0;Step Height: 6";2 sets;10 reps    Walking with Sports Cord 40lb 4 way x3 each                     PT Short Term Goals -  05/11/20 1150      PT SHORT TERM GOAL #1   Title independent with initial HEP    Status Achieved             PT Long Term Goals - 05/18/20 1143      PT LONG TERM GOAL #1   Title Pt will achieve 110 deg of knee flexion    Status Partially Met      PT LONG TERM GOAL #2   Title Pt will demonstrate 0 deg knee extension    Status On-going                 Plan - 06/07/20 1057    Clinical Impression Statement 7 minutes late. Pt has progressed increasing her R knee AROM. Some compensation needed with step ups. Some struggle today with single leg on leg press. Cues needed to increase step length with LLE doing resisted gait backwards walking.    Personal Factors and Comorbidities Comorbidity 1    Comorbidities HTN    Examination-Activity Limitations Carry;Lift;Locomotion Level;Transfers;Sit;Stairs;Stand    Examination-Participation Restrictions Community  Activity;Driving;Interpersonal Relationship    Stability/Clinical Decision Making Stable/Uncomplicated    Rehab Potential Good    PT Frequency 3x / week    PT Duration 8 weeks    PT Treatment/Interventions ADLs/Self Care Home Management;Cryotherapy;Electrical Stimulation;Iontophoresis 66m/ml Dexamethasone;Moist Heat;Traction;Ultrasound;Therapeutic activities;Gait training;Therapeutic exercise;Functional mobility training;Balance training;Neuromuscular re-education;Patient/family education;Manual techniques;Stair training;Scar mobilization;Passive range of motion;Vasopneumatic Device;Taping    PT Next Visit Plan assess and progress           Patient will benefit from skilled therapeutic intervention in order to improve the following deficits and impairments:  Improper body mechanics, Pain, Increased muscle spasms, Decreased range of motion, Decreased strength, Impaired flexibility, Difficulty walking, Abnormal gait, Impaired sensation  Visit Diagnosis: No diagnosis found.     Problem List Patient Active Problem List   Diagnosis Date Noted  . Status post total right knee replacement 04/08/2020  . Unilateral primary osteoarthritis, right knee 03/14/2020  . Lumbar radiculopathy, right 07/15/2019  . Hyperlipidemia 11/28/2018  . TIA (transient ischemic attack) 10/21/2018  . Essential hypertension 09/24/2018  . Chest pain at rest   . Degenerative joint disease of knee, right 05/08/2018  . Pain of left heel 02/23/2013  . Bacterial vaginosis 12/25/2011  . Allergic rhinitis 12/25/2011  . Lung nodule 08/24/2011  . Atypical chest pain 08/22/2011  . Depression 08/22/2011  . Preventative health care 08/22/2011    RScot Jun PTA 06/07/2020, 10:59 AM  [address] [place] [address]Phone: [phone]  Fax:  [phone] Name: Debra MccoyMRN: [id number] of Birth: 810/21/1967

## 2020-06-09 ENCOUNTER — Ambulatory Visit: Payer: BC Managed Care – PPO | Admitting: Physical Therapy

## 2020-06-14 ENCOUNTER — Other Ambulatory Visit: Payer: Self-pay

## 2020-06-14 ENCOUNTER — Encounter: Payer: Self-pay | Admitting: Physical Therapy

## 2020-06-14 ENCOUNTER — Ambulatory Visit: Payer: BC Managed Care – PPO | Admitting: Physical Therapy

## 2020-06-14 DIAGNOSIS — M25561 Pain in right knee: Secondary | ICD-10-CM | POA: Diagnosis not present

## 2020-06-14 DIAGNOSIS — R262 Difficulty in walking, not elsewhere classified: Secondary | ICD-10-CM | POA: Diagnosis not present

## 2020-06-14 DIAGNOSIS — R600 Localized edema: Secondary | ICD-10-CM

## 2020-06-14 DIAGNOSIS — M6281 Muscle weakness (generalized): Secondary | ICD-10-CM | POA: Diagnosis not present

## 2020-06-14 NOTE — Therapy (Signed)
[place], [place], [address] Phone: [phone]   Fax:  [phone]  Physical Therapy Treatment  Patient Details  Name: Debra Carpenter MRN: [id number] Date of Birth: Jul 01, 1966 Referring Provider (PT): Jean Rosenthal   Encounter Date: 06/14/2020   PT End of Session - 06/14/20 1142    Visit Number 11    Date for PT Re-Evaluation 07/03/20    PT Start Time 1114    PT Stop Time 1142    PT Time Calculation (min) 28 min    Activity Tolerance Patient tolerated treatment well    Behavior During Therapy [facility] for tasks assessed/performed           Past Medical History:  Diagnosis Date  . Anginal pain ([place]) 9/12   states negative cardiology workup exception of murmur  . Anxiety and depression   . Bony sclerosis 08/22/2011  . GERD (gastroesophageal reflux disease)   . Heart murmur   . Hx of bronchitis   . Hypertension   . Lung nodule 08/24/2011    Past Surgical History:  Procedure Laterality Date  . ABDOMINAL HYSTERECTOMY  2007  . LAPAROSCOPIC GASTRIC BANDING  2011  . LAPAROSCOPIC GASTRIC SLEEVE RESECTION N/A 07/21/2013   Procedure: LAPAROSCOPIC GASTRIC SLEEVE RESECTION, LYSIS OF ADHESIONS, EGD;  Surgeon: Madilyn Hook, DO;  [facility];  Service: General;  Laterality: N/A;  . LAPAROSCOPIC LYSIS OF ADHESIONS  07/21/2013   Procedure: LAPAROSCOPIC LYSIS OF ADHESIONS;  Surgeon: Madilyn Hook, DO;  [facility];  Service: General;;  . LAPAROSCOPIC REPAIR AND REMOVAL OF GASTRIC BAND N/A 05/05/2013   Procedure: LAPAROSCOPIC REMOVAL OF ADJUSTABLE GASTRIC BAND AND PORT, ENDOSCOPY ;  Surgeon: Madilyn Hook, DO;  [facility];  Service: General;  Laterality: N/A;  laparoscopic removal of adjustable gastric band AND PORT   . TOTAL KNEE ARTHROPLASTY Right 04/08/2020   Procedure: RIGHT TOTAL KNEE ARTHROPLASTY;  Surgeon: Mcarthur Rossetti, MD;  [facility];  Service: Orthopedics;   Laterality: Right;  . UPPER GI ENDOSCOPY  07/21/2013   Procedure: UPPER GI ENDOSCOPY;  Surgeon: Madilyn Hook, DO;  [facility];  Service: General;;    There were no vitals filed for this visit.   Subjective Assessment - 06/14/20 1116    Subjective Doing ok, some sharp pain in the R knee at times    Currently in Pain? Yes    Pain Score 3     Pain Location Knee    Pain Orientation Right                             [facility] Adult PT Treatment/Exercise - 06/14/20 0001      Ambulation/Gait   Stairs Yes    Stair Management Technique One rail Right;Alternating pattern    Number of Stairs 24    Height of Stairs 6    Gait Comments R knee pain ascending atairs       Lumbar Exercises: Aerobic   Recumbent Bike L2 x 5 min       Knee/Hip Exercises: Machines for Strengthening   Cybex Knee Extension RLE 5lb 2x15    Cybex Knee Flexion RLE 20lb 2x10     Cybex Leg Press 20lb RLE 2x10       Knee/Hip Exercises: Standing   Lateral Step Up Both;1 set;10 reps;Hand Hold: 0;Step Height: 6"      Knee/Hip Exercises: Seated   Sit to  Sand 1 set;10 reps;without UE support                    PT Short Term Goals - 05/11/20 1150      PT SHORT TERM GOAL #1   Title independent with initial HEP    Status Achieved             PT Long Term Goals - 05/18/20 1143      PT LONG TERM GOAL #1   Title Pt will achieve 110 deg of knee flexion    Status Partially Met      PT LONG TERM GOAL #2   Title Pt will demonstrate 0 deg knee extension    Status On-going                 Plan - 06/14/20 1143    Clinical Impression Statement 14 minutes late for today's session. P R knee pain today when ascending stairs, she also has some eccentric weakness descending stairs. Compensation noted with lateral step ups with RLE.    Personal Factors and Comorbidities Comorbidity 1    Comorbidities HTN    Examination-Activity Limitations Carry;Lift;Locomotion  Level;Transfers;Sit;Stairs;Stand    Examination-Participation Restrictions Community Activity;Driving;Interpersonal Relationship    Stability/Clinical Decision Making Stable/Uncomplicated    Rehab Potential Good    PT Frequency 3x / week    PT Treatment/Interventions ADLs/Self Care Home Management;Cryotherapy;Electrical Stimulation;Iontophoresis 77m/ml Dexamethasone;Moist Heat;Traction;Ultrasound;Therapeutic activities;Gait training;Therapeutic exercise;Functional mobility training;Balance training;Neuromuscular re-education;Patient/family education;Manual techniques;Stair training;Scar mobilization;Passive range of motion;Vasopneumatic Device;Taping    PT Next Visit Plan assess and progress           Patient will benefit from skilled therapeutic intervention in order to improve the following deficits and impairments:  Improper body mechanics, Pain, Increased muscle spasms, Decreased range of motion, Decreased strength, Impaired flexibility, Difficulty walking, Abnormal gait, Impaired sensation  Visit Diagnosis: Localized edema  Muscle weakness (generalized)     Problem List Patient Active Problem List   Diagnosis Date Noted  . Status post total right knee replacement 04/08/2020  . Unilateral primary osteoarthritis, right knee 03/14/2020  . Lumbar radiculopathy, right 07/15/2019  . Hyperlipidemia 11/28/2018  . TIA (transient ischemic attack) 10/21/2018  . Essential hypertension 09/24/2018  . Chest pain at rest   . Degenerative joint disease of knee, right 05/08/2018  . Pain of left heel 02/23/2013  . Bacterial vaginosis 12/25/2011  . Allergic rhinitis 12/25/2011  . Lung nodule 08/24/2011  . Atypical chest pain 08/22/2011  . Depression 08/22/2011  . Preventative health care 08/22/2011    RScot Jun PTA 06/14/2020, 11:46 AM  [address] [address]Phone: [phone]  Fax:   [phone] Name: Debra AileyMRN: [id number] of Birth: 8July 23, 1967

## 2020-06-15 ENCOUNTER — Other Ambulatory Visit: Payer: Self-pay | Admitting: Surgical

## 2020-06-15 ENCOUNTER — Other Ambulatory Visit: Payer: Self-pay | Admitting: Orthopaedic Surgery

## 2020-06-15 MED ORDER — HYDROCODONE-ACETAMINOPHEN 5-325 MG PO TABS: 1.0000 | ORAL_TABLET | Freq: Four times a day (QID) | ORAL | 0 refills | Status: DC | PRN

## 2020-06-15 NOTE — Telephone Encounter (Signed)
Pls advise. Thanks.  

## 2020-06-15 NOTE — Telephone Encounter (Signed)
Please advise 

## 2020-06-16 ENCOUNTER — Encounter: Payer: Self-pay | Admitting: Physical Therapy

## 2020-06-16 ENCOUNTER — Other Ambulatory Visit: Payer: Self-pay

## 2020-06-16 ENCOUNTER — Ambulatory Visit: Payer: BC Managed Care – PPO | Admitting: Physical Therapy

## 2020-06-16 DIAGNOSIS — R600 Localized edema: Secondary | ICD-10-CM | POA: Diagnosis not present

## 2020-06-16 DIAGNOSIS — M25561 Pain in right knee: Secondary | ICD-10-CM | POA: Diagnosis not present

## 2020-06-16 DIAGNOSIS — R262 Difficulty in walking, not elsewhere classified: Secondary | ICD-10-CM | POA: Diagnosis not present

## 2020-06-16 DIAGNOSIS — M6281 Muscle weakness (generalized): Secondary | ICD-10-CM

## 2020-06-16 NOTE — Therapy (Signed)
[place], [place], [address] Phone: [phone]   Fax:  [phone]  Physical Therapy Treatment  Patient Details  Name: Debra Carpenter MRN: [id number] Date of Birth: 1966-09-10 Referring Provider (PT): Jean Rosenthal   Encounter Date: 06/16/2020   PT End of Session - 06/16/20 1146    Visit Number 12    Date for PT Re-Evaluation 07/03/20    PT Start Time 1100    PT Stop Time 1145    PT Time Calculation (min) 45 min    Activity Tolerance Patient tolerated treatment well    Behavior During Therapy [facility] for tasks assessed/performed           Past Medical History:  Diagnosis Date  . Anginal pain ([place]) 9/12   states negative cardiology workup exception of murmur  . Anxiety and depression   . Bony sclerosis 08/22/2011  . GERD (gastroesophageal reflux disease)   . Heart murmur   . Hx of bronchitis   . Hypertension   . Lung nodule 08/24/2011    Past Surgical History:  Procedure Laterality Date  . ABDOMINAL HYSTERECTOMY  2007  . LAPAROSCOPIC GASTRIC BANDING  2011  . LAPAROSCOPIC GASTRIC SLEEVE RESECTION N/A 07/21/2013   Procedure: LAPAROSCOPIC GASTRIC SLEEVE RESECTION, LYSIS OF ADHESIONS, EGD;  Surgeon: Madilyn Hook, DO;  Location: [facility];  Service: General;  Laterality: N/A;  . LAPAROSCOPIC LYSIS OF ADHESIONS  07/21/2013   Procedure: LAPAROSCOPIC LYSIS OF ADHESIONS;  Surgeon: Madilyn Hook, DO;  Location: [facility];  Service: General;;  . LAPAROSCOPIC REPAIR AND REMOVAL OF GASTRIC BAND N/A 05/05/2013   Procedure: LAPAROSCOPIC REMOVAL OF ADJUSTABLE GASTRIC BAND AND PORT, ENDOSCOPY ;  Surgeon: Madilyn Hook, DO;  [facility];  Service: General;  Laterality: N/A;  laparoscopic removal of adjustable gastric band AND PORT   . TOTAL KNEE ARTHROPLASTY Right 04/08/2020   Procedure: RIGHT TOTAL KNEE ARTHROPLASTY;  Surgeon: Mcarthur Rossetti, MD;  [facility];  Service: Orthopedics;   Laterality: Right;  . UPPER GI ENDOSCOPY  07/21/2013   Procedure: UPPER GI ENDOSCOPY;  Surgeon: Madilyn Hook, DO;  [facility];  Service: General;;    There were no vitals filed for this visit.   Subjective Assessment - 06/16/20 1105    Subjective Doing pretty good just tired    Currently in Pain? No/denies                             [facility] Adult PT Treatment/Exercise - 06/16/20 0001      Lumbar Exercises: Aerobic   Tread Mill I9 R7 x2 min each       Knee/Hip Exercises: Machines for Strengthening   Cybex Knee Extension RLE 10lb 2x10     Cybex Knee Flexion RLE 20lb 2x10     Cybex Leg Press 20lb RLE 2x5      Knee/Hip Exercises: Standing   Heel Raises Both;2 sets;10 reps;2 seconds    Lateral Step Up Both;1 set;Hand Hold: 0;Step Height: 6";5 reps    Forward Step Up Right;Hand Hold: 0;Step Height: 6";2 sets;10 reps    Walking with Sports Cord 40lb 4 way x4 each       Knee/Hip Exercises: Seated   Sit to Sand 2 sets;10 reps;without UE support                    PT Short Term Goals - 05/11/20 1150  PT SHORT TERM GOAL #1   Title independent with initial HEP    Status Achieved             PT Long Term Goals - 05/18/20 1143      PT LONG TERM GOAL #1   Title Pt will achieve 110 deg of knee flexion    Status Partially Met      PT LONG TERM GOAL #2   Title Pt will demonstrate 0 deg knee extension    Status On-going                 Plan - 06/16/20 1147    Clinical Impression Statement Pt did well completing all exercise interventions. increase reps tolerated with SL on leg press. Increase resistance tolerated with SL extensions. Some pain reported in both LE with lateral step ups. Attempted * in forwards step up but with increase compensation so 6 in was selected.    Examination-Activity Limitations Carry;Lift;Locomotion Level;Transfers;Sit;Stairs;Stand    Examination-Participation Restrictions Community  Activity;Driving;Interpersonal Relationship    Stability/Clinical Decision Making Stable/Uncomplicated    Rehab Potential Good    PT Frequency 3x / week    PT Duration 8 weeks    PT Treatment/Interventions ADLs/Self Care Home Management;Cryotherapy;Electrical Stimulation;Iontophoresis 60m/ml Dexamethasone;Moist Heat;Traction;Ultrasound;Therapeutic activities;Gait training;Therapeutic exercise;Functional mobility training;Balance training;Neuromuscular re-education;Patient/family education;Manual techniques;Stair training;Scar mobilization;Passive range of motion;Vasopneumatic Device;Taping    PT Next Visit Plan assess and progress           Patient will benefit from skilled therapeutic intervention in order to improve the following deficits and impairments:  Improper body mechanics, Pain, Increased muscle spasms, Decreased range of motion, Decreased strength, Impaired flexibility, Difficulty walking, Abnormal gait, Impaired sensation  Visit Diagnosis: Localized edema  Muscle weakness (generalized)  Acute pain of right knee  Difficulty in walking, not elsewhere classified     Problem List Patient Active Problem List   Diagnosis Date Noted  . Status post total right knee replacement 04/08/2020  . Unilateral primary osteoarthritis, right knee 03/14/2020  . Lumbar radiculopathy, right 07/15/2019  . Hyperlipidemia 11/28/2018  . TIA (transient ischemic attack) 10/21/2018  . Essential hypertension 09/24/2018  . Chest pain at rest   . Degenerative joint disease of knee, right 05/08/2018  . Pain of left heel 02/23/2013  . Bacterial vaginosis 12/25/2011  . Allergic rhinitis 12/25/2011  . Lung nodule 08/24/2011  . Atypical chest pain 08/22/2011  . Depression 08/22/2011  . Preventative health care 08/22/2011    RScot Jun PTA 06/16/2020, 11:49 AM  [address] [address]Phone:  [phone]  Fax:  [phone] Name: Debra KelsoMRN: [id number] of Birth: 811-05-1966

## 2020-06-21 ENCOUNTER — Encounter: Payer: Self-pay | Admitting: Physical Therapy

## 2020-06-21 ENCOUNTER — Ambulatory Visit: Payer: BC Managed Care – PPO | Admitting: Physical Therapy

## 2020-06-21 ENCOUNTER — Other Ambulatory Visit: Payer: Self-pay

## 2020-06-21 DIAGNOSIS — M25561 Pain in right knee: Secondary | ICD-10-CM

## 2020-06-21 DIAGNOSIS — M6281 Muscle weakness (generalized): Secondary | ICD-10-CM

## 2020-06-21 DIAGNOSIS — R600 Localized edema: Secondary | ICD-10-CM | POA: Diagnosis not present

## 2020-06-21 DIAGNOSIS — R262 Difficulty in walking, not elsewhere classified: Secondary | ICD-10-CM | POA: Diagnosis not present

## 2020-06-21 NOTE — Therapy (Signed)
[place] [address] [place], [place], [address] Phone: [phone]   Fax:  [phone]  Physical Therapy Treatment  Patient Details  Name: Debra Carpenter MRN: [id number] Date of Birth: Feb 12, 1966 Referring Provider (PT): Jean Rosenthal   Encounter Date: 06/21/2020   PT End of Session - 06/21/20 0840    Visit Number 13    Date for PT Re-Evaluation 07/03/20    PT Start Time 0800    PT Stop Time 0843    PT Time Calculation (min) 43 min    Activity Tolerance Patient tolerated treatment well    Behavior During Therapy [facility] for tasks assessed/performed           Past Medical History:  Diagnosis Date  . Anginal pain ([place]) 9/12   states negative cardiology workup exception of murmur  . Anxiety and depression   . Bony sclerosis 08/22/2011  . GERD (gastroesophageal reflux disease)   . Heart murmur   . Hx of bronchitis   . Hypertension   . Lung nodule 08/24/2011    Past Surgical History:  Procedure Laterality Date  . ABDOMINAL HYSTERECTOMY  2007  . LAPAROSCOPIC GASTRIC BANDING  2011  . LAPAROSCOPIC GASTRIC SLEEVE RESECTION N/A 07/21/2013   Procedure: LAPAROSCOPIC GASTRIC SLEEVE RESECTION, LYSIS OF ADHESIONS, EGD;  Surgeon: Madilyn Hook, DO;  [facility];  Service: General;  Laterality: N/A;  . LAPAROSCOPIC LYSIS OF ADHESIONS  07/21/2013   Procedure: LAPAROSCOPIC LYSIS OF ADHESIONS;  Surgeon: Madilyn Hook, DO;  [facility];  Service: General;;  . LAPAROSCOPIC REPAIR AND REMOVAL OF GASTRIC BAND N/A 05/05/2013   Procedure: LAPAROSCOPIC REMOVAL OF ADJUSTABLE GASTRIC BAND AND PORT, ENDOSCOPY ;  Surgeon: Madilyn Hook, DO;  [facility];  Service: General;  Laterality: N/A;  laparoscopic removal of adjustable gastric band AND PORT   . TOTAL KNEE ARTHROPLASTY Right 04/08/2020   Procedure: RIGHT TOTAL KNEE ARTHROPLASTY;  Surgeon: Mcarthur Rossetti, MD;  [facility];  Service: Orthopedics;   Laterality: Right;  . UPPER GI ENDOSCOPY  07/21/2013   Procedure: UPPER GI ENDOSCOPY;  Surgeon: Madilyn Hook, DO;  [facility];  Service: General;;    There were no vitals filed for this visit.   Subjective Assessment - 06/21/20 0803    Subjective "Ok"    Currently in Pain? No/denies                             [facility] Adult PT Treatment/Exercise - 06/21/20 0001      Ambulation/Gait   Stairs Yes    Stairs Assistance 6: Modified independent (Device/Increase time)    Stair Management Technique No rails;One rail Right;Alternating pattern    Number of Stairs 48    Height of Stairs 6    Gait Comments RLE weakness noted      Lumbar Exercises: Aerobic   Tread Mill I10 R5 x3 min     Nustep L4 x5 min LE only       Knee/Hip Exercises: Machines for Strengthening   Cybex Knee Extension 15lb 2x15     Cybex Knee Flexion 35lb 2x15    Cybex Leg Press 40lb 2x15      Knee/Hip Exercises: Standing   Heel Raises Both;2 sets;10 reps;2 seconds      Knee/Hip Exercises: Seated   Sit to Sand 2 sets;10 reps;without UE support  PT Short Term Goals - 05/11/20 1150      PT SHORT TERM GOAL #1   Title independent with initial HEP    Status Achieved             PT Long Term Goals - 05/18/20 1143      PT LONG TERM GOAL #1   Title Pt will achieve 110 deg of knee flexion    Status Partially Met      PT LONG TERM GOAL #2   Title Pt will demonstrate 0 deg knee extension    Status On-going                 Plan - 06/21/20 0840    Clinical Impression Statement Pt able to progress to stair negotiation without rail maintaining a alternating pattern. Some R LE weakness noted with stair negotiation. Some compensation noted with lateral step ups. No issues with machine level interventions.    Personal Factors and Comorbidities Comorbidity 1    Comorbidities HTN    Examination-Activity Limitations Carry;Lift;Locomotion  Level;Transfers;Sit;Stairs;Stand    Examination-Participation Restrictions Community Activity;Driving;Interpersonal Relationship    Stability/Clinical Decision Making Stable/Uncomplicated    Rehab Potential Good    PT Frequency 3x / week    PT Duration 8 weeks    PT Treatment/Interventions ADLs/Self Care Home Management;Cryotherapy;Electrical Stimulation;Iontophoresis 25m/ml Dexamethasone;Moist Heat;Traction;Ultrasound;Therapeutic activities;Gait training;Therapeutic exercise;Functional mobility training;Balance training;Neuromuscular re-education;Patient/family education;Manual techniques;Stair training;Scar mobilization;Passive range of motion;Vasopneumatic Device;Taping    PT Next Visit Plan assess and progress           Patient will benefit from skilled therapeutic intervention in order to improve the following deficits and impairments:  Improper body mechanics, Pain, Increased muscle spasms, Decreased range of motion, Decreased strength, Impaired flexibility, Difficulty walking, Abnormal gait, Impaired sensation  Visit Diagnosis: Muscle weakness (generalized)  Acute pain of right knee  Localized edema     Problem List Patient Active Problem List   Diagnosis Date Noted  . Status post total right knee replacement 04/08/2020  . Unilateral primary osteoarthritis, right knee 03/14/2020  . Lumbar radiculopathy, right 07/15/2019  . Hyperlipidemia 11/28/2018  . TIA (transient ischemic attack) 10/21/2018  . Essential hypertension 09/24/2018  . Chest pain at rest   . Degenerative joint disease of knee, right 05/08/2018  . Pain of left heel 02/23/2013  . Bacterial vaginosis 12/25/2011  . Allergic rhinitis 12/25/2011  . Lung nodule 08/24/2011  . Atypical chest pain 08/22/2011  . Depression 08/22/2011  . Preventative health care 08/22/2011    RScot Jun PTA 06/21/2020, 8:44 AM  [place] [address] [address]Phone: [phone]  Fax:  [phone] Name: LCayman BrogdenMRN: [id number] of Birth: 802/10/1966

## 2020-06-23 ENCOUNTER — Other Ambulatory Visit: Payer: Self-pay

## 2020-06-23 ENCOUNTER — Ambulatory Visit: Payer: BC Managed Care – PPO | Admitting: Physical Therapy

## 2020-06-23 ENCOUNTER — Encounter: Payer: Self-pay | Admitting: Physical Therapy

## 2020-06-23 DIAGNOSIS — M6281 Muscle weakness (generalized): Secondary | ICD-10-CM | POA: Diagnosis not present

## 2020-06-23 DIAGNOSIS — M25561 Pain in right knee: Secondary | ICD-10-CM | POA: Diagnosis not present

## 2020-06-23 DIAGNOSIS — R600 Localized edema: Secondary | ICD-10-CM

## 2020-06-23 DIAGNOSIS — R262 Difficulty in walking, not elsewhere classified: Secondary | ICD-10-CM | POA: Diagnosis not present

## 2020-06-23 NOTE — Therapy (Signed)
[address] [address] [place], [place], [address] Phone: [phone]   Fax:  [phone]  Physical Therapy Treatment  Patient Details  Name: Debra Carpenter MRN: [id number] Date of Birth: 11-Jan-1966 Referring Provider (PT): Jean Rosenthal   Encounter Date: 06/23/2020   PT End of Session - 06/23/20 0927    Visit Number 14    Date for PT Re-Evaluation 07/03/20    PT Start Time 0847    PT Stop Time 0929    PT Time Calculation (min) 42 min    Activity Tolerance Patient tolerated treatment well    Behavior During Therapy [facility] for tasks assessed/performed           Past Medical History:  Diagnosis Date  . Anginal pain ([place]) 9/12   states negative cardiology workup exception of murmur  . Anxiety and depression   . Bony sclerosis 08/22/2011  . GERD (gastroesophageal reflux disease)   . Heart murmur   . Hx of bronchitis   . Hypertension   . Lung nodule 08/24/2011    Past Surgical History:  Procedure Laterality Date  . ABDOMINAL HYSTERECTOMY  2007  . LAPAROSCOPIC GASTRIC BANDING  2011  . LAPAROSCOPIC GASTRIC SLEEVE RESECTION N/A 07/21/2013   Procedure: LAPAROSCOPIC GASTRIC SLEEVE RESECTION, LYSIS OF ADHESIONS, EGD;  Surgeon: Madilyn Hook, DO;  [facility];  Service: General;  Laterality: N/A;  . LAPAROSCOPIC LYSIS OF ADHESIONS  07/21/2013   Procedure: LAPAROSCOPIC LYSIS OF ADHESIONS;  Surgeon: Madilyn Hook, DO;  [facility];  Service: General;;  . LAPAROSCOPIC REPAIR AND REMOVAL OF GASTRIC BAND N/A 05/05/2013   Procedure: LAPAROSCOPIC REMOVAL OF ADJUSTABLE GASTRIC BAND AND PORT, ENDOSCOPY ;  Surgeon: Madilyn Hook, DO;  [facility];  Service: General;  Laterality: N/A;  laparoscopic removal of adjustable gastric band AND PORT   . TOTAL KNEE ARTHROPLASTY Right 04/08/2020   Procedure: RIGHT TOTAL KNEE ARTHROPLASTY;  Surgeon: Mcarthur Rossetti, MD;  [facility];  Service: Orthopedics;   Laterality: Right;  . UPPER GI ENDOSCOPY  07/21/2013   Procedure: UPPER GI ENDOSCOPY;  Surgeon: Madilyn Hook, DO;  [facility];  Service: General;;    There were no vitals filed for this visit.   Subjective Assessment - 06/23/20 0851    Subjective "It is going ok, no pain just tight"                             [facility] Adult PT Treatment/Exercise - 06/23/20 0001      Ambulation/Gait   Stairs Yes    Stairs Assistance 6: Modified independent (Device/Increase time)    Stair Management Technique No rails;One rail Right;Alternating pattern    Number of Stairs 36    Height of Stairs 6    Gait Comments RLE weakness noted, One flight back down outside up hill       Knee/Hip Exercises: Aerobic   Elliptical I8 R4 x2mn each way      Knee/Hip Exercises: Machines for Strengthening   Cybex Knee Extension RLE 3x10     Cybex Knee Flexion RLE 20lb 2x10      Knee/Hip Exercises: Standing   Lateral Step Up Both;1 set;Hand Hold: 0;Step Height: 6";5 reps    Forward Step Up Right;Hand Hold: 0;Step Height: 6";10 reps;Both;1 set      Knee/Hip Exercises: Seated   Sit to Sand 2 sets;10 reps;without UE support   7lb dumbbell  Manual Therapy   Manual Therapy Soft tissue mobilization    Soft tissue mobilization Vastus medialis, adductor RLE                    PT Short Term Goals - 05/11/20 1150      PT SHORT TERM GOAL #1   Title independent with initial HEP    Status Achieved             PT Long Term Goals - 05/18/20 1143      PT LONG TERM GOAL #1   Title Pt will achieve 110 deg of knee flexion    Status Partially Met      PT LONG TERM GOAL #2   Title Pt will demonstrate 0 deg knee extension    Status On-going                 Plan - 06/23/20 3428    Clinical Impression Statement Good carryover form last session with stair negotiation despite some RLE weakness. Decrease TKE with uphill ambulation noted. Heavy forward lean needed with sit to  stands. Cues for eccentric control with RLE needed with step ups. Some tightness noted in toe medial R quad.    Personal Factors and Comorbidities Comorbidity 1    Examination-Activity Limitations Carry;Lift;Locomotion Level;Transfers;Sit;Stairs;Stand    Examination-Participation Restrictions Community Activity;Driving;Interpersonal Relationship    Stability/Clinical Decision Making Stable/Uncomplicated    Rehab Potential Good    PT Frequency 3x / week    PT Treatment/Interventions ADLs/Self Care Home Management;Cryotherapy;Electrical Stimulation;Iontophoresis 33m/ml Dexamethasone;Moist Heat;Traction;Ultrasound;Therapeutic activities;Gait training;Therapeutic exercise;Functional mobility training;Balance training;Neuromuscular re-education;Patient/family education;Manual techniques;Stair training;Scar mobilization;Passive range of motion;Vasopneumatic Device;Taping    PT Next Visit Plan assess and progress           Patient will benefit from skilled therapeutic intervention in order to improve the following deficits and impairments:  Improper body mechanics, Pain, Increased muscle spasms, Decreased range of motion, Decreased strength, Impaired flexibility, Difficulty walking, Abnormal gait, Impaired sensation  Visit Diagnosis: Difficulty in walking, not elsewhere classified  Localized edema  Acute pain of right knee  Muscle weakness (generalized)     Problem List Patient Active Problem List   Diagnosis Date Noted  . Status post total right knee replacement 04/08/2020  . Unilateral primary osteoarthritis, right knee 03/14/2020  . Lumbar radiculopathy, right 07/15/2019  . Hyperlipidemia 11/28/2018  . TIA (transient ischemic attack) 10/21/2018  . Essential hypertension 09/24/2018  . Chest pain at rest   . Degenerative joint disease of knee, right 05/08/2018  . Pain of left heel 02/23/2013  . Bacterial vaginosis 12/25/2011  . Allergic rhinitis 12/25/2011  . Lung nodule  08/24/2011  . Atypical chest pain 08/22/2011  . Depression 08/22/2011  . Preventative health care 08/22/2011    RScot Jun PTA 06/23/2020, 9:30 AM  [place] [address]Phone: [phone]  Fax:  [phone] Name: Debra StolzeMRN: [id number] of Birth: 802/19/1967

## 2020-06-27 ENCOUNTER — Ambulatory Visit (INDEPENDENT_AMBULATORY_CARE_PROVIDER_SITE_OTHER): Payer: BC Managed Care – PPO | Admitting: Orthopaedic Surgery

## 2020-06-27 ENCOUNTER — Encounter: Payer: Self-pay | Admitting: Orthopaedic Surgery

## 2020-06-27 DIAGNOSIS — Z96651 Presence of right artificial knee joint: Secondary | ICD-10-CM

## 2020-06-27 NOTE — Progress Notes (Signed)
The patient is now just over 11 weeks status post a right total knee arthroplasty.  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  Her work or short-term disability her to return on July 26 but I felt was more appropriate to keep her out of further to maximize her therapy given the fact that he is only 55 years old.  She is still having appropriate stiffness and pain and some swelling at 11 weeks with the right knee.  She is walking without assistive device.  On examination of her right knee her incisions healed nicely.  Her range of motion is almost full and the knee feels better overall ligamentously to me.  There is appropriate medial joint line tenderness and some swelling.  From my standpoint she will continue increase her activities and can return to work starting tomorrow.  All questions and concerns were answered and addressed.  I would like to see her back in 3 months with a standing AP and lateral of her right knee.

## 2020-06-28 ENCOUNTER — Ambulatory Visit: Payer: BC Managed Care – PPO | Attending: Orthopaedic Surgery | Admitting: Physical Therapy

## 2020-06-28 ENCOUNTER — Encounter: Payer: Self-pay | Admitting: Physical Therapy

## 2020-06-28 ENCOUNTER — Other Ambulatory Visit: Payer: Self-pay

## 2020-06-28 DIAGNOSIS — R600 Localized edema: Secondary | ICD-10-CM | POA: Diagnosis not present

## 2020-06-28 DIAGNOSIS — M6281 Muscle weakness (generalized): Secondary | ICD-10-CM

## 2020-06-28 DIAGNOSIS — M25561 Pain in right knee: Secondary | ICD-10-CM | POA: Diagnosis not present

## 2020-06-28 DIAGNOSIS — R262 Difficulty in walking, not elsewhere classified: Secondary | ICD-10-CM

## 2020-06-28 NOTE — Therapy (Signed)
[address] [place], [place], [address] Phone: [phone]   Fax:  [phone]  Physical Therapy Treatment  Patient Details  Name: Debra Carpenter MRN: [id number] Date of Birth: 11-25-1966 Referring Provider (PT): Jean Rosenthal   Encounter Date: 06/28/2020   PT End of Session - 06/28/20 1434    Visit Number 15    Date for PT Re-Evaluation 07/03/20    PT Start Time 1345    PT Stop Time 1430    PT Time Calculation (min) 45 min    Activity Tolerance Patient tolerated treatment well    Behavior During Therapy [facility] for tasks assessed/performed           Past Medical History:  Diagnosis Date  . Anginal pain ([place]) 9/12   states negative cardiology workup exception of murmur  . Anxiety and depression   . Bony sclerosis 08/22/2011  . GERD (gastroesophageal reflux disease)   . Heart murmur   . Hx of bronchitis   . Hypertension   . Lung nodule 08/24/2011    Past Surgical History:  Procedure Laterality Date  . ABDOMINAL HYSTERECTOMY  2007  . LAPAROSCOPIC GASTRIC BANDING  2011  . LAPAROSCOPIC GASTRIC SLEEVE RESECTION N/A 07/21/2013   Procedure: LAPAROSCOPIC GASTRIC SLEEVE RESECTION, LYSIS OF ADHESIONS, EGD;  Surgeon: Madilyn Hook, DO;  [facility]: [facility];  Service: General;  Laterality: N/A;  . LAPAROSCOPIC LYSIS OF ADHESIONS  07/21/2013   Procedure: LAPAROSCOPIC LYSIS OF ADHESIONS;  Surgeon: Madilyn Hook, DO;  [facility]: [facility];  Service: General;;  . LAPAROSCOPIC REPAIR AND REMOVAL OF GASTRIC BAND N/A 05/05/2013   Procedure: LAPAROSCOPIC REMOVAL OF ADJUSTABLE GASTRIC BAND AND PORT, ENDOSCOPY ;  Surgeon: Madilyn Hook, DO;  [facility];  Service: General;  Laterality: N/A;  laparoscopic removal of adjustable gastric band AND PORT   . TOTAL KNEE ARTHROPLASTY Right 04/08/2020   Procedure: RIGHT TOTAL KNEE ARTHROPLASTY;  Surgeon: Mcarthur Rossetti, MD;  [facility];  Service: Orthopedics;  Laterality:  Right;  . UPPER GI ENDOSCOPY  07/21/2013   Procedure: UPPER GI ENDOSCOPY;  Surgeon: Madilyn Hook, DO;  [facility];  Service: General;;    There were no vitals filed for this visit.   Subjective Assessment - 06/28/20 1350    Subjective "Im good today"    Currently in Pain? No/denies                             [facility] Adult PT Treatment/Exercise - 06/28/20 0001      Ambulation/Gait   Stairs Yes    Stairs Assistance 6: Modified independent (Device/Increase time)    Stair Management Technique No rails;One rail Right;Alternating pattern    Number of Stairs 48    Height of Stairs 6    Gait Comments RLE weakness noted      Lumbar Exercises: Aerobic   Tread Mill I10 R5 x2 min     Recumbent Bike L2 x 5 min       Knee/Hip Exercises: Machines for Strengthening   Cybex Knee Extension RLE 10lb 2x10     Cybex Knee Flexion RLE 20lb 2x15    Cybex Leg Press 60lb 3x10      Knee/Hip Exercises: Standing   Heel Raises Both;10 reps;2 seconds;3 sets    Walking with Sports Cord 50lb 4 way x4 each  PT Short Term Goals - 05/11/20 1150      PT SHORT TERM GOAL #1   Title independent with initial HEP    Status Achieved             PT Long Term Goals - 05/18/20 1143      PT LONG TERM GOAL #1   Title Pt will achieve 110 deg of knee flexion    Status Partially Met      PT LONG TERM GOAL #2   Title Pt will demonstrate 0 deg knee extension    Status On-going                 Plan - 06/28/20 1436    Clinical Impression Statement Pt continues to do well. She reported that the MD said this could be her last week. Some weakness present when descending stairs. increase resistance tolerated with all machine level interventions. Some instability with resisted side step.    Examination-Activity Limitations Carry;Lift;Locomotion Level;Transfers;Sit;Stairs;Stand    Examination-Participation Restrictions Community  Activity;Driving;Interpersonal Relationship    Stability/Clinical Decision Making Stable/Uncomplicated    Rehab Potential Good    PT Frequency 3x / week    PT Duration 8 weeks    PT Treatment/Interventions ADLs/Self Care Home Management;Cryotherapy;Electrical Stimulation;Iontophoresis 22m/ml Dexamethasone;Moist Heat;Traction;Ultrasound;Therapeutic activities;Gait training;Therapeutic exercise;Functional mobility training;Balance training;Neuromuscular re-education;Patient/family education;Manual techniques;Stair training;Scar mobilization;Passive range of motion;Vasopneumatic Device;Taping    PT Next Visit Plan assess and progress           Patient will benefit from skilled therapeutic intervention in order to improve the following deficits and impairments:  Improper body mechanics, Pain, Increased muscle spasms, Decreased range of motion, Decreased strength, Impaired flexibility, Difficulty walking, Abnormal gait, Impaired sensation  Visit Diagnosis: Difficulty in walking, not elsewhere classified  Localized edema  Acute pain of right knee  Muscle weakness (generalized)     Problem List Patient Active Problem List   Diagnosis Date Noted  . Status post total right knee replacement 04/08/2020  . Unilateral primary osteoarthritis, right knee 03/14/2020  . Lumbar radiculopathy, right 07/15/2019  . Hyperlipidemia 11/28/2018  . TIA (transient ischemic attack) 10/21/2018  . Essential hypertension 09/24/2018  . Chest pain at rest   . Degenerative joint disease of knee, right 05/08/2018  . Pain of left heel 02/23/2013  . Bacterial vaginosis 12/25/2011  . Allergic rhinitis 12/25/2011  . Lung nodule 08/24/2011  . Atypical chest pain 08/22/2011  . Depression 08/22/2011  . Preventative health care 08/22/2011    RScot Jun8/01/2020, 2:39 PM  [address] [address]Phone:  [phone]  Fax:  [phone] Name: Debra ScaliaMRN: [id number] of Birth: [birth year]

## 2020-06-30 ENCOUNTER — Encounter: Payer: Self-pay | Admitting: Physical Therapy

## 2020-06-30 ENCOUNTER — Ambulatory Visit: Payer: BC Managed Care – PPO | Admitting: Physical Therapy

## 2020-06-30 ENCOUNTER — Other Ambulatory Visit: Payer: Self-pay

## 2020-06-30 DIAGNOSIS — M25561 Pain in right knee: Secondary | ICD-10-CM | POA: Diagnosis not present

## 2020-06-30 DIAGNOSIS — M6281 Muscle weakness (generalized): Secondary | ICD-10-CM | POA: Diagnosis not present

## 2020-06-30 DIAGNOSIS — R600 Localized edema: Secondary | ICD-10-CM

## 2020-06-30 DIAGNOSIS — R262 Difficulty in walking, not elsewhere classified: Secondary | ICD-10-CM

## 2020-06-30 NOTE — Therapy (Signed)
[address] [place], [place], [address] Phone: [phone]   Fax:  [phone]  Physical Therapy Treatment  Patient Details  Name: Libni Fusaro MRN: [id number] Date of Birth: October 02, 1966 Referring Provider (PT): Jean Rosenthal   Encounter Date: 06/30/2020   PT End of Session - 06/30/20 1643    Visit Number 16    Date for PT Re-Evaluation 07/03/20    PT Start Time 1600    PT Stop Time 1643    PT Time Calculation (min) 43 min    Activity Tolerance Patient tolerated treatment well    Behavior During Therapy [facility] for tasks assessed/performed           Past Medical History:  Diagnosis Date  . Anginal pain ([place]) 9/12   states negative cardiology workup exception of murmur  . Anxiety and depression   . Bony sclerosis 08/22/2011  . GERD (gastroesophageal reflux disease)   . Heart murmur   . Hx of bronchitis   . Hypertension   . Lung nodule 08/24/2011    Past Surgical History:  Procedure Laterality Date  . ABDOMINAL HYSTERECTOMY  2007  . LAPAROSCOPIC GASTRIC BANDING  2011  . LAPAROSCOPIC GASTRIC SLEEVE RESECTION N/A 07/21/2013   Procedure: LAPAROSCOPIC GASTRIC SLEEVE RESECTION, LYSIS OF ADHESIONS, EGD;  Surgeon: Madilyn Hook, DO;  [facility];  Service: General;  Laterality: N/A;  . LAPAROSCOPIC LYSIS OF ADHESIONS  07/21/2013   Procedure: LAPAROSCOPIC LYSIS OF ADHESIONS;  Surgeon: Madilyn Hook, DO;  [facility];  Service: General;;  . LAPAROSCOPIC REPAIR AND REMOVAL OF GASTRIC BAND N/A 05/05/2013   Procedure: LAPAROSCOPIC REMOVAL OF ADJUSTABLE GASTRIC BAND AND PORT, ENDOSCOPY ;  Surgeon: Madilyn Hook, DO;  [facility];  Service: General;  Laterality: N/A;  laparoscopic removal of adjustable gastric band AND PORT   . TOTAL KNEE ARTHROPLASTY Right 04/08/2020   Procedure: RIGHT TOTAL KNEE ARTHROPLASTY;  Surgeon: Mcarthur Rossetti, MD;  [facility];  Service: Orthopedics;  Laterality:  Right;  . UPPER GI ENDOSCOPY  07/21/2013   Procedure: UPPER GI ENDOSCOPY;  Surgeon: Madilyn Hook, DO;  [facility];  Service: General;;    There were no vitals filed for this visit.   Subjective Assessment - 06/30/20 1606    Subjective Knee is very stiff from going back at work, requires her to sit for a long period of time    Currently in Pain? No/denies              [facility] PT Assessment - 06/30/20 0001      PROM   Right Knee Extension 2    Right Knee Flexion 110      Strength   Overall Strength Comments R knee ext 4+/10; R knee Flex 4/10                         [facility] Adult PT Treatment/Exercise - 06/30/20 0001      Lumbar Exercises: Aerobic   Tread Mill I10 R5 x2 min     Recumbent Bike L1 x 4 min       Knee/Hip Exercises: Machines for Strengthening   Cybex Knee Extension RLE 10lb 2x10     Cybex Knee Flexion RLE 20lb x15, 25lb x10     Cybex Leg Press 60lb 3x10      Knee/Hip Exercises: Standing   Functional Squat 2 sets;10 reps  PT Short Term Goals - 05/11/20 1150      PT SHORT TERM GOAL #1   Title independent with initial HEP    Status Achieved             PT Long Term Goals - 06/30/20 1647      PT LONG TERM GOAL #3   Title Pt will demonstrate TUG time <15 sec with no AD    Baseline 11    Status Achieved                 Plan - 06/30/20 1647    Clinical Impression Statement Pt has progressed meeting most goals. She is missing ~ 2 degrees of R knee extension. She reports being pleased with her current functional status.    Comorbidities HTN    Examination-Activity Limitations Carry;Lift;Locomotion Level;Transfers;Sit;Stairs;Stand    Examination-Participation Restrictions Community Activity;Driving;Interpersonal Relationship    Stability/Clinical Decision Making Stable/Uncomplicated    Rehab Potential Good    PT Frequency 3x / week    PT Duration 8 weeks    PT Treatment/Interventions ADLs/Self  Care Home Management;Cryotherapy;Electrical Stimulation;Iontophoresis 73m/ml Dexamethasone;Moist Heat;Traction;Ultrasound;Therapeutic activities;Gait training;Therapeutic exercise;Functional mobility training;Balance training;Neuromuscular re-education;Patient/family education;Manual techniques;Stair training;Scar mobilization;Passive range of motion;Vasopneumatic Device;Taping    PT Next Visit Plan D/C PT           Patient will benefit from skilled therapeutic intervention in order to improve the following deficits and impairments:  Improper body mechanics, Pain, Increased muscle spasms, Decreased range of motion, Decreased strength, Impaired flexibility, Difficulty walking, Abnormal gait, Impaired sensation  Visit Diagnosis: Localized edema  Difficulty in walking, not elsewhere classified  Acute pain of right knee  Muscle weakness (generalized)     Problem List Patient Active Problem List   Diagnosis Date Noted  . Status post total right knee replacement 04/08/2020  . Unilateral primary osteoarthritis, right knee 03/14/2020  . Lumbar radiculopathy, right 07/15/2019  . Hyperlipidemia 11/28/2018  . TIA (transient ischemic attack) 10/21/2018  . Essential hypertension 09/24/2018  . Chest pain at rest   . Degenerative joint disease of knee, right 05/08/2018  . Pain of left heel 02/23/2013  . Bacterial vaginosis 12/25/2011  . Allergic rhinitis 12/25/2011  . Lung nodule 08/24/2011  . Atypical chest pain 08/22/2011  . Depression 08/22/2011  . Preventative health care 08/22/2011   PHYSICAL THERAPY DISCHARGE SUMMARY  Visits from Start of Care: 16  Plan: Patient agrees to discharge.  Patient goals were partially met. Patient is being discharged due to being pleased with the current functional level.  ?????    AAmador Cunas PT, DPT RScot Jun PTA 06/30/2020, 4:49 PM  [address] [address]Phone: [phone]  Fax:  [phone] Name: LZaylie GislerMRN: [id number] of Birth: [birth year]

## 2020-07-15 ENCOUNTER — Other Ambulatory Visit: Payer: Self-pay

## 2020-07-15 NOTE — Telephone Encounter (Signed)
Pls advise.  

## 2020-07-18 ENCOUNTER — Other Ambulatory Visit: Payer: Self-pay | Admitting: Physician Assistant

## 2020-07-18 MED ORDER — HYDROCODONE-ACETAMINOPHEN 5-325 MG PO TABS: 1.0000 | ORAL_TABLET | Freq: Four times a day (QID) | ORAL | 0 refills | Status: DC | PRN

## 2020-07-19 DIAGNOSIS — E559 Vitamin D deficiency, unspecified: Secondary | ICD-10-CM | POA: Diagnosis not present

## 2020-07-19 DIAGNOSIS — E669 Obesity, unspecified: Secondary | ICD-10-CM | POA: Diagnosis not present

## 2020-07-19 DIAGNOSIS — R7303 Prediabetes: Secondary | ICD-10-CM | POA: Diagnosis not present

## 2020-07-19 DIAGNOSIS — B009 Herpesviral infection, unspecified: Secondary | ICD-10-CM | POA: Diagnosis not present

## 2020-07-19 DIAGNOSIS — R7301 Impaired fasting glucose: Secondary | ICD-10-CM | POA: Diagnosis not present

## 2020-07-19 DIAGNOSIS — I10 Essential (primary) hypertension: Secondary | ICD-10-CM | POA: Diagnosis not present

## 2020-07-19 DIAGNOSIS — Z6841 Body Mass Index (BMI) 40.0 and over, adult: Secondary | ICD-10-CM | POA: Diagnosis not present

## 2020-07-19 NOTE — Telephone Encounter (Signed)
Sent this in yesterday

## 2020-07-21 DIAGNOSIS — Z1159 Encounter for screening for other viral diseases: Secondary | ICD-10-CM | POA: Diagnosis not present

## 2020-07-21 DIAGNOSIS — I10 Essential (primary) hypertension: Secondary | ICD-10-CM | POA: Diagnosis not present

## 2020-07-21 DIAGNOSIS — Z903 Acquired absence of stomach [part of]: Secondary | ICD-10-CM | POA: Diagnosis not present

## 2020-07-21 DIAGNOSIS — E559 Vitamin D deficiency, unspecified: Secondary | ICD-10-CM | POA: Diagnosis not present

## 2020-07-21 DIAGNOSIS — R7301 Impaired fasting glucose: Secondary | ICD-10-CM | POA: Diagnosis not present

## 2020-07-21 DIAGNOSIS — K912 Postsurgical malabsorption, not elsewhere classified: Secondary | ICD-10-CM | POA: Diagnosis not present

## 2020-08-10 ENCOUNTER — Other Ambulatory Visit: Payer: Self-pay | Admitting: Physician Assistant

## 2020-08-10 DIAGNOSIS — R748 Abnormal levels of other serum enzymes: Secondary | ICD-10-CM | POA: Diagnosis not present

## 2020-08-10 DIAGNOSIS — Z9884 Bariatric surgery status: Secondary | ICD-10-CM | POA: Diagnosis not present

## 2020-08-10 DIAGNOSIS — Z6841 Body Mass Index (BMI) 40.0 and over, adult: Secondary | ICD-10-CM | POA: Diagnosis not present

## 2020-08-16 DIAGNOSIS — Z6841 Body Mass Index (BMI) 40.0 and over, adult: Secondary | ICD-10-CM | POA: Diagnosis not present

## 2020-08-16 DIAGNOSIS — Z713 Dietary counseling and surveillance: Secondary | ICD-10-CM | POA: Diagnosis not present

## 2020-08-16 DIAGNOSIS — Z9884 Bariatric surgery status: Secondary | ICD-10-CM | POA: Diagnosis not present

## 2020-08-16 DIAGNOSIS — E669 Obesity, unspecified: Secondary | ICD-10-CM | POA: Diagnosis not present

## 2020-08-17 ENCOUNTER — Other Ambulatory Visit: Payer: Self-pay | Admitting: Physician Assistant

## 2020-08-17 DIAGNOSIS — Z9884 Bariatric surgery status: Secondary | ICD-10-CM

## 2020-08-23 ENCOUNTER — Other Ambulatory Visit: Payer: Self-pay | Admitting: Physician Assistant

## 2020-08-23 ENCOUNTER — Other Ambulatory Visit: Payer: BC Managed Care – PPO

## 2020-08-23 DIAGNOSIS — R748 Abnormal levels of other serum enzymes: Secondary | ICD-10-CM

## 2020-08-24 DIAGNOSIS — R7989 Other specified abnormal findings of blood chemistry: Secondary | ICD-10-CM | POA: Diagnosis not present

## 2020-08-24 DIAGNOSIS — R748 Abnormal levels of other serum enzymes: Secondary | ICD-10-CM | POA: Diagnosis not present

## 2020-08-26 ENCOUNTER — Ambulatory Visit: Payer: BC Managed Care – PPO | Admitting: Physician Assistant

## 2020-08-29 ENCOUNTER — Other Ambulatory Visit: Payer: Self-pay | Admitting: Physician Assistant

## 2020-08-29 ENCOUNTER — Ambulatory Visit
Admission: RE | Admit: 2020-08-29 | Discharge: 2020-08-29 | Disposition: A | Discharge status: Home / Self Care | Payer: BC Managed Care – PPO | Source: Ambulatory Visit | Attending: Physician Assistant | Admitting: Physician Assistant

## 2020-08-29 DIAGNOSIS — K219 Gastro-esophageal reflux disease without esophagitis: Secondary | ICD-10-CM | POA: Diagnosis not present

## 2020-08-29 DIAGNOSIS — Z9884 Bariatric surgery status: Secondary | ICD-10-CM

## 2020-09-27 DIAGNOSIS — R748 Abnormal levels of other serum enzymes: Secondary | ICD-10-CM | POA: Diagnosis not present

## 2020-09-27 DIAGNOSIS — I10 Essential (primary) hypertension: Secondary | ICD-10-CM | POA: Diagnosis not present

## 2020-09-27 DIAGNOSIS — Z1231 Encounter for screening mammogram for malignant neoplasm of breast: Secondary | ICD-10-CM | POA: Diagnosis not present

## 2020-09-27 DIAGNOSIS — E559 Vitamin D deficiency, unspecified: Secondary | ICD-10-CM | POA: Diagnosis not present

## 2020-09-27 DIAGNOSIS — Z0000 Encounter for general adult medical examination without abnormal findings: Secondary | ICD-10-CM | POA: Diagnosis not present

## 2020-09-27 DIAGNOSIS — L819 Disorder of pigmentation, unspecified: Secondary | ICD-10-CM | POA: Diagnosis not present

## 2020-10-03 DIAGNOSIS — L810 Postinflammatory hyperpigmentation: Secondary | ICD-10-CM | POA: Diagnosis not present

## 2020-10-03 DIAGNOSIS — I781 Nevus, non-neoplastic: Secondary | ICD-10-CM | POA: Diagnosis not present

## 2020-10-03 DIAGNOSIS — L579 Skin changes due to chronic exposure to nonionizing radiation, unspecified: Secondary | ICD-10-CM | POA: Diagnosis not present

## 2020-10-12 ENCOUNTER — Encounter: Payer: Self-pay | Admitting: Family Medicine

## 2020-10-12 ENCOUNTER — Ambulatory Visit (INDEPENDENT_AMBULATORY_CARE_PROVIDER_SITE_OTHER): Payer: BC Managed Care – PPO

## 2020-10-12 ENCOUNTER — Ambulatory Visit (INDEPENDENT_AMBULATORY_CARE_PROVIDER_SITE_OTHER): Payer: BC Managed Care – PPO | Admitting: Family Medicine

## 2020-10-12 ENCOUNTER — Ambulatory Visit: Payer: Self-pay

## 2020-10-12 ENCOUNTER — Other Ambulatory Visit: Payer: Self-pay

## 2020-10-12 VITALS — BP 118/78 | HR 59 | Ht 68.0 in | Wt 262.4 lb

## 2020-10-12 DIAGNOSIS — M79672 Pain in left foot: Secondary | ICD-10-CM

## 2020-10-12 DIAGNOSIS — M7732 Calcaneal spur, left foot: Secondary | ICD-10-CM | POA: Diagnosis not present

## 2020-10-12 DIAGNOSIS — M79671 Pain in right foot: Secondary | ICD-10-CM

## 2020-10-12 DIAGNOSIS — M722 Plantar fascial fibromatosis: Secondary | ICD-10-CM | POA: Diagnosis not present

## 2020-10-12 NOTE — Progress Notes (Signed)
X-ray shows a tiny heel spur at the location where you do not have pain.  Otherwise x-ray looks normal

## 2020-10-12 NOTE — Progress Notes (Signed)
I, Philbert Riser, LAT, ATC acting as a scribe for Clementeen Graham, MD.  Debra Carpenter is a 54 y.o. female who presents to [facility] at  [facility] today for L heel pn. Pt reports the pn is the exact same pn she experienced back in 2014. Pt describes pn as constant, gets worse as the day goes on.  She had plantar fasciitis in 2014 treated with injection which worked pretty well.  She is already tried using heel cushion and arch support.  She has not tried any exercises yet.  Pt also c/o R foot pn along the lateral aspect. No known MOI.    Pertinent review of systems: No fevers or chills  Relevant historical information: Right total knee replacement earlier this year.  History of obesity.   Exam:  BP 118/78 (BP Location: Right Arm, Patient Position: Sitting, Cuff Size: Normal)   Pulse (!) 59   Ht 5\' 8"  (1.727 m)   Wt 262 lb 6.4 oz (119 kg)   SpO2 99%   BMI 39.90 kg/m  General: Well Developed, well nourished, and in no acute distress.   MSK: Left foot normal-appearing Tender palpation plantar medial calcaneus. Foot is otherwise nontender with normal motion of the foot and ankle. Pulses cap refill and sensation are intact distally.  Right foot normal-appearing minimally tender midshaft fifth metatarsal. Normal foot and ankle motion. Pulses capillary refill and sensation are intact.    Lab and Radiology Results  X-ray images left calcaneus obtained today personally and independently interpreted Plantar calcaneal osteophyte.  Patient also has abnormal appearance of more distal appearing osteophyte calcaneus.  This is not well seen on the second image.  Could represent old injury or insertion site irritation.  This is not in the area of tenderness on exam however. Await formal radiology review  Procedure: Real-time Ultrasound Guided Injection of left plantar fascial insertion calcaneus Device: Philips Affiniti 50G Images permanently stored and available for review  in PACS Ultrasound examination prior to injection reveals thickened plantar fascia and heel spur.  Plantar fascial measures 5 mm thickness. Verbal informed consent obtained.  Discussed risks and benefits of procedure. Warned about infection bleeding damage to structures skin hypopigmentation and fat atrophy among others. Patient expresses understanding and agreement Time-out conducted.   Noted no overlying erythema, induration, or other signs of local infection.   Skin prepped in a sterile fashion.   Local anesthesia: Topical Ethyl chloride.   With sterile technique and under real time ultrasound guidance:  40 mg of Kenalog and 2 mL of Marcaine injected into the foot. Fluid seen entering the medial plantar fascia insertion site.   Completed without difficulty   Pain immediately resolved suggesting accurate placement of the medication.   Advised to call if fevers/chills, erythema, induration, drainage, or persistent bleeding.   Images permanently stored and available for review in the ultrasound unit.  Impression: Technically successful ultrasound guided injection.     Assessment and Plan: 54 y.o. female with left foot pain thought to be due to plantar fasciitis.  Plan for eccentric exercises arch straps Voltaren gel and injection today.  Check back in in 1 month especially if not improving.  Contralateral right foot pain thought to be overuse irritation fifth metatarsal.  Hopefully will improve with less left foot pain.  If not improving recheck and will proceed with x-ray ultrasound and possibly MRI to evaluate for stress reaction or stress fracture.   PDMP not reviewed this encounter. Orders Placed This Encounter  Procedures  .  [place] LIMITED JOINT SPACE STRUCTURES LOW BILAT(NO LINKED CHARGES)    Standing Status:   Future    Number of Occurrences:   1    Standing Expiration Date:   04/11/2021    Order Specific Question:   Reason for Exam (SYMPTOM  OR DIAGNOSIS REQUIRED)    Answer:    Bilateral foot pain    Order Specific Question:   Preferred imaging location?    Answer:   Adult nurse [facility]  . DG Os Calcis Left    Standing Status:   Future    Number of Occurrences:   1    Standing Expiration Date:   10/12/2021    Order Specific Question:   Reason for Exam (SYMPTOM  OR DIAGNOSIS REQUIRED)    Answer:   eval heel pain    Order Specific Question:   Is patient pregnant?    Answer:   No    Order Specific Question:   Preferred imaging location?    Answer:   [facility]   No orders of the defined types were placed in this encounter.    Discussed warning signs or symptoms. Please see discharge instructions. Patient expresses understanding.   The above documentation has been reviewed and is accurate and complete Clementeen Graham, M.D.

## 2020-10-12 NOTE — Patient Instructions (Addendum)
Thank you for coming in today.  Work on the heel exercises.  Go from up to down slowly .  30 reps 2-3x daily.   Try arch straps.  Please use voltaren gel up to 4x daily for pain as needed.   Continue the heel cushions in the shoes.   Recheck in 1 month especially if not improving.

## 2021-02-03 DIAGNOSIS — I10 Essential (primary) hypertension: Secondary | ICD-10-CM | POA: Diagnosis not present

## 2021-02-03 DIAGNOSIS — F988 Other specified behavioral and emotional disorders with onset usually occurring in childhood and adolescence: Secondary | ICD-10-CM | POA: Diagnosis not present

## 2021-02-03 DIAGNOSIS — E559 Vitamin D deficiency, unspecified: Secondary | ICD-10-CM | POA: Diagnosis not present

## 2021-02-03 DIAGNOSIS — R748 Abnormal levels of other serum enzymes: Secondary | ICD-10-CM | POA: Diagnosis not present

## 2021-02-21 ENCOUNTER — Ambulatory Visit: Payer: BC Managed Care – PPO | Admitting: Family Medicine

## 2021-02-27 ENCOUNTER — Ambulatory Visit: Payer: Self-pay

## 2021-02-27 ENCOUNTER — Other Ambulatory Visit: Payer: Self-pay

## 2021-02-27 ENCOUNTER — Ambulatory Visit: Payer: BC Managed Care – PPO | Admitting: Orthopaedic Surgery

## 2021-02-27 DIAGNOSIS — Z96651 Presence of right artificial knee joint: Secondary | ICD-10-CM | POA: Diagnosis not present

## 2021-02-27 DIAGNOSIS — M25562 Pain in left knee: Secondary | ICD-10-CM | POA: Diagnosis not present

## 2021-02-27 DIAGNOSIS — G8929 Other chronic pain: Secondary | ICD-10-CM | POA: Diagnosis not present

## 2021-02-27 DIAGNOSIS — M79671 Pain in right foot: Secondary | ICD-10-CM | POA: Diagnosis not present

## 2021-02-27 MED ORDER — LIDOCAINE HCL 1 % IJ SOLN
1.0000 mL | INTRAMUSCULAR | Status: AC | PRN
  Administered 2021-02-27: 1 mL

## 2021-02-27 MED ORDER — METHYLPREDNISOLONE ACETATE 40 MG/ML IJ SUSP
40.0000 mg | INTRAMUSCULAR | Status: AC | PRN
  Administered 2021-02-27: 40 mg

## 2021-02-27 NOTE — Progress Notes (Signed)
Office Visit Note   Patient: Debra Carpenter           Date of Birth: October 03, 1966           MRN: [id number] Visit Date: 02/27/2021              Requested by: Henri Medal, MD [address] [place],  [place] [address] PCP: Jackalyn Lombard I, MD   Assessment & Plan: Visit Diagnoses:  1. History of total right knee replacement   2. Heel pain, chronic, right     Plan: I did recommend a steroid injection with the right heel and the plantar aspect of it and she did request this as well.  She did tolerate the injection well in her foot.  I recommended firmer shoe wear as well.  All questions and concerns were answered and addressed.  Follow-up can be as needed.  If there is any issues with her knee replacement she also needs to let us know.  Follow-Up Instructions: Return if symptoms worsen or fail to improve.   Orders:  Orders Placed This Encounter  Procedures  . Foot Inj  . XR Knee 1-2 Views Right  . XR Os Calcis Right   No orders of the defined types were placed in this encounter.     Procedures: Foot Inj  Date/Time: 02/27/2021 9:07 AM Performed by: Kathryne Hitch, MD Authorized by: Kathryne Hitch, MD   Condition: Plantar Fasciitis   Location: right plantar fascia muscle   Medications:  1 mL lidocaine 1 %; 40 mg methylPREDNISolone acetate 40 MG/ML     Clinical Data: No additional findings.   Subjective: Chief Complaint  Patient presents with  . Right Knee - Pain  . Right Heel - Pain  The patient is now 11 months out from a right total knee arthroplasty.  This was a press-fit knee system.  She is only 55 years old.  She is of the knee is been doing well.  Occasionally gets stiff at times and there is numbness laterally but there is no significant issues.  She has been dealing with 2 months of right heel pain on the plantar aspect of her right heel.  There is no known injury.  Her worst pain is the first step in the morning.  She has been trying to  wear supportive shoes and working on stretching.  She bought some type of night splint for that foot and has tried topical anti-inflammatories.  She denies any injury.  HPI  Review of Systems There is currently no headache, chest pain, shortness of breath, fever, chills, nausea, vomiting  Objective: Vital Signs: There were no vitals taken for this visit.  Physical Exam She is alert and orient x3 and in no acute distress Ortho Exam Examination of her right heel shows a tight heel cord.  There is pain on the plantar aspect of the foot and there is slight flattening of her foot.  Her right operative knee shows a with old surgical incision.  Her range of motion is full and it is stable on exam.  There is no effusion. Specialty Comments:  No specialty comments available.  Imaging: XR Knee 1-2 Views Right  Result Date: 02/27/2021 2 views of the right knee show well-seated total knee arthroplasty with no complicating features.  XR Os Calcis Right  Result Date: 02/27/2021 2 views of the right calcaneus show no acute findings or fracture.  There is spurring underneath the calcaneus at the plantar aspect  which is typical.    PMFS History: Patient Active Problem List   Diagnosis Date Noted  . Status post total right knee replacement 04/08/2020  . Unilateral primary osteoarthritis, right knee 03/14/2020  . Lumbar radiculopathy, right 07/15/2019  . Hyperlipidemia 11/28/2018  . TIA (transient ischemic attack) 10/21/2018  . Essential hypertension 09/24/2018  . Chest pain at rest   . Degenerative joint disease of knee, right 05/08/2018  . Pain of left heel 02/23/2013  . Bacterial vaginosis 12/25/2011  . Allergic rhinitis 12/25/2011  . Lung nodule 08/24/2011  . Atypical chest pain 08/22/2011  . Depression 08/22/2011  . Preventative health care 08/22/2011   Past Medical History:  Diagnosis Date  . Anginal pain ([facility]) 9/12   states negative cardiology workup exception of murmur  .  Anxiety and depression   . Bony sclerosis 08/22/2011  . GERD (gastroesophageal reflux disease)   . Heart murmur   . Hx of bronchitis   . Hypertension   . Lung nodule 08/24/2011    Family History  Problem Relation Age of Onset  . Hypertension Mother   . Hypertension Father   . Cancer Father        ? colon cancer  . Cancer Maternal Grandmother        ovarian cancer  . Hypertension Maternal Grandfather   . Hypertension Paternal Grandfather     Past Surgical History:  Procedure Laterality Date  . ABDOMINAL HYSTERECTOMY  2007  . LAPAROSCOPIC GASTRIC BANDING  2011  . LAPAROSCOPIC GASTRIC SLEEVE RESECTION N/A 07/21/2013   Procedure: LAPAROSCOPIC GASTRIC SLEEVE RESECTION, LYSIS OF ADHESIONS, EGD;  Surgeon: Lodema Pilot, DO;  [facility];  Service: General;  Laterality: N/A;  . LAPAROSCOPIC LYSIS OF ADHESIONS  07/21/2013   Procedure: LAPAROSCOPIC LYSIS OF ADHESIONS;  Surgeon: Lodema Pilot, DO;  [facility];  Service: General;;  . LAPAROSCOPIC REPAIR AND REMOVAL OF GASTRIC BAND N/A 05/05/2013   Procedure: LAPAROSCOPIC REMOVAL OF ADJUSTABLE GASTRIC BAND AND PORT, ENDOSCOPY ;  Surgeon: Lodema Pilot, DO;  [facility];  Service: General;  Laterality: N/A;  laparoscopic removal of adjustable gastric band AND PORT   . TOTAL KNEE ARTHROPLASTY Right 04/08/2020   Procedure: RIGHT TOTAL KNEE ARTHROPLASTY;  Surgeon: Kathryne Hitch, MD;  [facility];  Service: Orthopedics;  Laterality: Right;  . UPPER GI ENDOSCOPY  07/21/2013   Procedure: UPPER GI ENDOSCOPY;  Surgeon: Lodema Pilot, DO;  [facility]: [facility];  Service: General;;   Social History   Occupational History  . 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 Smoking status: Never Smoker  . Smokeless tobacco: Never Used  Vaping Use  . Vaping Use: Never used  Substance and Sexual Activity  . Alcohol use: No  . Drug use: No  . Sexual activity: Not Currently    Birth control/protection:  Surgical

## 2021-03-21 ENCOUNTER — Other Ambulatory Visit: Payer: Self-pay | Admitting: Orthopaedic Surgery

## 2021-09-26 IMAGING — MR MR KNEE*R* W/O CM
7 series · 40 of 40 positions shown · non-contrast
Comparison: X-ray 12/21/2019

CLINICAL DATA: Lateral right knee pain after twisting injury 1 week
ago

EXAM:
MRI OF THE RIGHT KNEE WITHOUT CONTRAST
TECHNIQUE: Multiplanar, multisequence MR imaging of the knee was performed. No
intravenous contrast was administered.

[Series 6: T2 fat-sat · axial · 4.0mm · 0.53mm/px · z∈[-71,+99]mm · 6 of 35 slices shown (1 of 3)]
[im 1/35]
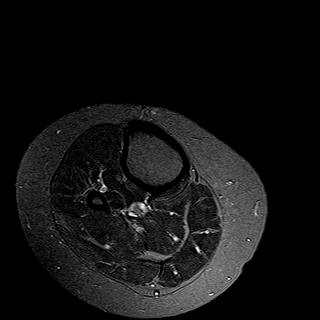
[im 7/35]
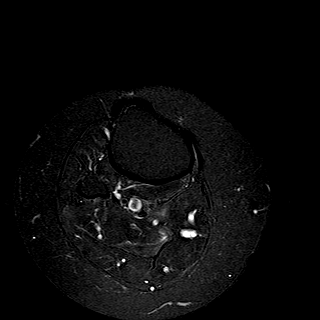
[im 14/35]
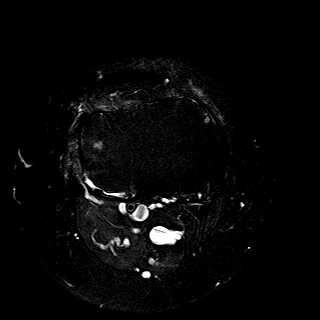
[im 21/35]
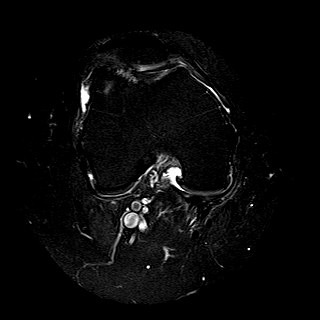
[im 28/35]
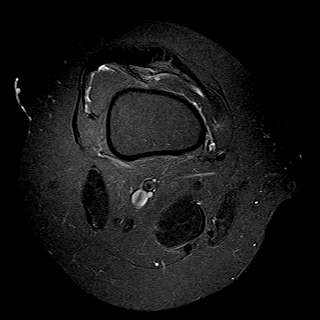
[im 35/35]
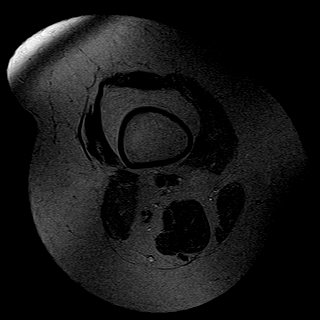

[Series 7: T2 fat-sat · coronal · 4.0mm · 0.62mm/px · 5 of 30 slices shown (2 of 3)]
[im 1/30]
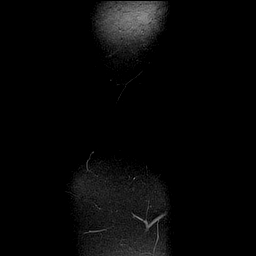
[im 8/30]
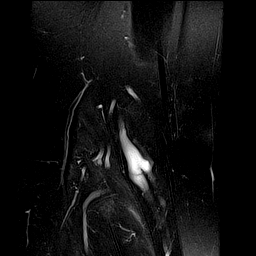
[im 15/30]
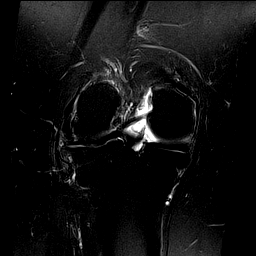
[im 22/30]
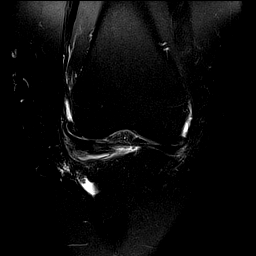
[im 30/30]
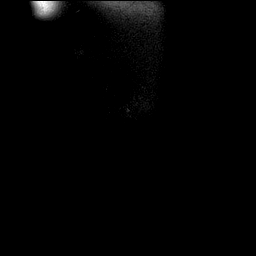

[Series 8: T1 · coronal · 4.0mm · 0.62mm/px · 5 of 30 slices shown]
[im 1/30]
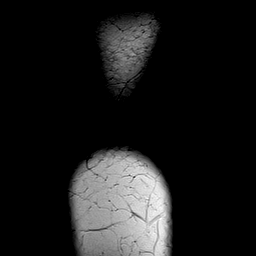
[im 8/30]
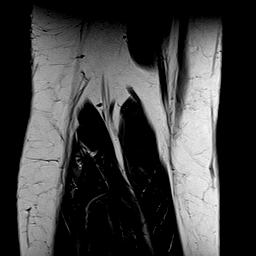
[im 15/30]
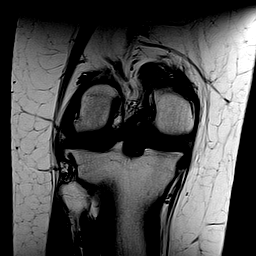
[im 22/30]
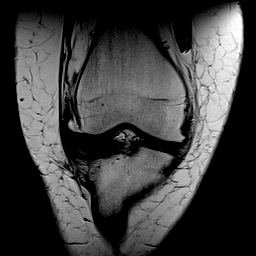
[im 30/30]
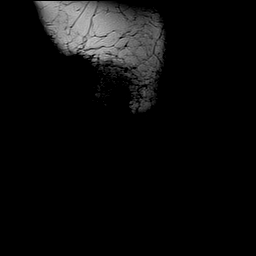

[Series 9: PD fat-sat · sagittal · 3.0mm · 0.62mm/px · 7 of 35 slices shown (1 of 3)]
[im 1/35]
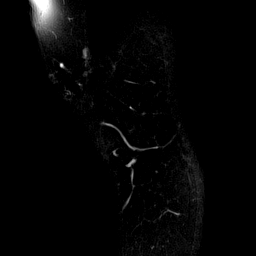
[im 6/35]
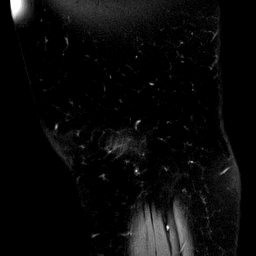
[im 12/35]
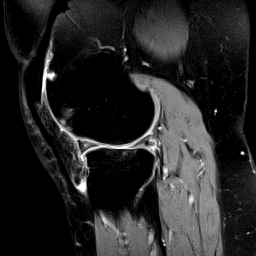
[im 18/35]
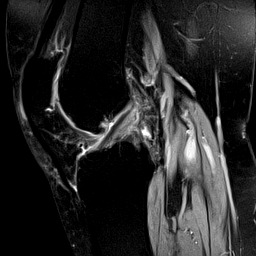
[im 23/35]
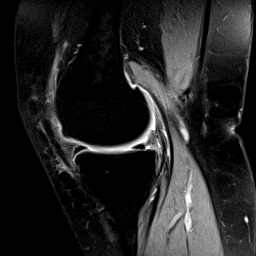
[im 29/35]
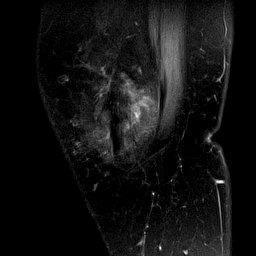
[im 35/35]
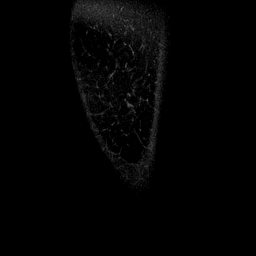

[Series 10: PD fat-sat · coronal · 4.0mm · 0.62mm/px · 6 of 30 slices shown (2 of 3)]
[im 1/30]
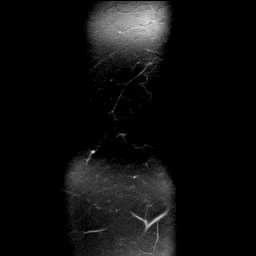
[im 6/30]
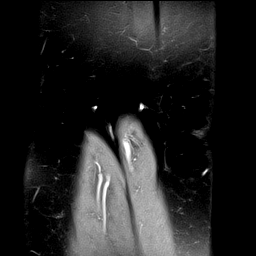
[im 12/30]
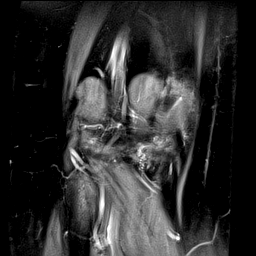
[im 18/30]
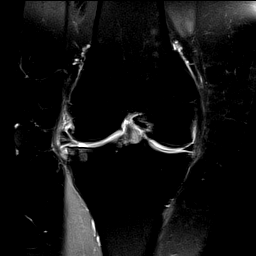
[im 24/30]
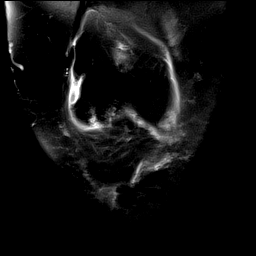
[im 30/30]
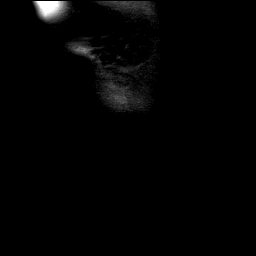

[Series 11: T2 fat-sat · sagittal · 3.0mm · 0.62mm/px · 7 of 35 slices shown (3 of 3)]
[im 1/35]
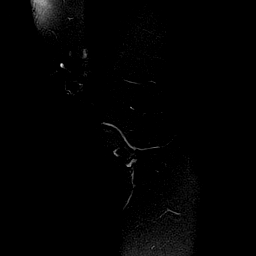
[im 6/35]
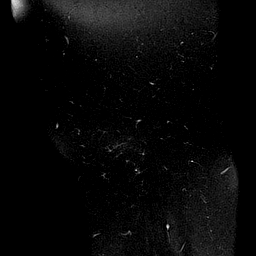
[im 12/35]
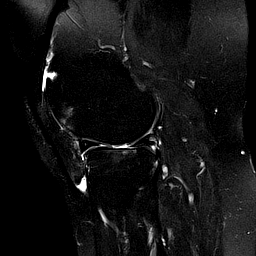
[im 18/35]
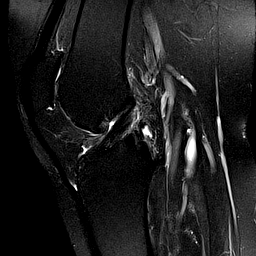
[im 23/35]
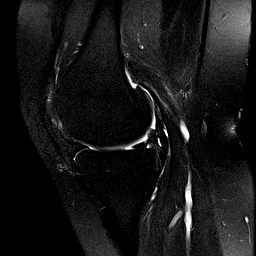
[im 29/35]
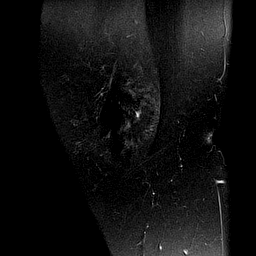
[im 35/35]
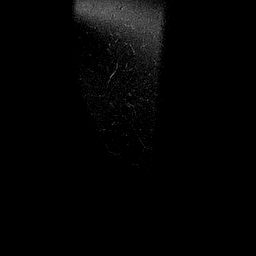

[Series 12: PD fat-sat · oblique · 2.0mm · 0.62mm/px · 4 of 19 slices shown (3 of 3)]
[im 1/19]
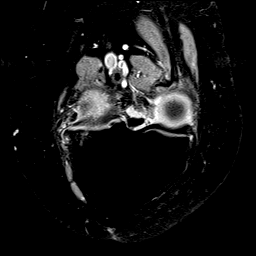
[im 7/19]
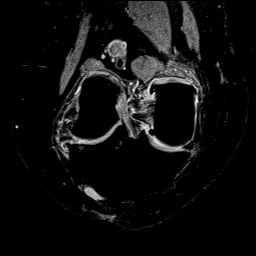
[im 13/19]
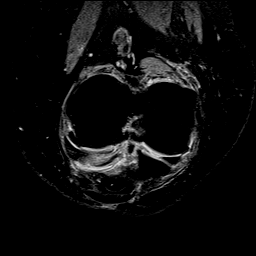
[im 19/19]
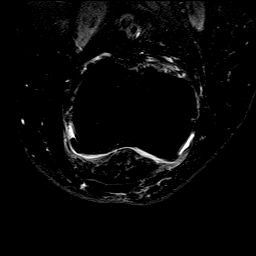

[40 of 40 positions shown; findings below may reference images not displayed]

FINDINGS: MENISCI

Medial meniscus: Mild intrasubstance degeneration of the body
segment. No discrete medial meniscal tear.

Lateral meniscus: Maceration of the lateral meniscus centered at the
posterior horn-body junction with complex tears with prominent
radial and oblique undersurface components. Meniscus is laterally
extruded. No displaced meniscal tissue.

LIGAMENTS

Cruciates:  Intact ACL and PCL.

Collaterals: Medial collateral ligament is intact. Lateral
collateral ligament complex is intact.

CARTILAGE

Patellofemoral: High-grade patellofemoral chondral irregularities
with extensive full-thickness cartilage loss of the lateral patellar
facet and lateral trochlea. Prominent subchondral marrow changes
within the lateral trochlea.

Medial:  Mild chondral thinning without focal defect.

Lateral: Large irregular full-thickness cartilage defects involving
the central weight-bearing portions of the lateral femoral condyle
and lateral tibial plateau (series 11, images 10-12).

Joint:  Trace joint effusion. Mild edema within Hoffa's fat.

Popliteal Fossa:  Tiny Baker's cyst. Intact popliteus tendon.

Extensor Mechanism:  Intact quadriceps tendon and patellar tendon.

Bones: Tricompartmental joint space narrowing with prominent
marginal osteophyte formation most pronounced in the lateral and
patellofemoral compartments. Degenerative subchondral marrow changes
most pronounced within the lateral tibial plateau and lateral
trochlea. No fracture line or contour abnormality is seen.

Other: None.
IMPRESSION: 1. Maceration/complex tearing of the lateral meniscus centered at
the posterior horn-body junction.
2. Tricompartmental osteoarthritis, worst in the patellofemoral and
lateral tibiofemoral compartments where there is full-thickness
cartilage loss and subchondral marrow changes.
3. Trace joint effusion. Tiny Baker's cyst.

## 2021-10-15 ENCOUNTER — Other Ambulatory Visit: Payer: Self-pay | Admitting: Orthopaedic Surgery

## 2022-05-03 ENCOUNTER — Ambulatory Visit: Payer: BC Managed Care – PPO | Admitting: Physician Assistant

## 2022-05-10 ENCOUNTER — Ambulatory Visit: Payer: BC Managed Care – PPO | Admitting: Physician Assistant

## 2022-05-30 IMAGING — RF DG UGI W SINGLE CM
3 series · 14 of 20 positions shown · non-contrast
Comparison: None.

CLINICAL DATA: Epigastric pain. Gastroesophageal reflux disease.
Previous sleeve gastrectomy.

EXAM:
UPPER GI SERIES WITH KUB
TECHNIQUE: After obtaining a scout radiograph a routine upper GI series was
performed using thin barium.
FLUOROSCOPY TIME:  Fluoroscopy Time:  2 minutes 0 seconds
Radiation Exposure Index (if provided by the fluoroscopic device):
521.1 mGy
Number of Acquired Spot Images: 0

[Series 1: one shot · 0.14mm/px · 6 of 8 slices shown (1 of 2)]
[im 1/8]
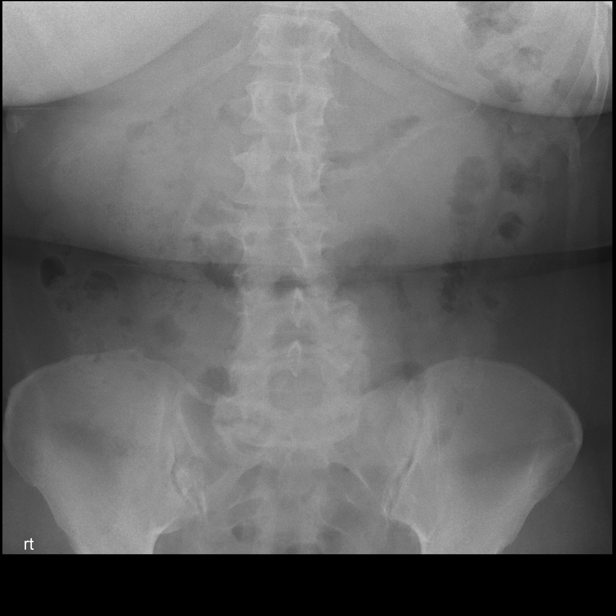
[im 3/8]
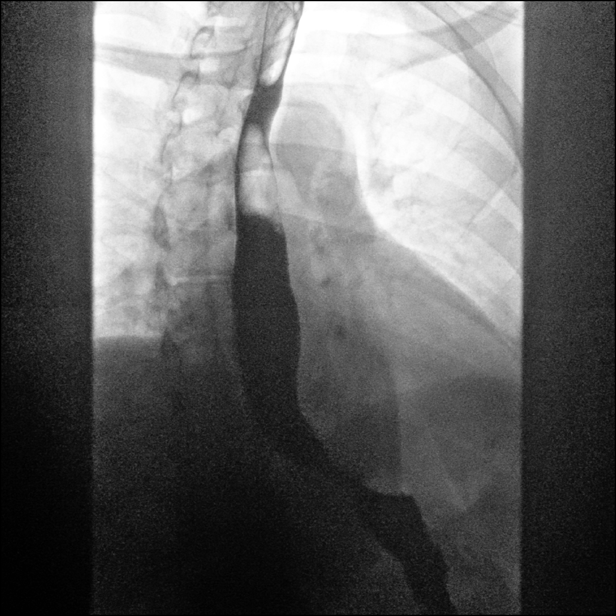
[im 4/8]
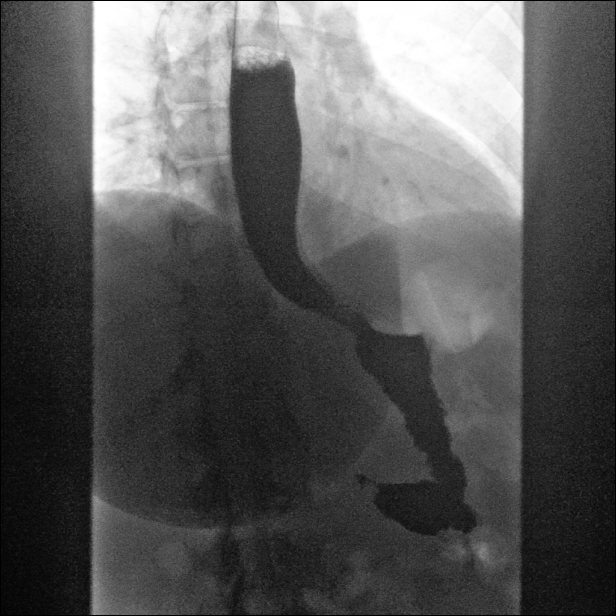
[im 6/8]
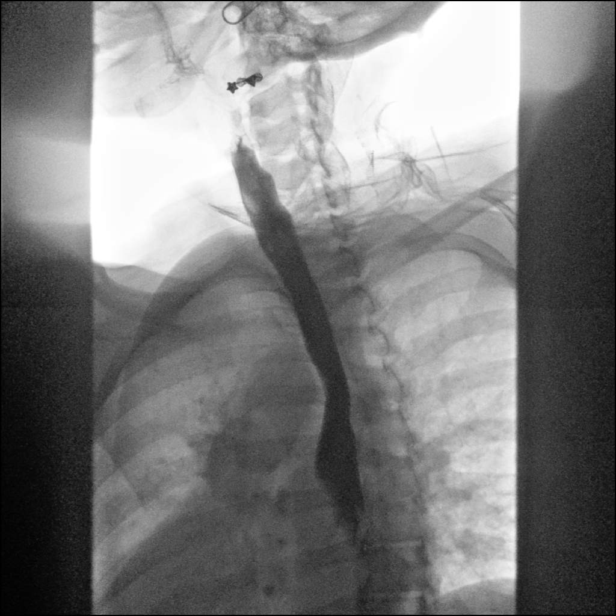
[im 7/8]
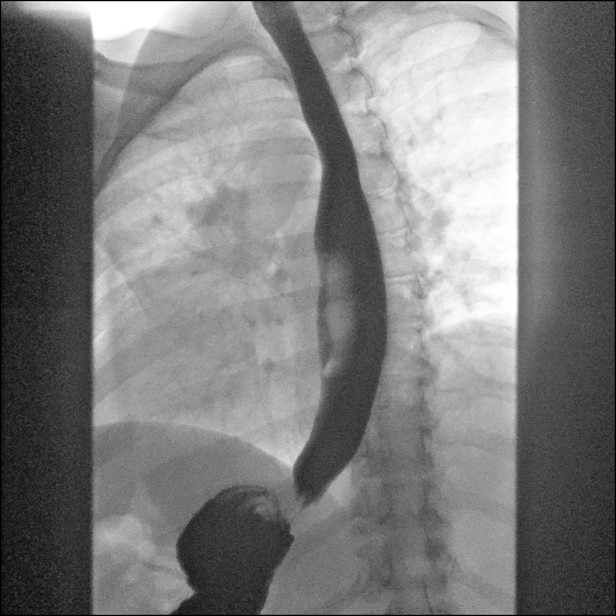
[im 8/8]
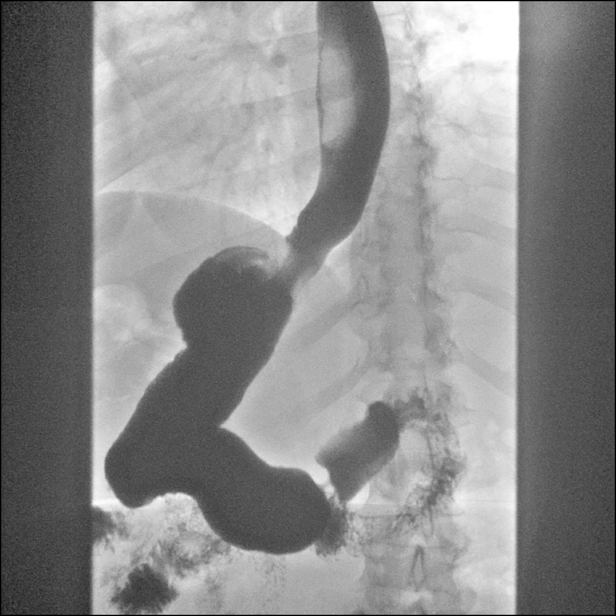

[Series 2: sequence · 2 of 19 frames shown]
[frame 9/19]
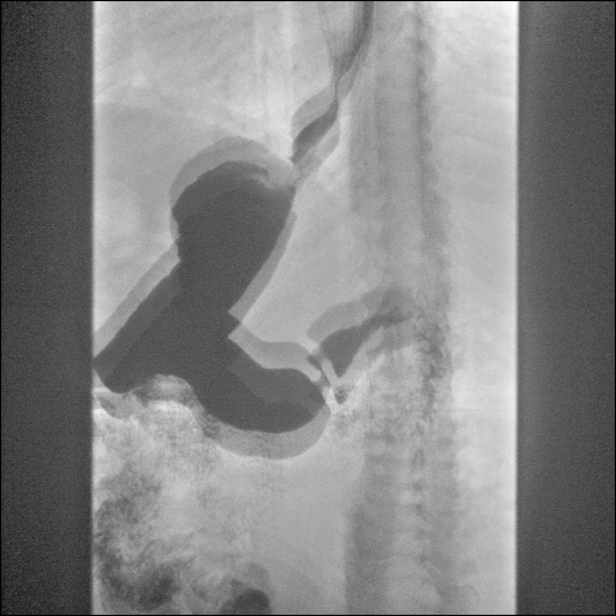
[frame 10/19]
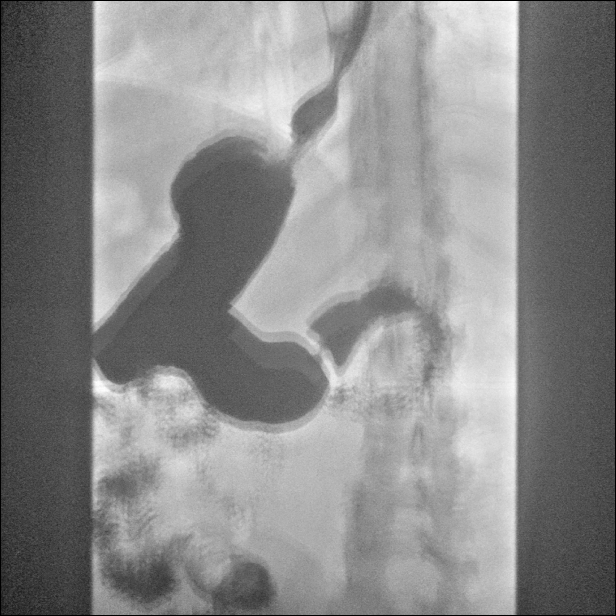

[Series 3: one shot · 6 of 8 slices shown (2 of 2)]
[im 1/8]
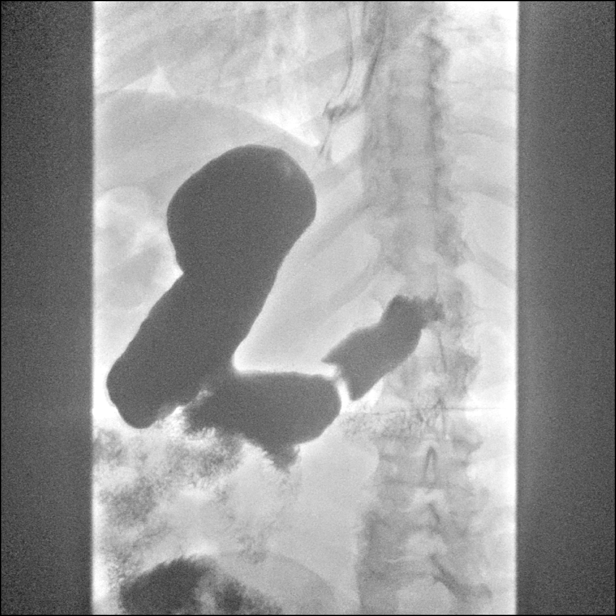
[im 2/8]
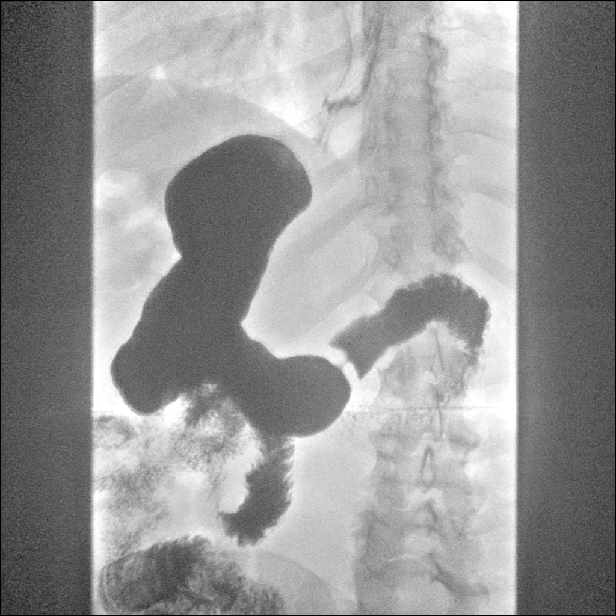
[im 4/8]
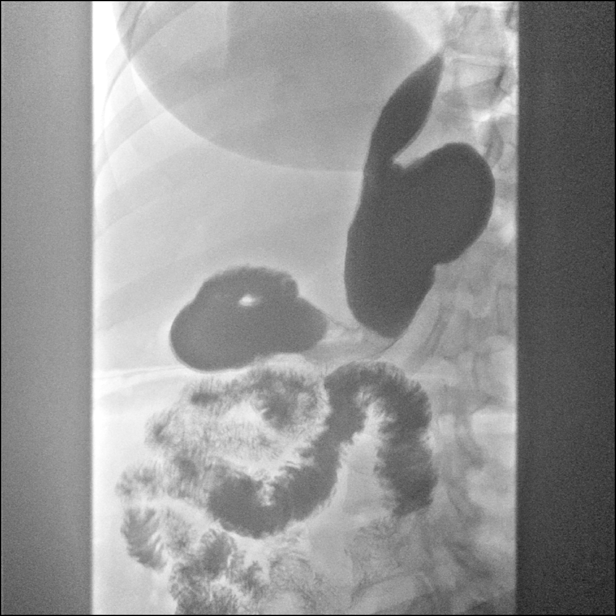
[im 5/8]
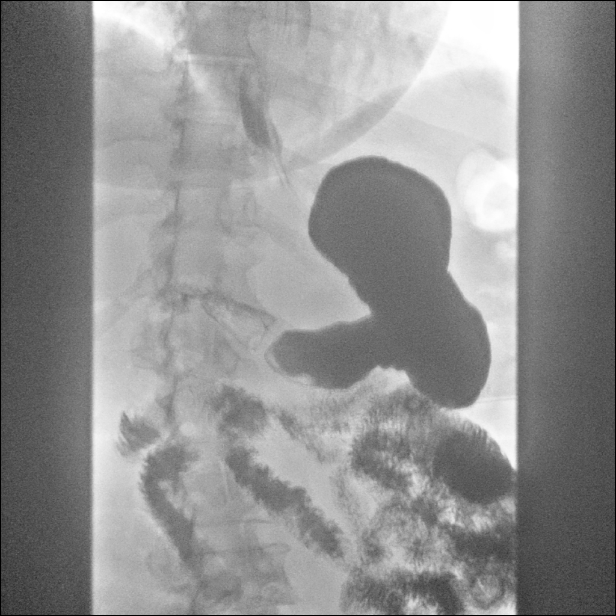
[im 6/8]
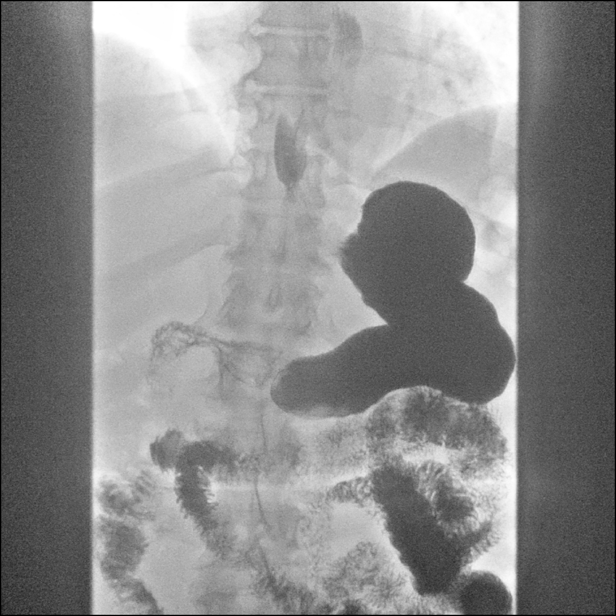
[im 8/8]
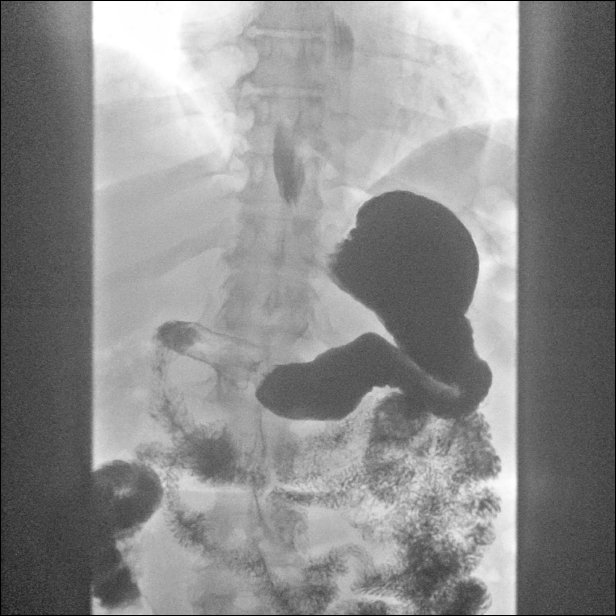

[14 of 20 positions shown; findings below may reference images not displayed]

FINDINGS: Scout radiograph: Unremarkable bowel gas pattern. Lumbar spine
degenerative changes noted.

Esophagus: No evidence of esophageal mass or stricture. Motility is
within normal limits. Small amount of gastroesophageal reflux noted
to the level of the distal thoracic esophagus.

Stomach: Expected postop changes are seen from previous sleeve
gastrectomy. No hiatal hernia visualized. No evidence of gastric
mass or ulcer.

Duodenum: No ulcer or other significant abnormality seen involving
duodenal bulb or sweep.

Other:  None.
IMPRESSION: Expected postop appearance status post sleeve gastrectomy.

Mild gastroesophageal reflux. No other significant abnormality
identified.

## 2022-08-01 ENCOUNTER — Ambulatory Visit (INDEPENDENT_AMBULATORY_CARE_PROVIDER_SITE_OTHER): Payer: BC Managed Care – PPO

## 2022-08-01 ENCOUNTER — Ambulatory Visit: Payer: BC Managed Care – PPO | Admitting: Orthopaedic Surgery

## 2022-08-01 DIAGNOSIS — G8929 Other chronic pain: Secondary | ICD-10-CM | POA: Diagnosis not present

## 2022-08-01 DIAGNOSIS — M7062 Trochanteric bursitis, left hip: Secondary | ICD-10-CM | POA: Diagnosis not present

## 2022-08-01 DIAGNOSIS — Z96651 Presence of right artificial knee joint: Secondary | ICD-10-CM | POA: Diagnosis not present

## 2022-08-01 DIAGNOSIS — M25561 Pain in right knee: Secondary | ICD-10-CM

## 2022-08-01 MED ORDER — METHYLPREDNISOLONE ACETATE 40 MG/ML IJ SUSP
40.0000 mg | INTRAMUSCULAR | Status: AC | PRN
  Administered 2022-08-01: 40 mg via INTRA_ARTICULAR

## 2022-08-01 MED ORDER — LIDOCAINE HCL 1 % IJ SOLN
3.0000 mL | INTRAMUSCULAR | Status: AC | PRN
  Administered 2022-08-01: 3 mL

## 2022-08-01 NOTE — Progress Notes (Signed)
Office Visit Note   Patient: Debra Carpenter           Date of Birth: [birth year]           MRN: [id number] Visit Date: 08/01/2022              Requested by: Henri Medal, MD [address] [place],  [place] [address] PCP: Jackalyn Lombard I, MD   Assessment & Plan: Visit Diagnoses:  1. History of total right knee replacement   2. Chronic pain of right knee   3. Trochanteric bursitis, left hip     Plan: I did give her reassurance that her knee replacement looks good.  I did offer her a steroid injection over her left hip trochanteric area and she agreed to this and tolerated it well.  I recommended outpatient physical therapy as well for that area and gave her prescription to consider someone down in [place] for this.  All questions and concerns were answered and addressed.  Follow-up is as needed.  Follow-Up Instructions: Return if symptoms worsen or fail to improve.   Orders:  Orders Placed This Encounter  Procedures   Large Joint Inj   XR KNEE 3 VIEW RIGHT   No orders of the defined types were placed in this encounter.     Procedures: Large Joint Inj: L greater trochanter on 08/01/2022 3:39 PM Indications: pain and diagnostic evaluation Details: 22 G 1.5 in needle, lateral approach  Arthrogram: No  Medications: 3 mL lidocaine 1 %; 40 mg methylPREDNISolone acetate 40 MG/ML Outcome: tolerated well, no immediate complications Procedure, treatment alternatives, risks and benefits explained, specific risks discussed. Consent was given by the patient. Immediately prior to procedure a time out was called to verify the correct patient, procedure, equipment, support staff and site/side marked as required. Patient was prepped and draped in the usual sterile fashion.       Clinical Data: No additional findings.   Subjective: Chief Complaint  Patient presents with   Right Knee - Pain  The patient is well-known to me.  We actually replaced her right knee with a  press-fit implant back in May 2021.  She is only 56 years old.  She has moved to [place] and recently fell on that right knee.  However she could not get anyone in [place] to see her and was told she needed to come back to [place] for [place] to take a look at her knee.  She wanted to make sure that everything was doing fine and things are actually feeling better from when she fell last week.  She has been having some left hip pain and she points to the trochanteric area of her left hip as a source of her pain.  She occasionally does get some right foot and ankle pain to the medial aspect of the foot and ankle.  HPI  Review of Systems There is currently listed no fever, chills, nausea, vomiting  Objective: Vital Signs: There were no vitals taken for this visit.  Physical Exam She is alert and orient x3 and in no acute distress Ortho Exam Examination of the right knee shows no effusion.  She has full range of motion of the right knee and it is ligamentously stable.  Examination of her left hip shows significant pain over the trochanteric area of the left hip but no blocks to rotation and no pain in the groin at all with rotation of the hip. Specialty Comments:  No  specialty comments available.  Imaging: XR KNEE 3 VIEW RIGHT  Result Date: 08/01/2022 3 views of the right knee show well-seated press-fit total knee arthroplasty with no complicating features.  There is no acute findings or evidence of fracture in light of the patient's recent fall.    PMFS History: Patient Active Problem List   Diagnosis Date Noted   Status post total right knee replacement 04/08/2020   Unilateral primary osteoarthritis, right knee 03/14/2020   Lumbar radiculopathy, right 07/15/2019   Hyperlipidemia 11/28/2018   TIA (transient ischemic attack) 10/21/2018   Essential hypertension 09/24/2018   Chest pain at rest    Degenerative joint disease of knee, right 05/08/2018   Pain of left heel  02/23/2013   Bacterial vaginosis 12/25/2011   Allergic rhinitis 12/25/2011   Lung nodule 08/24/2011   Atypical chest pain 08/22/2011   Depression 08/22/2011   Preventative health care 08/22/2011   Past Medical History:  Diagnosis Date   Anginal pain (HCC) 9/12   states negative cardiology workup exception of murmur   Anxiety and depression    Bony sclerosis 08/22/2011   GERD (gastroesophageal reflux disease)    Heart murmur    Hx of bronchitis    Hypertension    Lung nodule 08/24/2011    Family History  Problem Relation Age of Onset   Hypertension Mother    Hypertension Father    Cancer Father        ? colon cancer   Cancer Maternal Grandmother        ovarian cancer   Hypertension Maternal Grandfather    Hypertension Paternal Grandfather     Past Surgical History:  Procedure Laterality Date   ABDOMINAL HYSTERECTOMY  2007   LAPAROSCOPIC GASTRIC BANDING  2011   LAPAROSCOPIC GASTRIC SLEEVE RESECTION N/A 07/21/2013   Procedure: LAPAROSCOPIC GASTRIC SLEEVE RESECTION, LYSIS OF ADHESIONS, EGD;  Surgeon: Lodema Pilot, DO;  Location: [facility];  Service: General;  Laterality: N/A;   LAPAROSCOPIC LYSIS OF ADHESIONS  07/21/2013   Procedure: LAPAROSCOPIC LYSIS OF ADHESIONS;  Surgeon: Lodema Pilot, DO;  Location: [facility];  Service: General;;   LAPAROSCOPIC REPAIR AND REMOVAL OF GASTRIC BAND N/A 05/05/2013   Procedure: LAPAROSCOPIC REMOVAL OF ADJUSTABLE GASTRIC BAND AND PORT, ENDOSCOPY ;  Surgeon: Lodema Pilot, DO;  Location: [facility];  Service: General;  Laterality: N/A;  laparoscopic removal of adjustable gastric band AND PORT    TOTAL KNEE ARTHROPLASTY Right 04/08/2020   Procedure: RIGHT TOTAL KNEE ARTHROPLASTY;  Surgeon: Kathryne Hitch, MD;  Location: [facility];  Service: Orthopedics;  Laterality: Right;   UPPER GI ENDOSCOPY  07/21/2013   Procedure: UPPER GI ENDOSCOPY;  Surgeon: Lodema Pilot, DO;  Location: [facility];  Service: General;;   Social History   Occupational History    Occupation: IT for [employer]    Employer: [employer]  Tobacco Use   Smoking status: Never   Smokeless tobacco: Never  Vaping Use   Vaping Use: Never used  Substance and Sexual Activity   Alcohol use: No   Drug use: No   Sexual activity: Not Currently    Birth control/protection: Surgical

## 2022-08-10 ENCOUNTER — Other Ambulatory Visit: Payer: Self-pay | Admitting: Orthopaedic Surgery

## 2022-11-27 ENCOUNTER — Other Ambulatory Visit: Payer: Self-pay | Admitting: Orthopaedic Surgery

## 2023-01-31 ENCOUNTER — Encounter: Payer: Self-pay | Admitting: Radiology

## 2023-02-12 ENCOUNTER — Other Ambulatory Visit: Payer: Self-pay

## 2023-02-12 ENCOUNTER — Ambulatory Visit: Payer: BC Managed Care – PPO | Admitting: Family Medicine

## 2023-02-12 VITALS — BP 150/92 | HR 67 | Ht 68.0 in | Wt 261.0 lb

## 2023-02-12 DIAGNOSIS — M722 Plantar fascial fibromatosis: Secondary | ICD-10-CM | POA: Diagnosis not present

## 2023-02-12 DIAGNOSIS — G8929 Other chronic pain: Secondary | ICD-10-CM

## 2023-02-12 DIAGNOSIS — M79671 Pain in right foot: Secondary | ICD-10-CM

## 2023-02-12 NOTE — Progress Notes (Unsigned)
   [name], PhD, LAT, ATC acting as a scribe for [name], MD. Subjective:    CC: R heel pain  HPI: Debra Carpenter is a 57 y.o. female who presents to [facility] today for re-occurring R heel pain. Pt was last seen by Dr. [name] on 10/12/20 and was given a L plantar fascial insertion steroid injection and was advised to use arch straps, Voltaren gel, eccentric HEP. Pt was recently seen by [facility] on 12/04/22 for this issue and had a plantar fascial steroid injection. She moved to [place], but works in [place].  Today, pt reports R heel pain ongoing for about 1 year. Pt locates pain to the medial and plantar aspect of the R calcaneous.  Dx imaging: 02/27/21 R Os calcis XR  10/12/20 L Os calcis XR  Pertinent review of Systems: ***  Relevant historical information: ***   Objective:   There were no vitals filed for this visit. General: Well Developed, well nourished, and in no acute distress.   MSK: ***  Lab and Radiology Results No results found for this or any previous visit (from the past 72 hour(s)). No results found.    Impression and Recommendations:    Assessment and Plan: 57 y.o. female with ***.  PDMP not reviewed this encounter. No orders of the defined types were placed in this encounter.  No orders of the defined types were placed in this encounter.   Discussed warning signs or symptoms. Please see discharge instructions. Patient expresses understanding.   ***

## 2023-02-12 NOTE — Patient Instructions (Addendum)
Thank you for coming in today.   You received an injection today. Seek immediate medical attention if the joint becomes red, extremely painful, or is oozing fluid.   Please go to [facility] supply to get the Short CAM Clyde Canterbury we talked about today. You may also be able to get it from [employer].   [facility] [id number], [place], [place] [address]  Proceed to PT.   Follow up with your podiatrist at home.  There are other treatment options available.

## 2023-05-26 ENCOUNTER — Other Ambulatory Visit: Payer: Self-pay | Admitting: Orthopaedic Surgery

## 2023-12-12 ENCOUNTER — Telehealth: Payer: Self-pay | Admitting: Radiology

## 2023-12-12 ENCOUNTER — Other Ambulatory Visit: Payer: Self-pay | Admitting: Physician Assistant

## 2023-12-12 MED ORDER — IBUPROFEN 800 MG PO TABS: 800.0000 mg | ORAL_TABLET | Freq: Three times a day (TID) | ORAL | 0 refills | Status: DC | PRN

## 2023-12-12 NOTE — Telephone Encounter (Signed)
Requests refill of ibuprofen 800mg 

## 2024-07-06 ENCOUNTER — Ambulatory Visit (HOSPITAL_BASED_OUTPATIENT_CLINIC_OR_DEPARTMENT_OTHER): Admitting: Physician Assistant

## 2024-07-06 ENCOUNTER — Ambulatory Visit: Admitting: Sports Medicine

## 2024-07-06 ENCOUNTER — Other Ambulatory Visit (HOSPITAL_BASED_OUTPATIENT_CLINIC_OR_DEPARTMENT_OTHER): Payer: Self-pay | Admitting: Physician Assistant

## 2024-07-06 ENCOUNTER — Encounter (HOSPITAL_BASED_OUTPATIENT_CLINIC_OR_DEPARTMENT_OTHER): Payer: Self-pay | Admitting: Physician Assistant

## 2024-07-06 ENCOUNTER — Ambulatory Visit (HOSPITAL_BASED_OUTPATIENT_CLINIC_OR_DEPARTMENT_OTHER)

## 2024-07-06 DIAGNOSIS — M25562 Pain in left knee: Secondary | ICD-10-CM | POA: Diagnosis not present

## 2024-07-06 MED ORDER — TRIAMCINOLONE ACETONIDE 40 MG/ML IJ SUSP
80.0000 mg | INTRAMUSCULAR | Status: AC | PRN
Start: 2024-07-06 — End: 2024-07-06
  Administered 2024-07-06 (×2): 80 mg via INTRA_ARTICULAR

## 2024-07-06 MED ORDER — LIDOCAINE HCL 1 % IJ SOLN
2.0000 mL | INTRAMUSCULAR | Status: AC | PRN
  Administered 2024-07-06 (×2): 2 mL

## 2024-07-06 NOTE — Progress Notes (Signed)
 Office Visit Note   Patient: Debra Carpenter           Date of Birth: [birth year]           MRN: [id number] Visit Date: 07/06/2024              Requested by: Valma Jetta FERNS, MD [address] [place],  [place] [address] PCP: Valma Jetta I, MD   Assessment & Plan: Visit Diagnoses:  1. Acute pain of left knee     Plan: Please is a pleasant 58 year old woman who is a patient of Dr. Damian.  She comes in today with a chief complaint of left lateral knee pain.  She did this while helping her daughter move into college.  She did not have any significant injury.  She does have a history of knee replacement on her right knee.  X-rays today do show some valgus arthritis of the left knee.  Cannot appreciate any effusion or other red flags.  Suggested to try a injection today and to follow-up with Mar[facility] or Dr. Vernetta.  She does state is feeling better than initially when she could barely bear weight on it.  Follow-Up Instructions: Return in about 2 weeks (around 07/20/2024).   Orders:  Orders Placed This Encounter  Procedures   Large Joint Inj   No orders of the defined types were placed in this encounter.     Procedures: Large Joint Inj on 07/06/2024 10:11 AM Indications: pain and diagnostic evaluation Details: 1.5 in anterolateral approach  Arthrogram: No  Medications: 2 mL lidocaine  1 %; 80 mg triamcinolone  acetonide 40 MG/ML Outcome: tolerated well, no immediate complications Procedure, treatment alternatives, risks and benefits explained, specific risks discussed. Consent was given by the patient. Immediately prior to procedure a time out was called to verify the correct patient, procedure, equipment, support staff and site/side marked as required. Patient was prepped and draped in the usual sterile fashion.       Clinical Data: No additional findings.   Subjective: Chief Complaint  Patient presents with   Left Knee - Pain    HPI pleasant 58 year old woman with  a chief complaint of left lateral knee pain.  Has a history of right knee arthritis and a right knee replacement done by Dr. Vernetta.  She has done very well.  This occurred on her left knee when she was helping her daughter move into college initially she could not bear weight but now she is able to bear weight with crutches  Review of Systems  All other systems reviewed and are negative.    Objective: Vital Signs: There were no vitals taken for this visit.  Physical Exam Constitutional:      Appearance: Normal appearance.  Pulmonary:     Effort: Pulmonary effort is normal.  Neurological:     General: No focal deficit present.     Mental Status: She is alert and oriented to person, place, and time.  Psychiatric:        Mood and Affect: Mood normal.        Behavior: Behavior normal.     Ortho Exam Examination of her left knee she has no effusion no erythema no warmth.  Compartments are soft and compressible she is neurovascularly intact she does have some tenderness over the lateral joint line no grinding appreciated Specialty Comments:  No specialty comments available.  Imaging: No results found.   PMFS History: Patient Active Problem List   Diagnosis Date Noted   Status  post total right knee replacement 04/08/2020   Unilateral primary osteoarthritis, right knee 03/14/2020   Lumbar radiculopathy, right 07/15/2019   Hyperlipidemia 11/28/2018   TIA (transient ischemic attack) 10/21/2018   Essential hypertension 09/24/2018   Chest pain at rest    Degenerative joint disease of knee, right 05/08/2018   Pain of left heel 02/23/2013   Bacterial vaginosis 12/25/2011   Allergic rhinitis 12/25/2011   Lung nodule 08/24/2011   Atypical chest pain 08/22/2011   Depression 08/22/2011   Preventative health care 08/22/2011   Past Medical History:  Diagnosis Date   Anginal pain (HCC) 9/12   states negative cardiology workup exception of murmur   Anxiety and depression    Bony  sclerosis 08/22/2011   GERD (gastroesophageal reflux disease)    Heart murmur    Hx of bronchitis    Hypertension    Lung nodule 08/24/2011    Family History  Problem Relation Age of Onset   Hypertension Mother    Hypertension Father    Cancer Father        ? colon cancer   Cancer Maternal Grandmother        ovarian cancer   Hypertension Maternal Grandfather    Hypertension Paternal Grandfather     Past Surgical History:  Procedure Laterality Date   ABDOMINAL HYSTERECTOMY  2007   LAPAROSCOPIC GASTRIC BANDING  2011   LAPAROSCOPIC GASTRIC SLEEVE RESECTION N/A 07/21/2013   Procedure: LAPAROSCOPIC GASTRIC SLEEVE RESECTION, LYSIS OF ADHESIONS, EGD;  Surgeon: Redell Faith, DO;  [facility];  Service: General;  Laterality: N/A;   LAPAROSCOPIC LYSIS OF ADHESIONS  07/21/2013   Procedure: LAPAROSCOPIC LYSIS OF ADHESIONS;  Surgeon: Redell Faith, DO;  [facility];  Service: General;;   LAPAROSCOPIC REPAIR AND REMOVAL OF GASTRIC BAND N/A 05/05/2013   Procedure: LAPAROSCOPIC REMOVAL OF ADJUSTABLE GASTRIC BAND AND PORT, ENDOSCOPY ;  Surgeon: Redell Faith, DO;  [facility]: [facility];  Service: General;  Laterality: N/A;  laparoscopic removal of adjustable gastric band AND PORT    TOTAL KNEE ARTHROPLASTY Right 04/08/2020   Procedure: RIGHT TOTAL KNEE ARTHROPLASTY;  Surgeon: Vernetta Lonni GRADE, MD;  [facility];  Service: Orthopedics;  Laterality: Right;   UPPER GI ENDOSCOPY  07/21/2013   Procedure: UPPER GI ENDOSCOPY;  Surgeon: Redell Faith, DO;  Location: [facility];  Service: General;;   Social History   Occupational History   Occupation: IT for [employer]    Employer: [employer]  Tobacco Use   Smoking status: Never   Smokeless tobacco: Never  Vaping Use   Vaping status: Never Used  Substance and Sexual Activity   Alcohol use: No   Drug use: No   Sexual activity: Not Currently    Birth control/protection: Surgical

## 2024-08-04 NOTE — Progress Notes (Unsigned)
   LILLETTE Ileana Collet, PhD, LAT, ATC acting as a scribe for Artist Lloyd, MD.  Debra Carpenter is a 58 y.o. female who presents to [facility] today for L knee pain x ***. Pain started when she was helping her daughter move into college. Pt was seen at the [facility] on 8/11 and her L knee was injected w/ a steroid. Pt locates pain to ***  : Knee swelling: Mechanical symptoms: Aggravates: Treatments tried:  Dx imaging: 07/06/24 L knee XR  Pertinent review of systems: ***  Relevant historical information: ***   Exam:  There were no vitals taken for this visit. General: Well Developed, well nourished, and in no acute distress.   MSK: ***    Lab and Radiology Results No results found for this or any previous visit (from the past 72 hours). No results found.     Assessment and Plan: 58 y.o. female with ***   PDMP not reviewed this encounter. No orders of the defined types were placed in this encounter.  No orders of the defined types were placed in this encounter.    Discussed warning signs or symptoms. Please see discharge instructions. Patient expresses understanding.   ***

## 2024-08-05 ENCOUNTER — Telehealth: Payer: Self-pay

## 2024-08-05 ENCOUNTER — Other Ambulatory Visit: Payer: Self-pay

## 2024-08-05 ENCOUNTER — Ambulatory Visit (INDEPENDENT_AMBULATORY_CARE_PROVIDER_SITE_OTHER): Admitting: Family Medicine

## 2024-08-05 VITALS — BP 132/84 | HR 54 | Ht 68.0 in | Wt 238.0 lb

## 2024-08-05 DIAGNOSIS — M25562 Pain in left knee: Secondary | ICD-10-CM

## 2024-08-05 DIAGNOSIS — M1712 Unilateral primary osteoarthritis, left knee: Secondary | ICD-10-CM | POA: Diagnosis not present

## 2024-08-05 DIAGNOSIS — G8929 Other chronic pain: Secondary | ICD-10-CM | POA: Diagnosis not present

## 2024-08-05 MED ORDER — IBUPROFEN 800 MG PO TABS: 800.0000 mg | ORAL_TABLET | Freq: Three times a day (TID) | ORAL | 0 refills | Status: AC | PRN

## 2024-08-05 MED ORDER — TIZANIDINE HCL 4 MG PO TABS: 4.0000 mg | ORAL_TABLET | Freq: Three times a day (TID) | ORAL | 1 refills | Status: AC | PRN

## 2024-08-05 NOTE — Telephone Encounter (Signed)
 Benefits ran for left knee monovisc Case [id number]

## 2024-08-05 NOTE — Telephone Encounter (Signed)
 Pt called back requesting the CPT of the medications. Code provided along w/ the procedure code.

## 2024-08-05 NOTE — Patient Instructions (Addendum)
 Thank you for coming in today.   We will work to get the MRI approved by your insurance  We will try to get the single dose gel shot authorized.

## 2024-08-05 NOTE — Telephone Encounter (Signed)
 Please auth SINGLE dose gel shot, L knee

## 2024-08-05 NOTE — Telephone Encounter (Signed)
 Unfortunately patient has [employer] and they do not cover any gel or zilretta . Unsure how Dr. Joane wants to move on

## 2024-08-05 NOTE — Telephone Encounter (Signed)
 MRI was ordered at time of visit

## 2024-08-06 ENCOUNTER — Telehealth: Payer: Self-pay

## 2024-08-06 NOTE — Telephone Encounter (Signed)
 Patient called back at 4:30 looking to follow up because she had not heard anything. Patient asked if she was to come in at 7:30 in the morning. I advised her that we did not have any update from insurance at this time and I did not have an update to provide for her in regards to coming in at 7:30 in the morning. Please advise

## 2024-08-06 NOTE — Telephone Encounter (Signed)
 Thank you :)

## 2024-08-06 NOTE — Telephone Encounter (Signed)
 Called insurance and was told the exact same thing that flex forward told me. Hyaluronic acid injection are not covered under medical plan. Flex forward talked to [facility] Reference # [id number].  I talked to Rose B. Reference # [id number] who told me the exact same information and then looked up the call that flex forward made and confirmed that this is not a covered drug under the medical plan. This is also not covered under pharmacy benefits.   However Euell stopped by and said he is going to look into something on their end because there is a way to do something through [facility] pharmacy where there is some sort of deal. He will get back to me.   This will not be able to be done by tomorrow though.   If patient is dead set on getting hyaluronic acid injection she can get it but must pay full price at the time of her visit.

## 2024-08-06 NOTE — Telephone Encounter (Signed)
 error

## 2024-08-07 NOTE — Telephone Encounter (Signed)
 I don't know if patient is still interested in getting these injections, but I have filled out the pharmacy form that [employer] has sent to us . They use [facility] if approved maybe patient can get it delivered to her house in [facility] and get the injection done by her other doctor out there that patient sees for her knee.

## 2024-08-07 NOTE — Telephone Encounter (Signed)
 Pt called again at 7:31 this morning. Informed her nothing had changed since her call late yesterday.  Please call pt, maybe send her the form that shows insurance does not cover to help ease her confusion? She is very nice but needs to clarity.

## 2024-08-17 ENCOUNTER — Ambulatory Visit: Admitting: Orthopaedic Surgery

## 2024-08-25 ENCOUNTER — Encounter: Payer: Self-pay | Admitting: Family Medicine

## 2024-08-25 DIAGNOSIS — M1712 Unilateral primary osteoarthritis, left knee: Secondary | ICD-10-CM

## 2024-08-25 DIAGNOSIS — G8929 Other chronic pain: Secondary | ICD-10-CM

## 2024-08-25 NOTE — Telephone Encounter (Signed)
 Forwarding to Dr. Denyse Amass to review and advise.

## 2024-08-27 NOTE — Telephone Encounter (Signed)
 I was able to find the report.  It was not sent to me automatically thank you for letting me know it has completed.  MRI as you are aware does show medium arthritis and a meniscus tear.  The meniscus tear is probably why the pain is so much worse.  Next step could be surgery consultation or we can get the gel injection done.  What would you like to consider next?

## 2024-09-02 NOTE — Telephone Encounter (Signed)
 Pt called to discuss options. Thought we could not get the gel approved for her so she is not sure what the plan is.

## 2024-09-03 ENCOUNTER — Telehealth: Payer: Self-pay | Admitting: Family Medicine

## 2024-09-03 NOTE — Telephone Encounter (Addendum)
 Patient called and is wanting to speak to a nurse regarding the cost of the gel injection. She asked that if she could not get the gel injection, could she get another cortisone shot. Please advise.

## 2024-09-07 ENCOUNTER — Ambulatory Visit: Admitting: Family Medicine

## 2024-09-07 NOTE — Telephone Encounter (Signed)
 OOP cost for single gel shots can range from $600-$1600 per dose.   OK to repeat standard steroid injection?

## 2024-09-08 NOTE — Telephone Encounter (Signed)
 Per Dr. Joane, OK to repeat standard steroid shot.   Please reach out to pt to assist with scheduling, if she would like to proceed with standard steroid injection.   Thank you!

## 2024-09-08 NOTE — Telephone Encounter (Signed)
 We can repeat a cortisone shot

## 2024-09-22 NOTE — Addendum Note (Signed)
 Addended by: GEROME ILEANA RAMAN on: 09/22/2024 01:17 PM   Modules accepted: Orders

## 2024-09-22 NOTE — Telephone Encounter (Signed)
 Pt called asking if we could get her a copy of her XR results. I advised that the XR was not done at our office, but she could call [facility] office and get the images onto a disk. Also advised that she could print the XR report through her MyChart. Walked her through the steps to print. Also advised that the new orthopedic surgeon would likely get a new XR regardless. Advised she could call the surgeon's office and see if the can access images through PACS. Pt verbalized understanding.

## 2024-09-28 ENCOUNTER — Encounter: Payer: Self-pay | Admitting: Radiology

## 2024-10-07 ENCOUNTER — Ambulatory Visit: Admitting: Orthopaedic Surgery
